# Patient Record
Sex: Male | Born: 1955 | Race: White | Hispanic: No | Smoking: Former smoker
Health system: Southern US, Community
[De-identification: ages and names within clinical notes are randomized; demographics above are authoritative.]

## PROBLEM LIST (undated history)

## (undated) DIAGNOSIS — I739 Peripheral vascular disease, unspecified: Secondary | ICD-10-CM

## (undated) DIAGNOSIS — N529 Male erectile dysfunction, unspecified: Secondary | ICD-10-CM

## (undated) DIAGNOSIS — E785 Hyperlipidemia, unspecified: Secondary | ICD-10-CM

## (undated) DIAGNOSIS — T7840XA Allergy, unspecified, initial encounter: Secondary | ICD-10-CM

## (undated) DIAGNOSIS — J301 Allergic rhinitis due to pollen: Secondary | ICD-10-CM

## (undated) DIAGNOSIS — I6522 Occlusion and stenosis of left carotid artery: Secondary | ICD-10-CM

## (undated) DIAGNOSIS — G459 Transient cerebral ischemic attack, unspecified: Secondary | ICD-10-CM

## (undated) DIAGNOSIS — I10 Essential (primary) hypertension: Secondary | ICD-10-CM

## (undated) HISTORY — PX: BACK SURGERY: SHX140

## (undated) HISTORY — DX: Male erectile dysfunction, unspecified: N52.9

## (undated) HISTORY — DX: Essential (primary) hypertension: I10

## (undated) HISTORY — PX: EYE SURGERY: SHX253

## (undated) HISTORY — DX: Hyperlipidemia, unspecified: E78.5

## (undated) HISTORY — DX: Allergy, unspecified, initial encounter: T78.40XA

## (undated) HISTORY — PX: FRACTURE SURGERY: SHX138

---

## 1976-04-22 HISTORY — PX: COSMETIC SURGERY: SHX468

## 2004-02-08 ENCOUNTER — Ambulatory Visit: Payer: Self-pay | Admitting: Family Medicine

## 2004-03-23 ENCOUNTER — Ambulatory Visit: Payer: Self-pay | Admitting: Family Medicine

## 2004-06-05 ENCOUNTER — Ambulatory Visit: Payer: Self-pay | Admitting: Family Medicine

## 2004-08-14 ENCOUNTER — Ambulatory Visit: Payer: Self-pay | Admitting: Family Medicine

## 2005-10-22 ENCOUNTER — Ambulatory Visit: Payer: Self-pay | Admitting: Internal Medicine

## 2006-01-27 ENCOUNTER — Ambulatory Visit: Payer: Self-pay | Admitting: Family Medicine

## 2006-03-05 ENCOUNTER — Ambulatory Visit: Payer: Self-pay | Admitting: Family Medicine

## 2006-08-05 ENCOUNTER — Encounter: Payer: Self-pay | Admitting: Family Medicine

## 2006-11-18 ENCOUNTER — Ambulatory Visit: Payer: Self-pay | Admitting: Family Medicine

## 2006-11-18 DIAGNOSIS — J019 Acute sinusitis, unspecified: Secondary | ICD-10-CM | POA: Insufficient documentation

## 2006-11-18 DIAGNOSIS — J309 Allergic rhinitis, unspecified: Secondary | ICD-10-CM | POA: Insufficient documentation

## 2007-02-02 ENCOUNTER — Telehealth (INDEPENDENT_AMBULATORY_CARE_PROVIDER_SITE_OTHER): Payer: Self-pay | Admitting: *Deleted

## 2007-02-19 ENCOUNTER — Ambulatory Visit: Payer: Self-pay | Admitting: Family Medicine

## 2007-02-25 ENCOUNTER — Telehealth: Payer: Self-pay | Admitting: Family Medicine

## 2007-03-31 ENCOUNTER — Telehealth: Payer: Self-pay | Admitting: Family Medicine

## 2007-04-07 ENCOUNTER — Ambulatory Visit: Payer: Self-pay | Admitting: Family Medicine

## 2007-06-03 ENCOUNTER — Telehealth: Payer: Self-pay | Admitting: Family Medicine

## 2008-03-16 ENCOUNTER — Ambulatory Visit: Payer: Self-pay | Admitting: Family Medicine

## 2008-03-16 DIAGNOSIS — J029 Acute pharyngitis, unspecified: Secondary | ICD-10-CM | POA: Insufficient documentation

## 2008-03-16 LAB — CONVERTED CEMR LAB: Rapid Strep: NEGATIVE

## 2008-04-06 ENCOUNTER — Ambulatory Visit: Payer: Self-pay | Admitting: Family Medicine

## 2008-04-06 LAB — CONVERTED CEMR LAB
OCCULT 1: NEGATIVE
OCCULT 2: NEGATIVE
OCCULT 3: NEGATIVE

## 2009-02-23 ENCOUNTER — Ambulatory Visit: Payer: Self-pay | Admitting: Family Medicine

## 2009-02-23 LAB — CONVERTED CEMR LAB
Bilirubin Urine: NEGATIVE
Glucose, Urine, Semiquant: NEGATIVE
Ketones, urine, test strip: NEGATIVE
Nitrite: NEGATIVE
Protein, U semiquant: NEGATIVE
Specific Gravity, Urine: 1.015
Urobilinogen, UA: 0.2
WBC Urine, dipstick: NEGATIVE
pH: 6.5

## 2009-02-24 LAB — CONVERTED CEMR LAB
ALT: 44 units/L (ref 0–53)
AST: 34 units/L (ref 0–37)
Albumin: 4.1 g/dL (ref 3.5–5.2)
Alkaline Phosphatase: 75 units/L (ref 39–117)
BUN: 8 mg/dL (ref 6–23)
Basophils Absolute: 0 10*3/uL (ref 0.0–0.1)
Basophils Relative: 0.8 % (ref 0.0–3.0)
Bilirubin, Direct: 0 mg/dL (ref 0.0–0.3)
CO2: 28 meq/L (ref 19–32)
Calcium: 9 mg/dL (ref 8.4–10.5)
Chloride: 108 meq/L (ref 96–112)
Cholesterol: 192 mg/dL (ref 0–200)
Creatinine, Ser: 0.9 mg/dL (ref 0.4–1.5)
Eosinophils Absolute: 0.2 10*3/uL (ref 0.0–0.7)
Eosinophils Relative: 2.9 % (ref 0.0–5.0)
GFR calc non Af Amer: 93.49 mL/min (ref >60)
Glucose, Bld: 93 mg/dL (ref 70–99)
HCT: 44.8 % (ref 39.0–52.0)
HDL: 43.3 mg/dL (ref >39.00)
Hemoglobin: 15.3 g/dL (ref 13.0–17.0)
LDL Cholesterol: 131 mg/dL — ABNORMAL HIGH (ref 0–99)
Lymphocytes Relative: 35.8 % (ref 12.0–46.0)
Lymphs Abs: 1.9 10*3/uL (ref 0.7–4.0)
MCHC: 34.1 g/dL (ref 30.0–36.0)
MCV: 93.9 fL (ref 78.0–100.0)
Monocytes Absolute: 0.6 10*3/uL (ref 0.1–1.0)
Monocytes Relative: 11 % (ref 3.0–12.0)
Neutro Abs: 2.6 10*3/uL (ref 1.4–7.7)
Neutrophils Relative %: 49.5 % (ref 43.0–77.0)
PSA: 1.23 ng/mL (ref 0.10–4.00)
Platelets: 227 10*3/uL (ref 150.0–400.0)
Potassium: 4 meq/L (ref 3.5–5.1)
RBC: 4.77 M/uL (ref 4.22–5.81)
RDW: 12.4 % (ref 11.5–14.6)
Sodium: 142 meq/L (ref 135–145)
TSH: 1.19 microintl units/mL (ref 0.35–5.50)
Total Bilirubin: 0.8 mg/dL (ref 0.3–1.2)
Total CHOL/HDL Ratio: 4
Total Protein: 7.2 g/dL (ref 6.0–8.3)
Triglycerides: 90 mg/dL (ref 0.0–149.0)
VLDL: 18 mg/dL (ref 0.0–40.0)
WBC: 5.3 10*3/uL (ref 4.5–10.5)

## 2009-03-08 ENCOUNTER — Ambulatory Visit: Payer: Self-pay | Admitting: Family Medicine

## 2009-04-11 ENCOUNTER — Ambulatory Visit: Payer: Self-pay | Admitting: Family Medicine

## 2009-04-11 DIAGNOSIS — J069 Acute upper respiratory infection, unspecified: Secondary | ICD-10-CM | POA: Insufficient documentation

## 2009-04-11 LAB — CONVERTED CEMR LAB: Rapid Strep: NEGATIVE

## 2009-07-07 ENCOUNTER — Encounter (INDEPENDENT_AMBULATORY_CARE_PROVIDER_SITE_OTHER): Payer: Self-pay | Admitting: *Deleted

## 2009-08-15 ENCOUNTER — Encounter (INDEPENDENT_AMBULATORY_CARE_PROVIDER_SITE_OTHER): Payer: Self-pay | Admitting: *Deleted

## 2009-08-25 ENCOUNTER — Encounter (INDEPENDENT_AMBULATORY_CARE_PROVIDER_SITE_OTHER): Payer: Self-pay | Admitting: *Deleted

## 2009-08-28 ENCOUNTER — Ambulatory Visit: Payer: Self-pay | Admitting: Gastroenterology

## 2009-09-13 ENCOUNTER — Ambulatory Visit: Payer: Self-pay | Admitting: Gastroenterology

## 2009-09-13 HISTORY — PX: COLONOSCOPY: SHX174

## 2010-05-22 NOTE — Procedures (Signed)
Summary: Colonoscopy  Patient: [name] Note: All result statuses are Final unless otherwise noted.  Tests: (1) Colonoscopy (COL)   COL Colonoscopy           DONE     [facility]     [address]     [place], [place]  [address]           COLONOSCOPY PROCEDURE REPORT           PATIENT:  Dean Alvarado, Dean Alvarado  MR#:  [id number]     BIRTHDATE:  16-Nov-1955, 53 yrs. old  GENDER:  male     ENDOSCOPIST:  Vania Rea. Jarold Motto, MD, [facility]     REF. BY:  Tera Mater. Clent Ridges, M.D.     PROCEDURE DATE:  09/13/2009     PROCEDURE:  Average-risk screening colonoscopy     G0121     ASA CLASS:  Class I     INDICATIONS:  colorectal cancer screening, average risk     MEDICATIONS:   Fentanyl 75 mcg IV, Versed 8 mg IV           DESCRIPTION OF PROCEDURE:   After the risks benefits and     alternatives of the procedure were thoroughly explained, informed     consent was obtained.  Digital rectal exam was performed and     revealed no abnormalities.   The [id number] endoscope was     introduced through the anus and advanced to the cecum, which was     identified by both the appendix and ileocecal valve, without     limitations.  The quality of the prep was excellent, using     MoviPrep.  The instrument was then slowly withdrawn as the colon     was fully examined.     <<PROCEDUREIMAGES>>           FINDINGS:  Moderate diverticulosis was found in the sigmoid to     descending colon segments. thickened,red haustral folds noted.  No     polyps or cancers were seen.  This was otherwise a normal     examination of the colon.   Retroflexed views in the rectum     revealed no abnormalities.    The scope was then withdrawn from     the patient and the procedure completed.           COMPLICATIONS:  None     ENDOSCOPIC IMPRESSION:     1) Moderate diverticulosis in the sigmoid to descending colon     segments     2) No polyps or cancers     3) Otherwise normal examination     RECOMMENDATIONS:     1) high  fiber diet     2) Continue current colorectal screening recommendations for     "routine risk" patients with a repeat colonoscopy in 10 years.     REPEAT EXAM:  No           ______________________________     Vania Rea. Jarold Motto, MD, [facility]           CC:           n.     eSIGNED:   Vania Rea. Maryetta Shafer at 09/13/2009 10:13 AM           Sharene Skeans, [id number]  Note: An exclamation mark (!) indicates a result that was not dispersed into the flowsheet. Document Creation Date: 09/13/2009 10:14 AM _______________________________________________________________________  (1) Order result status: Final Collection  or observation date-time: 09/13/2009 10:08 Requested date-time:  Receipt date-time:  Reported date-time:  Referring Physician:   Ordering Physician: Sheryn Bison [phone]) Specimen Source:  Source: Launa Grill Order Number: [id number] Lab site:   Appended Document: Colonoscopy    Clinical Lists Changes  Observations: Added new observation of COLONNXTDUE: 08/2019 (09/13/2009 14:44)

## 2010-05-22 NOTE — Letter (Signed)
Summary: Previsit letter  [name]  [address] [place], [place] [address]   Phone: [phone]  Fax: [phone]       08/15/2009 MRN: [id number]  Dean Alvarado [address]  [place] [address] [place], [place]  [address]  Dear Mr. [name],  Welcome to the Gastroenterology Division at [facility].    You are scheduled to see a nurse for your pre-procedure visit on 08/28/2009 at 1:00PM on the 3rd floor at [facility], [address]  We ask that you try to arrive at our office 15 minutes prior to your appointment time to allow for check-in.  Your nurse visit will consist of discussing your medical and surgical history, your immediate family medical history, and your medications.    Please bring a complete list of all your medications or, if you prefer, bring the medication bottles and we will list them.  We will need to be aware of both prescribed and over the counter drugs.  We will need to know exact dosage information as well.  If you are on blood thinners (Coumadin, Plavix, Aggrenox, Ticlid, etc.) please call our office today/prior to your appointment, as we need to consult with your physician about holding your medication.   Please be prepared to read and sign documents such as consent forms, a financial agreement, and acknowledgement forms.  If necessary, and with your consent, a friend or relative is welcome to sit-in on the nurse visit with you.  Please bring your insurance card so that we may make a copy of it.  If your insurance requires a referral to see a specialist, please bring your referral form from your primary care physician.  No co-pay is required for this nurse visit.     If you cannot keep your appointment, please call [phone] to cancel or reschedule prior to your appointment date.  This allows [place] the opportunity to schedule an appointment for another patient in need of care.    Thank you for choosing [facility] for your medical needs.   We appreciate the opportunity to care for you.  Please visit [place] at our website  to learn more about our practice.                     Sincerely.                                                                                                                   The Gastroenterology Division

## 2010-05-22 NOTE — Miscellaneous (Signed)
Summary: [facility]  Clinical Lists Changes  Medications: Added new medication of MOVIPREP 100 GM  SOLR (PEG-KCL-NACL-NASULF-NA ASC-C) As per prep instructions. - Signed Rx of MOVIPREP 100 GM  SOLR (PEG-KCL-NACL-NASULF-NA ASC-C) As per prep instructions.;  #1 x 0;  Signed;  Entered by: Ezra Sites RN;  Authorized by: Mardella Layman MD [facility];  Method used: Electronically to [facility] [address] [facility] [address] # [phone]*, [address] [place], [place]  [address], Ph: [phone], Fax: [phone] Observations: Added new observation of NKA: T (08/28/2009 13:00)    Prescriptions: MOVIPREP 100 GM  SOLR (PEG-KCL-NACL-NASULF-NA ASC-C) As per prep instructions.  #1 x 0   Entered by:   Ezra Sites RN   Authorized by:   Mardella Layman MD [facility]   Signed by:   Ezra Sites RN on 08/28/2009   Method used:   Electronically to        [facility] # [phone]* (retail)       [address]       [place], [place]  [address]       Ph: [phone]       Fax: [phone]   RxID:   [id number]

## 2010-05-22 NOTE — Letter (Signed)
Summary: Referral - not able to see patient  [facility]  [address] [place], [place] [address]   Phone: [phone]  Fax: [phone]    July 07, 2009    Tera Mater. Clent Ridges, M.D. [address] [place], [place] [address]   Re:   RUARI MUDGETT DOB:  March 18, 1956 MRN:   [id number]    Dear Dr. Clent Ridges:  Thank you for your kind referral of the above patient.  We have attempted to schedule the recommended procedure Screening Colonoscopy but have not been able to schedule because:  ___ The patient was not available by phone and/or has not returned our calls.   X  The patient declined to schedule the procedure at this time.  We appreciate the referral and hope that we will have the opportunity to treat this patient in the future.    Sincerely,    [facility] [phone]

## 2010-05-22 NOTE — Letter (Signed)
Summary: [facility] Instructions  [facility]  [address] [place], [place] [address]   Phone: [phone]  Fax: [phone]       Dean Alvarado    11/09/1955    MRN: [id number]        Procedure Day Dorna Bloom:  Wednesday 09/13/2009     Arrival Time: 8:30 am      Procedure Time: 9:30 am     Location of Procedure:                    _x _  [facility] (4th Floor)                        PREPARATION FOR COLONOSCOPY WITH MOVIPREP   Starting 5 days prior to your procedure Friday 5/20 do not eat nuts, seeds, popcorn, corn, beans, peas,  salads, or any raw vegetables.  Do not take any fiber supplements (e.g. Metamucil, Citrucel, and Benefiber).  THE DAY BEFORE YOUR PROCEDURE         DATE: Tuesday 5/24  1.  Drink clear liquids the entire day-NO SOLID FOOD  2.  Do not drink anything colored red or purple.  Avoid juices with pulp.  No orange juice.  3.  Drink at least 64 oz. (8 glasses) of fluid/clear liquids during the day to prevent dehydration and help the prep work efficiently.  CLEAR LIQUIDS INCLUDE: Water Jello Ice Popsicles Tea (sugar ok, no milk/cream) Powdered fruit flavored drinks Coffee (sugar ok, no milk/cream) Gatorade Juice: apple, white grape, white cranberry  Lemonade Clear bullion, consomm, broth Carbonated beverages (any kind) Strained chicken noodle soup Hard Candy                             4.  In the morning, mix first dose of MoviPrep solution:    Empty 1 Pouch A and 1 Pouch B into the disposable container    Add lukewarm drinking water to the top line of the container. Mix to dissolve    Refrigerate (mixed solution should be used within 24 hrs)  5.  Begin drinking the prep at 5:00 p.m. The MoviPrep container is divided by 4 marks.   Every 15 minutes drink the solution down to the next mark (approximately 8 oz) until the full liter is complete.   6.  Follow completed prep with 16 oz of clear liquid of your choice (Nothing  red or purple).  Continue to drink clear liquids until bedtime.  7.  Before going to bed, mix second dose of MoviPrep solution:    Empty 1 Pouch A and 1 Pouch B into the disposable container    Add lukewarm drinking water to the top line of the container. Mix to dissolve    Refrigerate  THE DAY OF YOUR PROCEDURE      DATE: Wednesday 5/25  Beginning at 4:30 a.m. (5 hours before procedure):         1. Every 15 minutes, drink the solution down to the next mark (approx 8 oz) until the full liter is complete.  2. Follow completed prep with 16 oz. of clear liquid of your choice.    3. You may drink clear liquids until 7:30 am (2 HOURS BEFORE PROCEDURE).   MEDICATION INSTRUCTIONS  Unless otherwise instructed, you should take regular prescription medications with a small sip of water   as early as possible the morning of  your procedure.           OTHER INSTRUCTIONS  You will need a responsible adult at least 56 years of age to accompany you and drive you home.   This person must remain in the waiting room during your procedure.  Wear loose fitting clothing that is easily removed.  Leave jewelry and other valuables at home.  However, you may wish to bring a book to read or  an iPod/MP3 player to listen to music as you wait for your procedure to start.  Remove all body piercing jewelry and leave at home.  Total time from sign-in until discharge is approximately 2-3 hours.  You should go home directly after your procedure and rest.  You can resume normal activities the  day after your procedure.  The day of your procedure you should not:   Drive   Make legal decisions   Operate machinery   Drink alcohol   Return to work  You will receive specific instructions about eating, activities and medications before you leave.    The above instructions have been reviewed and explained to me by   Ezra Sites RN  Aug 28, 2009 1:15 PM     I fully understand and can  verbalize these instructions _____________________________ Date _________

## 2010-06-12 ENCOUNTER — Other Ambulatory Visit: Payer: Self-pay

## 2010-06-12 NOTE — Telephone Encounter (Signed)
Notified [facility]  --pt had rx there with 5 refills spoke with pharmacist stated he had this rx for [facility]epam [facility] and now [facility] [place] is requesting new rx

## 2010-06-13 MED ORDER — LORAZEPAM 1 MG PO TABS: 1.0000 mg | ORAL_TABLET | Freq: Four times a day (QID) | ORAL | Status: DC | PRN

## 2010-06-13 NOTE — Telephone Encounter (Signed)
Lorazepam called in for 6 months [facility]

## 2010-06-13 NOTE — Telephone Encounter (Signed)
Please call in a new 6 month supply

## 2010-09-07 NOTE — Assessment & Plan Note (Signed)
[facility] OFFICE NOTE   NAME:Dean Alvarado, Dean Alvarado                      MRN:          [id number]  DATE:01/27/2006                            DOB:          [birth year]    This is a 55 year old gentleman here to establish with our practice.  He has  a couple of complaints.  He had been seeing Dr. Lutricia Horsfall at our [facility] office, but is now transferring here.  First off, he is asking for  refills on his allergy medications.  These have been working well over the  past year.  Also, he thinks he has developed a small sty in his right upper  eyelid.  This first appeared 5 days ago, but was never painful.  It now  seems to be getting smaller over the past couple of days.  He does not wear  contact lenses.   PAST MEDICAL HISTORY:  Otherwise fairly unremarkable.  He has never had a  significant illness.  He has never had surgery.   IMMUNIZATIONS:  He had a tetanus booster in 2005.   ALLERGIES:  None.   CURRENT MEDICATIONS:  1. Nasonex sprays daily.  2. Claritin daily.   HABITS:  He drinks some alcohol.  He does not use tobacco.   SOCIAL HISTORY:  Married with children.  He is a Psychologist, occupational.   FAMILY HISTORY:  Remarkable for hypertension and diabetes.   OBJECTIVE:  VITAL SIGNS:  Height 5 feet, 9 inches, weight 183, blood  pressure 118/88, pulse 72 and regular.  GENERAL:  He appears to be healthy.  He wears glasses.  HEENT:  Examination was limited to his right upper eyelid, which, in fact,  does have a tiny sty along the eyelash line.  There is no redness, swelling  or tenderness.   ASSESSMENT AND PLAN:  1. Wellness. The patient is past due for a physical exam.  We will plan to      set this up sometime soon.  Will also get a colonoscopy set up for him      shortly thereafter.  2. Allergies, stable.  I refilled his medications.  3. Sty.  He can use hot compresses.  At this point, no antibiotic therapy     appears to be needed but he will let me know if it gets worse.            ______________________________  Tera Mater. Clent Ridges, MD     SAF/MedQ  DD:  01/27/2006  DT:  01/28/2006  Job #:  [id number]

## 2010-09-07 NOTE — Assessment & Plan Note (Signed)
[facility] OFFICE NOTE   NAME:Slinker, Dean Alvarado                      MRN:          [id number]  DATE:03/05/2006                            DOB:          1955-08-28    This is a 55 year old gentleman here for a complete physical  examination.  He feels fine and has no complaints at all.  His allergies  remain under good control.  I had an introductory visit with him on  October 8.  I refer to this note concerning details of his past medical  history, family history, social history, habits, etc.  The patient had  complete lab work performed in March of this year including a PSA.  All  of this was within normal limits and we reviewed these at our last  visit.   ALLERGIES:  None.   CURRENT MEDICATIONS:  Nasonex spray daily and Claritin daily.   OBJECTIVE:  VITAL SIGNS:  Height 5 feet 9 inches, weight 186, BP 122/90,  pulse 72 and regular.  GENERAL:  He appears to be healthy.  SKIN:  Clear.  HEENT:  Eyes are clear; he wears glasses.  Ears clear, pharynx clear.  NECK:  Supple without lymphadenopathy or masses.  LUNGS:  Clear.  CARDIAC:  Rate and rhythm are regular without gallops, murmurs, rubs.  Distal pulses are full.  EKG is within normal limits.  ABDOMEN:  Soft, normal bowel sounds, nontender, no masses.  GENITALIA:  Normal male.  He is circumcised.  RECTAL:  No mass or tenderness.  Prostate is within normal limits.  Stool hemoccult negative.  EXTREMITIES:  No clubbing, cyanosis, or edema.  NEUROLOGIC:  Grossly intact.   ASSESSMENT AND PLAN:  Complete physical exam.  In general he appears to  be doing quite well.  I encouraged him to get regular exercise.  He was  given a flu shot today and will set him up for his first colonoscopy.     Tera Mater. Clent Ridges, MD  Electronically Signed    SAF/MedQ  DD: 03/05/2006  DT: 03/05/2006  Job #: [id number]

## 2010-09-21 ENCOUNTER — Other Ambulatory Visit: Payer: Self-pay | Admitting: Family Medicine

## 2010-09-23 NOTE — Telephone Encounter (Signed)
Call in #60 with 5 rf 

## 2010-09-25 NOTE — Telephone Encounter (Signed)
Rx called to pharmacy to [facility]

## 2010-09-27 ENCOUNTER — Telehealth: Payer: Self-pay | Admitting: *Deleted

## 2010-09-27 NOTE — Telephone Encounter (Signed)
Refill on lorazepam 1 mg

## 2010-09-28 NOTE — Telephone Encounter (Signed)
Left message on machine for pharmacy

## 2010-09-28 NOTE — Telephone Encounter (Signed)
NO refills until he sees me

## 2011-01-03 ENCOUNTER — Other Ambulatory Visit: Payer: Self-pay | Admitting: Family Medicine

## 2011-01-03 NOTE — Telephone Encounter (Signed)
Refill request for lorazepam 1 mg take 1 po q6hrs prn. Pt has agreed to schedule a office visit for future refills. It looks like he is due for one.

## 2011-01-03 NOTE — Telephone Encounter (Signed)
LOV 2010 NOV none

## 2011-01-08 NOTE — Telephone Encounter (Signed)
Pt called back again in regards to prescription please contact pt at [phone]

## 2011-01-09 MED ORDER — LORAZEPAM 1 MG PO TABS: 1.0000 mg | ORAL_TABLET | Freq: Four times a day (QID) | ORAL | Status: DC | PRN

## 2011-01-09 NOTE — Telephone Encounter (Signed)
Call in #60 with no rf 

## 2011-01-09 NOTE — Telephone Encounter (Signed)
Pt called to check on status of refill of Lorazepam.

## 2011-01-09 NOTE — Telephone Encounter (Signed)
Pts wife called to check on satus of getting refill of Lorazepam 1 mg take 1 po q6hrs prn. Pls call in to [facility]. Original req was on 01/03/11. Pt has sch an ov for 01/25/11.

## 2011-01-09 NOTE — Telephone Encounter (Signed)
Script called in

## 2011-01-25 ENCOUNTER — Ambulatory Visit: Payer: Self-pay | Admitting: Family Medicine

## 2011-11-18 ENCOUNTER — Ambulatory Visit (INDEPENDENT_AMBULATORY_CARE_PROVIDER_SITE_OTHER): Payer: BC Managed Care – PPO | Admitting: Internal Medicine

## 2011-11-18 ENCOUNTER — Encounter: Payer: Self-pay | Admitting: Internal Medicine

## 2011-11-18 VITALS — BP 142/102 | Temp 98.5°F | Ht 68.5 in | Wt 207.0 lb

## 2011-11-18 DIAGNOSIS — H6120 Impacted cerumen, unspecified ear: Secondary | ICD-10-CM

## 2011-11-18 NOTE — Progress Notes (Signed)
  Subjective:    Patient ID: Dean Alvarado, male    DOB: Sep 23, 1955, 56 y.o.   MRN: [id number]  HPI   56 year old white male with history of periodic cerumen impactions complains of fullness of the left ear.  He also complains of slightly sensation.   He denies significant hearing loss.      Review of Systems See HPI  Past Medical History  Diagnosis Date  . Allergy     History   Social History  . Marital Status: Married    Spouse Name: N/A    Number of Children: N/A  . Years of Education: N/A   Occupational History  . Not on file.   Social History Main Topics  . Smoking status: Current Everyday Smoker  . Smokeless tobacco: Not on file  . Alcohol Use: Yes  . Drug Use: No  . Sexually Active: Not on file   Other Topics Concern  . Not on file   Social History Narrative  . No narrative on file    Past Surgical History  Procedure Date  . Plastic surgery left eye     Family History  Problem Relation Age of Onset  . Diabetes    . Hypertension      No Known Allergies  Current Outpatient Prescriptions on File Prior to Visit  Medication Sig Dispense Refill  . LORazepam (ATIVAN) 1 MG tablet Take 1 tablet (1 mg total) by mouth every 6 (six) hours as needed for anxiety.  60 tablet  0    BP 142/102  Temp 98.5 F (36.9 C) (Oral)  Ht 5' 8.5" (1.74 m)  Wt 207 lb (93.895 kg)  BMI 31.02 kg/m2       Objective:   Physical Exam  Constitutional: He appears well-developed and well-nourished.  HENT:  Head: Normocephalic and atraumatic.        Bilateral cerumen impaction  Cardiovascular: Normal rate, regular rhythm and normal heart sounds.   Pulmonary/Chest: Effort normal and breath sounds normal. He has no wheezes.          Assessment & Plan:

## 2011-11-18 NOTE — Assessment & Plan Note (Signed)
Patient has bilateral cerumen impaction. Irrigated both ears to remove cerumen plug. Patient tolerated well. No complications.

## 2011-11-18 NOTE — Patient Instructions (Addendum)
Use over the counter ear cleaning kit as directed.

## 2012-01-21 ENCOUNTER — Other Ambulatory Visit (INDEPENDENT_AMBULATORY_CARE_PROVIDER_SITE_OTHER): Payer: BC Managed Care – PPO

## 2012-01-21 DIAGNOSIS — Z0000 Encounter for general adult medical examination without abnormal findings: Secondary | ICD-10-CM

## 2012-01-21 LAB — CBC WITH DIFFERENTIAL/PLATELET
Basophils Absolute: 0 10*3/uL (ref 0.0–0.1)
Basophils Relative: 0.9 % (ref 0.0–3.0)
Eosinophils Absolute: 0.1 10*3/uL (ref 0.0–0.7)
Eosinophils Relative: 2.6 % (ref 0.0–5.0)
HCT: 44.7 % (ref 39.0–52.0)
Hemoglobin: 14.9 g/dL (ref 13.0–17.0)
Lymphocytes Relative: 28.9 % (ref 12.0–46.0)
Lymphs Abs: 1.6 10*3/uL (ref 0.7–4.0)
MCHC: 33.4 g/dL (ref 30.0–36.0)
MCV: 93.3 fl (ref 78.0–100.0)
Monocytes Absolute: 0.6 10*3/uL (ref 0.1–1.0)
Monocytes Relative: 9.8 % (ref 3.0–12.0)
Neutro Abs: 3.2 10*3/uL (ref 1.4–7.7)
Neutrophils Relative %: 57.8 % (ref 43.0–77.0)
Platelets: 243 10*3/uL (ref 150.0–400.0)
RBC: 4.79 Mil/uL (ref 4.22–5.81)
RDW: 12.8 % (ref 11.5–14.6)
WBC: 5.6 10*3/uL (ref 4.5–10.5)

## 2012-01-21 LAB — POCT URINALYSIS DIPSTICK
Bilirubin, UA: NEGATIVE
Blood, UA: NEGATIVE
Glucose, UA: NEGATIVE
Ketones, UA: NEGATIVE
Leukocytes, UA: NEGATIVE
Nitrite, UA: NEGATIVE
Protein, UA: NEGATIVE
Spec Grav, UA: 1.01
Urobilinogen, UA: 0.2
pH, UA: 7

## 2012-01-21 LAB — BASIC METABOLIC PANEL
BUN: 18 mg/dL (ref 6–23)
CO2: 26 mEq/L (ref 19–32)
Calcium: 9 mg/dL (ref 8.4–10.5)
Chloride: 105 mEq/L (ref 96–112)
Creatinine, Ser: 0.8 mg/dL (ref 0.4–1.5)
GFR: 107.51 mL/min (ref >60.00)
Glucose, Bld: 115 mg/dL — ABNORMAL HIGH (ref 70–99)
Potassium: 4 mEq/L (ref 3.5–5.1)
Sodium: 138 mEq/L (ref 135–145)

## 2012-01-21 LAB — LIPID PANEL
Cholesterol: 198 mg/dL (ref 0–200)
HDL: 46.4 mg/dL (ref >39.00)
LDL Cholesterol: 128 mg/dL — ABNORMAL HIGH (ref 0–99)
Total CHOL/HDL Ratio: 4
Triglycerides: 120 mg/dL (ref 0.0–149.0)
VLDL: 24 mg/dL (ref 0.0–40.0)

## 2012-01-21 LAB — HEPATIC FUNCTION PANEL
ALT: 34 U/L (ref 0–53)
AST: 26 U/L (ref 0–37)
Albumin: 4 g/dL (ref 3.5–5.2)
Alkaline Phosphatase: 79 U/L (ref 39–117)
Bilirubin, Direct: 0.2 mg/dL (ref 0.0–0.3)
Total Bilirubin: 1 mg/dL (ref 0.3–1.2)
Total Protein: 7.1 g/dL (ref 6.0–8.3)

## 2012-01-21 LAB — TSH: TSH: 1.17 u[IU]/mL (ref 0.35–5.50)

## 2012-01-21 LAB — PSA: PSA: 1.06 ng/mL (ref 0.10–4.00)

## 2012-01-22 NOTE — Progress Notes (Signed)
Quick Note:  I left voice message with results. ______ 

## 2012-01-28 ENCOUNTER — Encounter: Payer: Self-pay | Admitting: Family Medicine

## 2012-01-28 ENCOUNTER — Ambulatory Visit (INDEPENDENT_AMBULATORY_CARE_PROVIDER_SITE_OTHER): Payer: BC Managed Care – PPO | Admitting: Family Medicine

## 2012-01-28 VITALS — BP 124/90 | HR 80 | Temp 98.7°F | Ht 68.5 in | Wt 202.0 lb

## 2012-01-28 DIAGNOSIS — Z0000 Encounter for general adult medical examination without abnormal findings: Secondary | ICD-10-CM

## 2012-01-28 DIAGNOSIS — Z23 Encounter for immunization: Secondary | ICD-10-CM

## 2012-01-28 MED ORDER — LORAZEPAM 1 MG PO TABS: 1.0000 mg | ORAL_TABLET | Freq: Four times a day (QID) | ORAL | Status: DC | PRN

## 2012-01-28 NOTE — Progress Notes (Signed)
  Subjective:    Patient ID: Dean Alvarado, male    DOB: 1955-08-14, 56 y.o.   MRN: [id number]  HPI 56 yr old male for a cpx. He feels well and has no concerns.    Review of Systems  Constitutional: Negative.   HENT: Negative.   Eyes: Negative.   Respiratory: Negative.   Cardiovascular: Negative.   Gastrointestinal: Negative.   Genitourinary: Negative.   Musculoskeletal: Negative.   Skin: Negative.   Neurological: Negative.   Hematological: Negative.   Psychiatric/Behavioral: Negative.        Objective:   Physical Exam  Constitutional: He is oriented to person, place, and time. He appears well-developed and well-nourished. No distress.  HENT:  Head: Normocephalic and atraumatic.  Right Ear: External ear normal.  Left Ear: External ear normal.  Nose: Nose normal.  Mouth/Throat: Oropharynx is clear and moist. No oropharyngeal exudate.  Eyes: Conjunctivae normal and EOM are normal. Pupils are equal, round, and reactive to light. Right eye exhibits no discharge. Left eye exhibits no discharge. No scleral icterus.  Neck: Neck supple. No JVD present. No tracheal deviation present. No thyromegaly present.  Cardiovascular: Normal rate, regular rhythm, normal heart sounds and intact distal pulses.  Exam reveals no gallop and no friction rub.   No murmur heard.      EKG normal with a single PVC  Pulmonary/Chest: Effort normal and breath sounds normal. No respiratory distress. He has no wheezes. He has no rales. He exhibits no tenderness.  Abdominal: Soft. Bowel sounds are normal. He exhibits no distension and no mass. There is no tenderness. There is no rebound and no guarding.  Genitourinary: Rectum normal, prostate normal and penis normal. Guaiac negative stool. No penile tenderness.  Musculoskeletal: Normal range of motion. He exhibits no edema and no tenderness.  Lymphadenopathy:    He has no cervical adenopathy.  Neurological: He is alert and oriented to person, place, and  time. He has normal reflexes. No cranial nerve deficit. He exhibits normal muscle tone. Coordination normal.  Skin: Skin is warm and dry. No rash noted. He is not diaphoretic. No erythema. No pallor.  Psychiatric: He has a normal mood and affect. His behavior is normal. Judgment and thought content normal.          Assessment & Plan:  Well exam.

## 2012-08-18 ENCOUNTER — Other Ambulatory Visit: Payer: Self-pay | Admitting: Family Medicine

## 2012-08-18 NOTE — Telephone Encounter (Signed)
Call in #60 with 5 rf 

## 2013-01-12 ENCOUNTER — Encounter: Payer: Self-pay | Admitting: Family Medicine

## 2013-01-12 ENCOUNTER — Ambulatory Visit (INDEPENDENT_AMBULATORY_CARE_PROVIDER_SITE_OTHER): Payer: BC Managed Care – PPO | Admitting: Family Medicine

## 2013-01-12 VITALS — BP 142/100 | HR 76 | Temp 98.4°F | Wt 205.0 lb

## 2013-01-12 DIAGNOSIS — R109 Unspecified abdominal pain: Secondary | ICD-10-CM

## 2013-01-12 LAB — POCT URINALYSIS DIPSTICK
Bilirubin, UA: NEGATIVE
Glucose, UA: NEGATIVE
Ketones, UA: NEGATIVE
Leukocytes, UA: NEGATIVE
Nitrite, UA: NEGATIVE
Spec Grav, UA: 1.02
Urobilinogen, UA: 0.2
pH, UA: 6.5

## 2013-01-12 NOTE — Progress Notes (Signed)
  Subjective:    Patient ID: Dean Alvarado, male    DOB: 1955-04-30, 57 y.o.   MRN: [id number]  HPI Here for a dull pain in the right flank that started 3 weeks ago. It waxes and wanes but is constant. He rates it a 3 out of 10 as far as severity. No other sx at all, no change in BMs or urinations. No nausea or fever. He does work out at [employer] by running on a treadmill but he does not lift weights. Advil helps.    Review of Systems  Constitutional: Negative.   Respiratory: Negative.   Cardiovascular: Negative.   Gastrointestinal: Negative.   Genitourinary: Positive for flank pain. Negative for dysuria, urgency, frequency, hematuria and difficulty urinating.       Objective:   Physical Exam  Constitutional: He appears well-developed and well-nourished. No distress.  Cardiovascular: Normal rate, regular rhythm, normal heart sounds and intact distal pulses.   Pulmonary/Chest: Effort normal and breath sounds normal.  Abdominal: Soft. Bowel sounds are normal. He exhibits no distension and no mass. There is no tenderness. There is no rebound and no guarding.  Musculoskeletal: Normal range of motion. He exhibits no edema and no tenderness.  I cannot elicit any tenderness           Assessment & Plan:  Flank pain of unclear etiology, probably muscular. Try heat and Advil prn. He will return in 3-4 weeks and we will address this again at that time.

## 2013-01-12 NOTE — Addendum Note (Signed)
Addended by: Aniceto Boss A on: 01/12/2013 11:17 AM   Modules accepted: Orders

## 2013-03-23 ENCOUNTER — Encounter: Payer: Self-pay | Admitting: Family Medicine

## 2013-03-23 ENCOUNTER — Ambulatory Visit (INDEPENDENT_AMBULATORY_CARE_PROVIDER_SITE_OTHER): Payer: BC Managed Care – PPO | Admitting: Family Medicine

## 2013-03-23 VITALS — BP 140/96 | HR 88 | Temp 98.3°F | Ht 68.0 in | Wt 199.0 lb

## 2013-03-23 DIAGNOSIS — R7309 Other abnormal glucose: Secondary | ICD-10-CM

## 2013-03-23 DIAGNOSIS — Z23 Encounter for immunization: Secondary | ICD-10-CM

## 2013-03-23 DIAGNOSIS — R739 Hyperglycemia, unspecified: Secondary | ICD-10-CM | POA: Insufficient documentation

## 2013-03-23 DIAGNOSIS — Z0000 Encounter for general adult medical examination without abnormal findings: Secondary | ICD-10-CM

## 2013-03-23 LAB — PSA: PSA: 1.27 ng/mL (ref 0.10–4.00)

## 2013-03-23 LAB — HEMOGLOBIN A1C: Hgb A1c MFr Bld: 5.9 % (ref 4.6–6.5)

## 2013-03-23 MED ORDER — LORAZEPAM 1 MG PO TABS: ORAL_TABLET | ORAL | Status: DC

## 2013-03-23 NOTE — Progress Notes (Signed)
   Subjective:    Patient ID: Dean Alvarado, male    DOB: Feb 22, 1956, 57 y.o.   MRN: [id number]  HPI 57 yr old male for a cpx. He feels well in general but he has been under a lot of stress lately. He and his wife have been separated and the divorce will probably be finalized in the next 6 months. His estranged wife plans to move to [place]. He thinks this is why his BP is up a little. He had recent labs during a health fair at his job, and his fasting glucose has gone up to around 120.    Review of Systems  Constitutional: Negative.   HENT: Negative.   Eyes: Negative.   Respiratory: Negative.   Cardiovascular: Negative.   Gastrointestinal: Negative.   Genitourinary: Negative.   Musculoskeletal: Negative.   Skin: Negative.   Neurological: Negative.   Psychiatric/Behavioral: Negative.        Objective:   Physical Exam  Constitutional: He is oriented to person, place, and time. He appears well-developed and well-nourished. No distress.  HENT:  Head: Normocephalic and atraumatic.  Right Ear: External ear normal.  Left Ear: External ear normal.  Nose: Nose normal.  Mouth/Throat: Oropharynx is clear and moist. No oropharyngeal exudate.  Eyes: Conjunctivae and EOM are normal. Pupils are equal, round, and reactive to light. Right eye exhibits no discharge. Left eye exhibits no discharge. No scleral icterus.  Neck: Neck supple. No JVD present. No tracheal deviation present. No thyromegaly present.  Cardiovascular: Normal rate, regular rhythm, normal heart sounds and intact distal pulses.  Exam reveals no gallop and no friction rub.   No murmur heard. EKG normal   Pulmonary/Chest: Effort normal and breath sounds normal. No respiratory distress. He has no wheezes. He has no rales. He exhibits no tenderness.  Abdominal: Soft. Bowel sounds are normal. He exhibits no distension and no mass. There is no tenderness. There is no rebound and no guarding.  Genitourinary: Rectum normal, prostate  normal and penis normal. Guaiac negative stool. No penile tenderness.  Musculoskeletal: Normal range of motion. He exhibits no edema and no tenderness.  Lymphadenopathy:    He has no cervical adenopathy.  Neurological: He is alert and oriented to person, place, and time. He has normal reflexes. No cranial nerve deficit. He exhibits normal muscle tone. Coordination normal.  Skin: Skin is warm and dry. No rash noted. He is not diaphoretic. No erythema. No pallor.  Psychiatric: He has a normal mood and affect. His behavior is normal. Judgment and thought content normal.          Assessment & Plan:  Well exam. Get a PSA and A1c today. We discussed getting exercise and eating smarter to lose weight.

## 2013-03-23 NOTE — Progress Notes (Signed)
Pre visit review using our clinic review tool, if applicable. No additional management support is needed unless otherwise documented below in the visit note. 

## 2013-09-22 ENCOUNTER — Other Ambulatory Visit: Payer: Self-pay | Admitting: Family Medicine

## 2013-09-23 NOTE — Telephone Encounter (Signed)
Call in #60 with 5 rf 

## 2014-03-28 ENCOUNTER — Other Ambulatory Visit: Payer: Self-pay | Admitting: Family Medicine

## 2014-03-29 ENCOUNTER — Other Ambulatory Visit: Payer: Self-pay | Admitting: Orthopedic Surgery

## 2014-03-29 DIAGNOSIS — M25512 Pain in left shoulder: Secondary | ICD-10-CM

## 2014-03-30 ENCOUNTER — Telehealth: Payer: Self-pay | Admitting: Family Medicine

## 2014-03-30 NOTE — Telephone Encounter (Signed)
vm not set up. Pt needs med follow up

## 2014-03-30 NOTE — Telephone Encounter (Signed)
Called to the pharmacy and left on voicemail.  Tried to reach the pt on cell.  No voicemail box available.  Will send a message to scheduling to help him get an upcoming appt.

## 2014-03-30 NOTE — Telephone Encounter (Signed)
Pt has been sch

## 2014-03-30 NOTE — Telephone Encounter (Signed)
callin #[phone] with no rf. He needs an OV after that

## 2014-03-30 NOTE — Telephone Encounter (Signed)
Per Dr. Sarajane Jews, pt needs to be seen in the next 30 days.  Please help the pt to get on the schedule.  I also tried to reach him but no voicemail box.  Thanks!

## 2014-04-08 ENCOUNTER — Ambulatory Visit
Admission: RE | Admit: 2014-04-08 | Discharge: 2014-04-08 | Disposition: A | Discharge status: Home / Self Care | Payer: BC Managed Care – PPO | Source: Ambulatory Visit | Attending: Orthopedic Surgery | Admitting: Orthopedic Surgery

## 2014-04-08 DIAGNOSIS — M25512 Pain in left shoulder: Secondary | ICD-10-CM

## 2014-04-08 MED ORDER — IOHEXOL 180 MG/ML  SOLN
15.0000 mL | Freq: Once | INTRAMUSCULAR | Status: AC | PRN
  Administered 2014-04-08: 15 mL via INTRA_ARTICULAR

## 2014-04-27 ENCOUNTER — Encounter: Payer: Self-pay | Admitting: Family Medicine

## 2014-04-27 ENCOUNTER — Ambulatory Visit (INDEPENDENT_AMBULATORY_CARE_PROVIDER_SITE_OTHER): Payer: BLUE CROSS/BLUE SHIELD | Admitting: Family Medicine

## 2014-04-27 VITALS — BP 155/97 | HR 87 | Temp 98.6°F | Ht 68.0 in | Wt 200.0 lb

## 2014-04-27 DIAGNOSIS — R739 Hyperglycemia, unspecified: Secondary | ICD-10-CM

## 2014-04-27 DIAGNOSIS — F411 Generalized anxiety disorder: Secondary | ICD-10-CM

## 2014-04-27 DIAGNOSIS — R030 Elevated blood-pressure reading, without diagnosis of hypertension: Secondary | ICD-10-CM

## 2014-04-27 DIAGNOSIS — IMO0001 Reserved for inherently not codable concepts without codable children: Secondary | ICD-10-CM

## 2014-04-27 DIAGNOSIS — I10 Essential (primary) hypertension: Secondary | ICD-10-CM | POA: Insufficient documentation

## 2014-04-27 DIAGNOSIS — E785 Hyperlipidemia, unspecified: Secondary | ICD-10-CM

## 2014-04-27 MED ORDER — LORAZEPAM 1 MG PO TABS: 1.0000 mg | ORAL_TABLET | Freq: Four times a day (QID) | ORAL | Status: DC | PRN

## 2014-04-27 NOTE — Progress Notes (Signed)
   Subjective:    Patient ID: Dean Alvarado, male    DOB: 15-Feb-1956, 59 y.o.   MRN: [id number]  HPI Here to follow up. He is past due for a cpx here although he gets some wellness checks at his job. His BP has remained mildly elevated over the past year, averaging in the 140s over 90s. His recent labs at work are about the same as a year ago, with his glucose at 119 and his LDL at 131. He admits to not exercising and eating a poor diet. He feels well except for some left shoulder pain which resulted from a fall. He is seeing orthopedics for this.    Review of Systems  Constitutional: Negative.   Respiratory: Negative.   Cardiovascular: Negative.        Objective:   Physical Exam  Constitutional: He appears well-developed and well-nourished.  Cardiovascular: Normal rate, regular rhythm, normal heart sounds and intact distal pulses.   Pulmonary/Chest: Effort normal and breath sounds normal.          Assessment & Plan:  We agreed that he needs to start on an aggressive diet and exercise plan so he can lose some weight. He will schedule a cpx with [place] in 3 months, and we will re-evaluate these issues then.

## 2014-04-27 NOTE — Progress Notes (Signed)
Pre visit review using our clinic review tool, if applicable. No additional management support is needed unless otherwise documented below in the visit note. 

## 2014-04-28 ENCOUNTER — Telehealth: Payer: Self-pay | Admitting: Family Medicine

## 2014-04-28 NOTE — Telephone Encounter (Signed)
emmi mailed  °

## 2014-05-05 ENCOUNTER — Encounter: Payer: Self-pay | Admitting: Family Medicine

## 2014-05-09 ENCOUNTER — Telehealth: Payer: Self-pay | Admitting: Family Medicine

## 2014-05-09 NOTE — Telephone Encounter (Signed)
Pt came in to say that his bp was running high and asked if someone would be able to rx him some medicine. I did let him know that Dr Sarajane Jews was full this afternoon but he could see the triage or someone else. Pt asked if they could rx him something at that time. I advise pt that triage would not be able to but could consult with Dr Sarajane Jews. He said if they could not rx him something then he would go to Urgent Care and he left.

## 2014-05-09 NOTE — Telephone Encounter (Signed)
Call in Lisinopril 20 mg daily, #30 with 5 rf. See me for an OV in 3 weeks

## 2014-05-09 NOTE — Telephone Encounter (Signed)
I spoke with pt and he went to urgent care and started on medication, he said that he will follow up with Dr. Sarajane Jews to discuss new medications.

## 2014-05-09 NOTE — Telephone Encounter (Signed)
[facility] - Client [facility] Patient Name: Dean Alvarado DOB: 07/21/1955 Initial Comment Caller states blood pressure is 177/116, no other symptoms stated, patient declined triage, stating he is coming into the office. Nurse Assessment Nurse: Ronnald Ramp, RN, Miranda Date/Time (Eastern Time): 05/09/2014 3:32:59 PM Confirm and document reason for call. If symptomatic, describe symptoms. ---Caller states he went to the office and they would not do anything and he was taking care of this another way. Has the patient traveled out of the country within the last 30 days? ---Not Applicable Does the patient require triage? ---Declined Triage Guidelines Guideline Title Affirmed Question Affirmed Notes Final Disposition User Call Cancelled By Request Nyoka Cowden, Amy

## 2014-05-09 NOTE — Telephone Encounter (Signed)
Pls advise.  

## 2014-10-22 ENCOUNTER — Other Ambulatory Visit: Payer: Self-pay | Admitting: Family Medicine

## 2014-10-25 NOTE — Telephone Encounter (Signed)
Call in #60 with 5 rf 

## 2015-04-04 ENCOUNTER — Encounter: Payer: Self-pay | Admitting: Family Medicine

## 2015-04-04 ENCOUNTER — Ambulatory Visit (INDEPENDENT_AMBULATORY_CARE_PROVIDER_SITE_OTHER): Payer: BLUE CROSS/BLUE SHIELD | Admitting: Family Medicine

## 2015-04-04 VITALS — BP 122/86 | HR 84 | Temp 98.2°F | Ht 68.0 in | Wt 200.0 lb

## 2015-04-04 DIAGNOSIS — Z0000 Encounter for general adult medical examination without abnormal findings: Secondary | ICD-10-CM | POA: Diagnosis not present

## 2015-04-04 DIAGNOSIS — R739 Hyperglycemia, unspecified: Secondary | ICD-10-CM | POA: Diagnosis not present

## 2015-04-04 DIAGNOSIS — Z23 Encounter for immunization: Secondary | ICD-10-CM | POA: Diagnosis not present

## 2015-04-04 MED ORDER — LISINOPRIL-HYDROCHLOROTHIAZIDE 10-12.5 MG PO TABS: 1.0000 | ORAL_TABLET | Freq: Every day | ORAL | Status: DC

## 2015-04-04 MED ORDER — LORAZEPAM 1 MG PO TABS: 1.0000 mg | ORAL_TABLET | Freq: Four times a day (QID) | ORAL | Status: DC | PRN

## 2015-04-04 MED ORDER — ATORVASTATIN CALCIUM 20 MG PO TABS: 20.0000 mg | ORAL_TABLET | Freq: Every day | ORAL | Status: DC

## 2015-04-04 NOTE — Progress Notes (Signed)
   Subjective:    Patient ID: Dean Alvarado, male    DOB: [birth year], 59 y.o.   MRN: [id number]  HPI 59 yr old male for a cpx. He feels well. His BP as been stable. He tries to watch his diet but gets little exercise.    Review of Systems  Constitutional: Negative.   HENT: Negative.   Eyes: Negative.   Respiratory: Negative.   Cardiovascular: Negative.   Gastrointestinal: Negative.   Genitourinary: Negative.   Musculoskeletal: Negative.   Skin: Negative.   Neurological: Negative.   Psychiatric/Behavioral: Negative.        Objective:   Physical Exam  Constitutional: He is oriented to person, place, and time. He appears well-developed and well-nourished. No distress.  HENT:  Head: Normocephalic and atraumatic.  Right Ear: External ear normal.  Left Ear: External ear normal.  Nose: Nose normal.  Mouth/Throat: Oropharynx is clear and moist. No oropharyngeal exudate.  Eyes: Conjunctivae and EOM are normal. Pupils are equal, round, and reactive to light. Right eye exhibits no discharge. Left eye exhibits no discharge. No scleral icterus.  Neck: Neck supple. No JVD present. No tracheal deviation present. No thyromegaly present.  Cardiovascular: Normal rate, regular rhythm, normal heart sounds and intact distal pulses.  Exam reveals no gallop and no friction rub.   No murmur heard. EKG normal   Pulmonary/Chest: Effort normal and breath sounds normal. No respiratory distress. He has no wheezes. He has no rales. He exhibits no tenderness.  Abdominal: Soft. Bowel sounds are normal. He exhibits no distension and no mass. There is no tenderness. There is no rebound and no guarding.  Genitourinary: Rectum normal, prostate normal and penis normal. Guaiac negative stool. No penile tenderness.  Musculoskeletal: Normal range of motion. He exhibits no edema or tenderness.  Lymphadenopathy:    He has no cervical adenopathy.  Neurological: He is alert and oriented to person, place, and time.  He has normal reflexes. No cranial nerve deficit. He exhibits normal muscle tone. Coordination normal.  Skin: Skin is warm and dry. No rash noted. He is not diaphoretic. No erythema. No pallor.  Psychiatric: He has a normal mood and affect. His behavior is normal. Judgment and thought content normal.          Assessment & Plan:  Well exam. We discussed diet and exercise. Start on Lipitor for the high cholesterol. Recheck in 90 days. Draw an A1c and PSA soon.

## 2015-04-04 NOTE — Progress Notes (Signed)
Pre visit review using our clinic review tool, if applicable. No additional management support is needed unless otherwise documented below in the visit note. 

## 2015-04-04 NOTE — Addendum Note (Signed)
Addended by: Aggie Hacker A on: 04/04/2015 12:12 PM   Modules accepted: Orders

## 2015-04-18 ENCOUNTER — Other Ambulatory Visit (INDEPENDENT_AMBULATORY_CARE_PROVIDER_SITE_OTHER): Payer: BLUE CROSS/BLUE SHIELD

## 2015-04-18 DIAGNOSIS — R739 Hyperglycemia, unspecified: Secondary | ICD-10-CM

## 2015-04-18 DIAGNOSIS — Z0000 Encounter for general adult medical examination without abnormal findings: Secondary | ICD-10-CM

## 2015-04-18 LAB — HEMOGLOBIN A1C: Hgb A1c MFr Bld: 5.9 % (ref 4.6–6.5)

## 2015-04-18 LAB — PSA: PSA: 1.05 ng/mL (ref 0.10–4.00)

## 2015-07-31 ENCOUNTER — Telehealth: Payer: Self-pay | Admitting: Family Medicine

## 2015-07-31 NOTE — Telephone Encounter (Signed)
Pt was calling in to make appt. for labs state that Dr. Sarajane Jews wanted him to have labs done after being on Lipitor for 95mos.

## 2015-08-01 NOTE — Telephone Encounter (Signed)
Does pt need any other labs other than the cholesterol level?

## 2015-08-06 NOTE — Telephone Encounter (Signed)
He will need lipids and a hepatic panel

## 2015-08-07 ENCOUNTER — Other Ambulatory Visit: Payer: Self-pay | Admitting: Family Medicine

## 2015-08-07 DIAGNOSIS — E785 Hyperlipidemia, unspecified: Secondary | ICD-10-CM

## 2015-08-07 NOTE — Telephone Encounter (Signed)
I put future lab order in computer, can you call pt to schedule lab appointment?

## 2015-08-08 NOTE — Telephone Encounter (Signed)
Appt scheduled

## 2015-08-11 ENCOUNTER — Other Ambulatory Visit (INDEPENDENT_AMBULATORY_CARE_PROVIDER_SITE_OTHER): Payer: BLUE CROSS/BLUE SHIELD

## 2015-08-11 DIAGNOSIS — R319 Hematuria, unspecified: Secondary | ICD-10-CM

## 2015-08-11 DIAGNOSIS — E785 Hyperlipidemia, unspecified: Secondary | ICD-10-CM | POA: Diagnosis not present

## 2015-08-11 LAB — POC URINALSYSI DIPSTICK (AUTOMATED)
Bilirubin, UA: NEGATIVE
Glucose, UA: NEGATIVE
Ketones, UA: NEGATIVE
Leukocytes, UA: NEGATIVE
Nitrite, UA: NEGATIVE
Protein, UA: NEGATIVE
Spec Grav, UA: 1.02
Urobilinogen, UA: 0.2
pH, UA: 5.5

## 2015-08-11 LAB — HEPATIC FUNCTION PANEL
ALT: 23 U/L (ref 0–53)
AST: 14 U/L (ref 0–37)
Albumin: 4.1 g/dL (ref 3.5–5.2)
Alkaline Phosphatase: 87 U/L (ref 39–117)
Bilirubin, Direct: 0.1 mg/dL (ref 0.0–0.3)
Total Bilirubin: 0.6 mg/dL (ref 0.2–1.2)
Total Protein: 7.1 g/dL (ref 6.0–8.3)

## 2015-08-11 LAB — LIPID PANEL
Cholesterol: 184 mg/dL (ref 0–200)
HDL: 35.1 mg/dL — ABNORMAL LOW (ref >39.00)
LDL Cholesterol: 127 mg/dL — ABNORMAL HIGH (ref 0–99)
NonHDL: 148.76
Total CHOL/HDL Ratio: 5
Triglycerides: 110 mg/dL (ref 0.0–149.0)
VLDL: 22 mg/dL (ref 0.0–40.0)

## 2015-08-13 LAB — URINE CULTURE
Colony Count: NO GROWTH
Organism ID, Bacteria: NO GROWTH

## 2015-09-28 ENCOUNTER — Other Ambulatory Visit: Payer: Self-pay | Admitting: Family Medicine

## 2015-09-29 NOTE — Telephone Encounter (Signed)
Call in #60 with 5 rf 

## 2015-12-13 ENCOUNTER — Other Ambulatory Visit: Payer: Self-pay | Admitting: Family Medicine

## 2015-12-18 NOTE — Telephone Encounter (Signed)
Rx called in 

## 2015-12-18 NOTE — Telephone Encounter (Signed)
Call in #60 with 5 rf 

## 2016-03-21 ENCOUNTER — Other Ambulatory Visit: Payer: Self-pay | Admitting: Family Medicine

## 2016-06-19 ENCOUNTER — Other Ambulatory Visit: Payer: Self-pay | Admitting: Family Medicine

## 2016-06-24 ENCOUNTER — Ambulatory Visit (INDEPENDENT_AMBULATORY_CARE_PROVIDER_SITE_OTHER): Payer: BLUE CROSS/BLUE SHIELD | Admitting: Family Medicine

## 2016-06-24 ENCOUNTER — Encounter: Payer: Self-pay | Admitting: Family Medicine

## 2016-06-24 VITALS — BP 144/99 | HR 79 | Temp 98.6°F | Ht 68.0 in | Wt 205.0 lb

## 2016-06-24 DIAGNOSIS — R739 Hyperglycemia, unspecified: Secondary | ICD-10-CM

## 2016-06-24 DIAGNOSIS — Z0000 Encounter for general adult medical examination without abnormal findings: Secondary | ICD-10-CM | POA: Diagnosis not present

## 2016-06-24 MED ORDER — LORAZEPAM 1 MG PO TABS: 1.0000 mg | ORAL_TABLET | Freq: Four times a day (QID) | ORAL | 5 refills | Status: DC | PRN

## 2016-06-24 NOTE — Progress Notes (Signed)
   Subjective:    Patient ID: Dean Alvarado, male    DOB: 07/09/1955, 61 y.o.   MRN: [id number]  HPI 61 yr old male for a well exam. He feels fine. He brings fasting labs with him from work and these were remarkable for a glucose of 142.    Review of Systems  Constitutional: Negative.   HENT: Negative.   Eyes: Negative.   Respiratory: Negative.   Cardiovascular: Negative.   Gastrointestinal: Negative.   Genitourinary: Negative.   Musculoskeletal: Negative.   Skin: Negative.   Neurological: Negative.   Psychiatric/Behavioral: Negative.        Objective:   Physical Exam  Constitutional: He is oriented to person, place, and time. He appears well-developed and well-nourished. No distress.  HENT:  Head: Normocephalic and atraumatic.  Right Ear: External ear normal.  Left Ear: External ear normal.  Nose: Nose normal.  Mouth/Throat: Oropharynx is clear and moist. No oropharyngeal exudate.  Eyes: Conjunctivae and EOM are normal. Pupils are equal, round, and reactive to light. Right eye exhibits no discharge. Left eye exhibits no discharge. No scleral icterus.  Neck: Neck supple. No JVD present. No tracheal deviation present. No thyromegaly present.  Cardiovascular: Normal rate, regular rhythm, normal heart sounds and intact distal pulses.  Exam reveals no gallop and no friction rub.   No murmur heard. Pulmonary/Chest: Effort normal and breath sounds normal. No respiratory distress. He has no wheezes. He has no rales. He exhibits no tenderness.  Abdominal: Soft. Bowel sounds are normal. He exhibits no distension and no mass. There is no tenderness. There is no rebound and no guarding.  Genitourinary: Rectum normal, prostate normal and penis normal. Rectal exam shows guaiac negative stool. No penile tenderness.  Musculoskeletal: Normal range of motion. He exhibits no edema or tenderness.  Lymphadenopathy:    He has no cervical adenopathy.  Neurological: He is alert and oriented to  person, place, and time. He has normal reflexes. No cranial nerve deficit. He exhibits normal muscle tone. Coordination normal.  Skin: Skin is warm and dry. No rash noted. He is not diaphoretic. No erythema. No pallor.  Psychiatric: He has a normal mood and affect. His behavior is normal. Judgment and thought content normal.          Assessment & Plan:  Well exam . We discussed diet and exercise. Get an A1c and a PSA today.  Alysia Penna, MD

## 2016-06-24 NOTE — Progress Notes (Signed)
Pre visit review using our clinic review tool, if applicable. No additional management support is needed unless otherwise documented below in the visit note. 

## 2016-06-24 NOTE — Patient Instructions (Signed)
WE NOW OFFER   [facility] FAST TRACK!!!  SAME DAY Appointments for ACUTE CARE  Such as: Sprains, Injuries, cuts, abrasions, rashes, muscle pain, joint pain, back pain Colds, flu, sore throats, headache, allergies, cough, fever  Ear pain, sinus and eye infections Abdominal pain, nausea, vomiting, diarrhea, upset stomach Animal/insect bites  3 Easy Ways to Schedule: Walk-In Scheduling Call in scheduling Mychart Sign-up: [email or web address]         

## 2016-06-25 LAB — PSA: PSA: 1.68 ng/mL (ref 0.10–4.00)

## 2016-06-25 LAB — HEMOGLOBIN A1C: Hgb A1c MFr Bld: 6.3 % (ref 4.6–6.5)

## 2016-09-16 ENCOUNTER — Other Ambulatory Visit: Payer: Self-pay | Admitting: Family Medicine

## 2016-12-16 ENCOUNTER — Other Ambulatory Visit: Payer: Self-pay | Admitting: Family Medicine

## 2016-12-16 NOTE — Telephone Encounter (Signed)
Call in #60 with 5 rf 

## 2017-04-07 ENCOUNTER — Ambulatory Visit: Payer: Self-pay

## 2017-04-07 ENCOUNTER — Encounter (HOSPITAL_COMMUNITY): Payer: Self-pay | Admitting: Neurology

## 2017-04-07 ENCOUNTER — Other Ambulatory Visit: Payer: Self-pay

## 2017-04-07 ENCOUNTER — Emergency Department (HOSPITAL_COMMUNITY): Payer: BLUE CROSS/BLUE SHIELD

## 2017-04-07 ENCOUNTER — Inpatient Hospital Stay (HOSPITAL_COMMUNITY)
Admission: EM | Admit: 2017-04-07 | Discharge: 2017-04-08 | DRG: 068 | Disposition: A | Discharge status: Home / Self Care | Payer: BLUE CROSS/BLUE SHIELD | Attending: Internal Medicine | Admitting: Internal Medicine

## 2017-04-07 DIAGNOSIS — F17210 Nicotine dependence, cigarettes, uncomplicated: Secondary | ICD-10-CM | POA: Diagnosis present

## 2017-04-07 DIAGNOSIS — H53122 Transient visual loss, left eye: Secondary | ICD-10-CM | POA: Diagnosis not present

## 2017-04-07 DIAGNOSIS — I10 Essential (primary) hypertension: Secondary | ICD-10-CM | POA: Diagnosis not present

## 2017-04-07 DIAGNOSIS — R7302 Impaired glucose tolerance (oral): Secondary | ICD-10-CM | POA: Diagnosis present

## 2017-04-07 DIAGNOSIS — Z6831 Body mass index (BMI) 31.0-31.9, adult: Secondary | ICD-10-CM

## 2017-04-07 DIAGNOSIS — E785 Hyperlipidemia, unspecified: Secondary | ICD-10-CM | POA: Diagnosis present

## 2017-04-07 DIAGNOSIS — F411 Generalized anxiety disorder: Secondary | ICD-10-CM | POA: Diagnosis not present

## 2017-04-07 DIAGNOSIS — G459 Transient cerebral ischemic attack, unspecified: Secondary | ICD-10-CM | POA: Diagnosis not present

## 2017-04-07 DIAGNOSIS — E669 Obesity, unspecified: Secondary | ICD-10-CM | POA: Diagnosis not present

## 2017-04-07 DIAGNOSIS — H547 Unspecified visual loss: Secondary | ICD-10-CM | POA: Diagnosis not present

## 2017-04-07 DIAGNOSIS — Z791 Long term (current) use of non-steroidal anti-inflammatories (NSAID): Secondary | ICD-10-CM | POA: Diagnosis not present

## 2017-04-07 DIAGNOSIS — Z136 Encounter for screening for cardiovascular disorders: Secondary | ICD-10-CM | POA: Diagnosis not present

## 2017-04-07 DIAGNOSIS — I5030 Unspecified diastolic (congestive) heart failure: Secondary | ICD-10-CM | POA: Diagnosis not present

## 2017-04-07 DIAGNOSIS — G453 Amaurosis fugax: Secondary | ICD-10-CM

## 2017-04-07 DIAGNOSIS — I6522 Occlusion and stenosis of left carotid artery: Secondary | ICD-10-CM | POA: Diagnosis not present

## 2017-04-07 DIAGNOSIS — E7800 Pure hypercholesterolemia, unspecified: Secondary | ICD-10-CM | POA: Diagnosis present

## 2017-04-07 LAB — COMPREHENSIVE METABOLIC PANEL
ALT: 39 U/L (ref 17–63)
AST: 27 U/L (ref 15–41)
Albumin: 4.1 g/dL (ref 3.5–5.0)
Alkaline Phosphatase: 110 U/L (ref 38–126)
Anion gap: 9 (ref 5–15)
BUN: 11 mg/dL (ref 6–20)
CO2: 24 mmol/L (ref 22–32)
Calcium: 9.2 mg/dL (ref 8.9–10.3)
Chloride: 104 mmol/L (ref 101–111)
Creatinine, Ser: 0.78 mg/dL (ref 0.61–1.24)
GFR calc Af Amer: >60 mL/min (ref >60)
GFR calc non Af Amer: >60 mL/min (ref >60)
Glucose, Bld: 116 mg/dL — ABNORMAL HIGH (ref 65–99)
Potassium: 3.7 mmol/L (ref 3.5–5.1)
Sodium: 137 mmol/L (ref 135–145)
Total Bilirubin: 0.7 mg/dL (ref 0.3–1.2)
Total Protein: 7.5 g/dL (ref 6.5–8.1)

## 2017-04-07 LAB — I-STAT TROPONIN, ED: Troponin i, poc: 0 ng/mL (ref 0.00–0.08)

## 2017-04-07 LAB — PROTIME-INR
INR: 0.96
Prothrombin Time: 12.7 seconds (ref 11.4–15.2)

## 2017-04-07 LAB — DIFFERENTIAL
Basophils Absolute: 0 10*3/uL (ref 0.0–0.1)
Basophils Relative: 0 %
Eosinophils Absolute: 0.1 10*3/uL (ref 0.0–0.7)
Eosinophils Relative: 1 %
Lymphocytes Relative: 20 %
Lymphs Abs: 3 10*3/uL (ref 0.7–4.0)
Monocytes Absolute: 1.3 10*3/uL — ABNORMAL HIGH (ref 0.1–1.0)
Monocytes Relative: 9 %
Neutro Abs: 10.4 10*3/uL — ABNORMAL HIGH (ref 1.7–7.7)
Neutrophils Relative %: 70 %

## 2017-04-07 LAB — I-STAT CHEM 8, ED
BUN: 12 mg/dL (ref 6–20)
Calcium, Ion: 1.13 mmol/L — ABNORMAL LOW (ref 1.15–1.40)
Chloride: 102 mmol/L (ref 101–111)
Creatinine, Ser: 0.8 mg/dL (ref 0.61–1.24)
Glucose, Bld: 115 mg/dL — ABNORMAL HIGH (ref 65–99)
HCT: 49 % (ref 39.0–52.0)
Hemoglobin: 16.7 g/dL (ref 13.0–17.0)
Potassium: 3.7 mmol/L (ref 3.5–5.1)
Sodium: 138 mmol/L (ref 135–145)
TCO2: 24 mmol/L (ref 22–32)

## 2017-04-07 LAB — CBC
HCT: 44.5 % (ref 39.0–52.0)
Hemoglobin: 16.2 g/dL (ref 13.0–17.0)
MCH: 32.7 pg (ref 26.0–34.0)
MCHC: 36.4 g/dL — ABNORMAL HIGH (ref 30.0–36.0)
MCV: 89.9 fL (ref 78.0–100.0)
Platelets: 299 10*3/uL (ref 150–400)
RBC: 4.95 MIL/uL (ref 4.22–5.81)
RDW: 12.9 % (ref 11.5–15.5)
WBC: 14.8 10*3/uL — ABNORMAL HIGH (ref 4.0–10.5)

## 2017-04-07 LAB — CBG MONITORING, ED: Glucose-Capillary: 114 mg/dL — ABNORMAL HIGH (ref 65–99)

## 2017-04-07 LAB — APTT: aPTT: 30 seconds (ref 24–36)

## 2017-04-07 MED ORDER — IOPAMIDOL (ISOVUE-370) INJECTION 76%
INTRAVENOUS | Status: AC
  Administered 2017-04-07: 50 mL via INTRAVENOUS
  Filled 2017-04-07: qty 50

## 2017-04-07 MED ORDER — ASPIRIN 81 MG PO CHEW
324.0000 mg | CHEWABLE_TABLET | Freq: Once | ORAL | Status: AC
  Administered 2017-04-07: 324 mg via ORAL
  Filled 2017-04-07: qty 4

## 2017-04-07 NOTE — ED Provider Notes (Signed)
[facility] Provider Note   CSN: [id number] Arrival date & time: 04/07/17  1503     History   Chief Complaint No chief complaint on file.   HPI Dean Al[facility]ado is a 61 y.o. male.  61 yo M with a chief complaint of left visual field deficit.  The patient felt that he suddenly could not see anything out of his left eye.  He saw some light to the top aspect of it.  This lasted for about 4 minutes and then resolved.  He denied any other neurologic deficit.  Denied headache or head injury.  Denied unilateral numbness or weakness.  He denies difficulty with speech or swallowing.  No history of stroke in the past.  History of hypertension and hyperlipidemia.  The patient was unsure if this was monocular or binocular.   The history is provided by the patient.  Neurologic Problem  This is a new problem. The current episode started 1 to 2 hours ago. The problem occurs rarely. The problem has been resolved. Pertinent negatives include no chest pain, no abdominal pain, no headaches and no shortness of breath. Nothing aggravates the symptoms. Nothing relieves the symptoms. He has tried nothing for the symptoms. The treatment provided no relief.    Past Medical History:  Diagnosis Date  . Allergy   . Hyperlipidemia   . Hypertension     Patient Active Problem List   Diagnosis Date Noted  . Hyperlipidemia 04/27/2014  . HTN (hypertension) 04/27/2014  . Anxiety state 04/27/2014  . Hyperglycemia 03/23/2013  . Cerumen impaction 11/18/2011  . ALLERGIC RHINITIS 11/18/2006    Past Surgical History:  Procedure Laterality Date  . COLONOSCOPY  09-13-09   per Dr. Sharlett Iles, diverticulosis only, repeat in 10 yrs   . plastic surgery left eye         Home Medications    Prior to Admission medications   Medication Sig Start Date End Date Taking? Authorizing Provider  atorvastatin (LIPITOR) 20 MG tablet TAKE 1 TABLET BY MOUTH EVERY DAY Patient taking  differently: TAKE 1 TABLET (20mg )BY MOUTH EVERY DAY 09/17/16  Yes Laurey Morale, MD  ibuprofen (ADVIL,MOTRIN) 200 MG tablet Take 200 mg by mouth daily.   Yes [provider]  lisinopril-hydrochlorothiazide (PRINZIDE,ZESTORETIC) 10-12.5 MG tablet TAKE 1 TABLET BY MOUTH EVERY DAY 09/17/16  Yes Laurey Morale, MD  LORazepam (ATIVAN) 1 MG tablet TAKE 1 TABLET EVERY 6 HOURS AS NEEDED Patient taking differently: TAKE 1 TABLET EVERY 6 HOURS AS NEEDED ANXIETY 12/17/16  Yes Laurey Morale, MD    Family History Family History  Problem Relation Age of Onset  . Diabetes Unknown   . Hypertension Unknown     Social History Social History   Tobacco Use  . Smoking status: Current Every Day Smoker  . Smokeless tobacco: Never Used  . Tobacco comment: 1/2 pack per day for the last year  Substance Use Topics  . Alcohol use: Yes    Alcohol/week: 0.0 oz    Comment: occ  . Drug use: No     Allergies   Patient has no known allergies.   Review of Systems Review of Systems  Constitutional: Negative for chills and fever.  HENT: Negative for congestion and facial swelling.   Eyes: Positive for visual disturbance. Negative for discharge.  Respiratory: Negative for shortness of breath.   Cardiovascular: Negative for chest pain and palpitations.  Gastrointestinal: Negative for abdominal pain, diarrhea and vomiting.  Musculoskeletal: Negative  for arthralgias and myalgias.  Skin: Negative for color change and rash.  Neurological: Negative for tremors, syncope and headaches.  Psychiatric/Behavioral: Negative for confusion and dysphoric mood.     Physical Exam Updated Vital Signs BP (!) 119/94   Pulse 81   Temp 99 F (37.2 C) (Oral)   Resp (!) 21   Ht 5\' 8"  (1.727 m)   Wt 93 kg (205 lb)   SpO2 95%   BMI 31.17 kg/m   Physical Exam  Constitutional: He is oriented to person, place, and time. He appears well-developed and well-nourished.  HENT:  Head: Normocephalic and atraumatic.    Eyes: EOM are normal. Pupils are equal, round, and reactive to light.  Neck: Normal range of motion. Neck supple. No JVD present.  Cardiovascular: Normal rate and regular rhythm. Exam reveals no gallop and no friction rub.  No murmur heard. Pulmonary/Chest: No respiratory distress. He has no wheezes.  Abdominal: He exhibits no distension. There is no rebound and no guarding.  Musculoskeletal: Normal range of motion.  Neurological: He is alert and oriented to person, place, and time. He has normal strength. No cranial nerve deficit or sensory deficit. Coordination normal. GCS eye subscore is 4. GCS verbal subscore is 5. GCS motor subscore is 6.  Skin: No rash noted. No pallor.  Psychiatric: He has a normal mood and affect. His behavior is normal.  Nursing note and vitals reviewed.    ED Treatments / Results  Labs (all labs ordered are listed, but only abnormal results are displayed) Labs Reviewed  CBC - Abnormal; Notable for the following components:      Result Value   WBC 14.8 (*)    MCHC 36.4 (*)    All other components within normal limits  DIFFERENTIAL - Abnormal; Notable for the following components:   Neutro Abs 10.4 (*)    Monocytes Absolute 1.3 (*)    All other components within normal limits  COMPREHENSIVE METABOLIC PANEL - Abnormal; Notable for the following components:   Glucose, Bld 116 (*)    All other components within normal limits  CBG MONITORING, ED - Abnormal; Notable for the following components:   Glucose-Capillary 114 (*)    All other components within normal limits  I-STAT CHEM 8, ED - Abnormal; Notable for the following components:   Glucose, Bld 115 (*)    Calcium, Ion 1.13 (*)    All other components within normal limits  PROTIME-INR  APTT  I-STAT TROPONIN, ED    EKG  EKG Interpretation  Date/Time:  Monday April 07 2017 16:10:27 EST Ventricular Rate:  90 PR Interval:  182 QRS Duration: 68 QT Interval:  352 QTC Calculation: 430 R  Axis:   8 Text Interpretation:  Normal sinus rhythm Cannot rule out Anterior infarct , age undetermined Abnormal ECG No old tracing to compare Confirmed by Deno Etienne [phone]) on 04/07/2017 11:02:53 PM       Radiology Ct Angio Head W Or Wo Contrast  Result Date: 04/07/2017 CLINICAL DATA:  Initial evaluation for transient left sided visual loss. Now resolved. EXAM: CT ANGIOGRAPHY HEAD AND NECK TECHNIQUE: Multidetector CT imaging of the head and neck was performed using the standard protocol during bolus administration of intravenous contrast. Multiplanar CT image reconstructions and MIPs were obtained to evaluate the vascular anatomy. Carotid stenosis measurements (when applicable) are obtained utilizing NASCET criteria, using the distal internal carotid diameter as the denominator. CONTRAST:  35mL ISOVUE-370 IOPAMIDOL (ISOVUE-370) INJECTION 76% COMPARISON:  Prior brain MRI from  earlier same day. FINDINGS: CT HEAD FINDINGS Brain: Cerebral volume within normal limits for patient age. No evidence for acute intracranial hemorrhage. No findings to suggest acute large vessel territory infarct. No mass lesion, midline shift, or mass effect. Ventricles are normal in size without evidence for hydrocephalus. No extra-axial fluid collection identified. Vascular: No hyperdense vessel identified. Skull: Scalp soft tissues demonstrate no acute abnormality.Calvarium intact. Subcentimeter calcified nodule at the right frontal scalp noted, of doubtful significance. Sinuses/Orbits: Globes and orbital soft tissues are within normal limits. Visualized paranasal sinuses are clear. No mastoid effusion. CTA NECK FINDINGS Aortic arch: Aortic arch of normal caliber with normal branch pattern. No flow-limiting stenosis about the origin of the great vessels. Minimal plaque at the origin left subclavian artery noted. Visualized subclavian artery is widely patent. Right carotid system: Right common and internal carotid arteries widely  patent without stenosis, dissection, or occlusion. Mild atheromatous plaque about the right bifurcation without stenosis. Left carotid system: Left common carotid artery widely patent from its origin to the bifurcation. There is a short-segment severe near occlusive stenosis involving the proximal left ICA (series 13, image 213). Stenosis measures approximately 4 mm in length, and is positioned approximately 13 mm distal to the bifurcation. Left ICA patent distally to the skullbase without additional stenosis, dissection, or occlusion. Vertebral arteries: Both of the vertebral arteries arise from the subclavian arteries. Vertebral arteries widely patent within the neck without stenosis, dissection, or occlusion. Skeleton: No acute osseus abnormality. No worrisome lytic or blastic osseous lesions. Mild degenerate spondylolysis noted at C3-4 through C5-6. Other neck: No acute soft tissue abnormality within the neck. No adenopathy. Salivary glands normal. Thyroid normal. Upper chest: Visualized upper chest within normal limits. Visualized lungs are grossly clear. Review of the MIP images confirms the above findings CTA HEAD FINDINGS Anterior circulation: Internal carotid artery is widely patent to the termini without flow-limiting stenosis. Minimal focal plaque noted within the cavernous right ICA without stenosis. Origin of the ophthalmic artery is not well seen. A1 segments patent. Left A1 hypoplastic. Normal anterior communicating artery. Anterior cerebral artery is well opacified to their distal aspects without flow-limiting stenosis. Patent M1 segments without stenosis. Normal MCA bifurcations. Distal MCA branches well perfused and symmetric. Posterior circulation: Vertebral arteries widely patent to the vertebrobasilar junction. Patent left PICA. Dominant right AICA. Basilar widely patent to its distal aspect. Superior cerebral arteries patent bilaterally. Both of the posterior cerebral artery supplied via the  basilar and are well perfused to their distal aspects without stenosis. Venous sinuses: Patent. Anatomic variants: None significant. No aneurysm or vascular malformation. Delayed phase: No abnormal enhancement. Review of the MIP images confirms the above findings IMPRESSION: 1. Short-segment severe near occlusive proximal left ICA stenosis as above. 2. Otherwise normal CTA of the head and neck. No large vessel occlusion. No other high-grade or correctable stenosis. Electronically Signed   By: Jeannine Boga M.D.   On: 04/07/2017 23:02   Ct Angio Neck W And/or Wo Contrast  Result Date: 04/07/2017 CLINICAL DATA:  Initial evaluation for transient left sided visual loss. Now resolved. EXAM: CT ANGIOGRAPHY HEAD AND NECK TECHNIQUE: Multidetector CT imaging of the head and neck was performed using the standard protocol during bolus administration of intravenous contrast. Multiplanar CT image reconstructions and MIPs were obtained to evaluate the vascular anatomy. Carotid stenosis measurements (when applicable) are obtained utilizing NASCET criteria, using the distal internal carotid diameter as the denominator. CONTRAST:  54mL ISOVUE-370 IOPAMIDOL (ISOVUE-370) INJECTION 76% COMPARISON:  Prior brain MRI  from earlier same day. FINDINGS: CT HEAD FINDINGS Brain: Cerebral volume within normal limits for patient age. No evidence for acute intracranial hemorrhage. No findings to suggest acute large vessel territory infarct. No mass lesion, midline shift, or mass effect. Ventricles are normal in size without evidence for hydrocephalus. No extra-axial fluid collection identified. Vascular: No hyperdense vessel identified. Skull: Scalp soft tissues demonstrate no acute abnormality.Calvarium intact. Subcentimeter calcified nodule at the right frontal scalp noted, of doubtful significance. Sinuses/Orbits: Globes and orbital soft tissues are within normal limits. Visualized paranasal sinuses are clear. No mastoid effusion.  CTA NECK FINDINGS Aortic arch: Aortic arch of normal caliber with normal branch pattern. No flow-limiting stenosis about the origin of the great vessels. Minimal plaque at the origin left subclavian artery noted. Visualized subclavian artery is widely patent. Right carotid system: Right common and internal carotid arteries widely patent without stenosis, dissection, or occlusion. Mild atheromatous plaque about the right bifurcation without stenosis. Left carotid system: Left common carotid artery widely patent from its origin to the bifurcation. There is a short-segment severe near occlusive stenosis involving the proximal left ICA (series 13, image 213). Stenosis measures approximately 4 mm in length, and is positioned approximately 13 mm distal to the bifurcation. Left ICA patent distally to the skullbase without additional stenosis, dissection, or occlusion. Vertebral arteries: Both of the vertebral arteries arise from the subclavian arteries. Vertebral arteries widely patent within the neck without stenosis, dissection, or occlusion. Skeleton: No acute osseus abnormality. No worrisome lytic or blastic osseous lesions. Mild degenerate spondylolysis noted at C3-4 through C5-6. Other neck: No acute soft tissue abnormality within the neck. No adenopathy. Salivary glands normal. Thyroid normal. Upper chest: Visualized upper chest within normal limits. Visualized lungs are grossly clear. Review of the MIP images confirms the above findings CTA HEAD FINDINGS Anterior circulation: Internal carotid artery is widely patent to the termini without flow-limiting stenosis. Minimal focal plaque noted within the cavernous right ICA without stenosis. Origin of the ophthalmic artery is not well seen. A1 segments patent. Left A1 hypoplastic. Normal anterior communicating artery. Anterior cerebral artery is well opacified to their distal aspects without flow-limiting stenosis. Patent M1 segments without stenosis. Normal MCA  bifurcations. Distal MCA branches well perfused and symmetric. Posterior circulation: Vertebral arteries widely patent to the vertebrobasilar junction. Patent left PICA. Dominant right AICA. Basilar widely patent to its distal aspect. Superior cerebral arteries patent bilaterally. Both of the posterior cerebral artery supplied via the basilar and are well perfused to their distal aspects without stenosis. Venous sinuses: Patent. Anatomic variants: None significant. No aneurysm or vascular malformation. Delayed phase: No abnormal enhancement. Review of the MIP images confirms the above findings IMPRESSION: 1. Short-segment severe near occlusive proximal left ICA stenosis as above. 2. Otherwise normal CTA of the head and neck. No large vessel occlusion. No other high-grade or correctable stenosis. Electronically Signed   By: Jeannine Boga M.D.   On: 04/07/2017 23:02   Mr Brain Wo Contrast  Result Date: 04/07/2017 CLINICAL DATA:  5 minutes episode of LEFT vision loss last night. History of hypertension and hyperlipidemia. EXAM: MRI HEAD WITHOUT CONTRAST TECHNIQUE: Multiplanar, multiecho pulse sequences of the brain and surrounding structures were obtained without intravenous contrast. COMPARISON:  None. FINDINGS: BRAIN: No reduced diffusion to suggest acute ischemia or hyperacute demyelination. No susceptibility artifact to suggest hemorrhage. The ventricles and sulci are normal for patient's age. No suspicious parenchymal signal, mass or mass effect. No abnormal extra-axial fluid collections. VASCULAR: Normal major intracranial vascular flow  voids present at skull base. SKULL AND UPPER CERVICAL SPINE: No abnormal sellar expansion. No suspicious calvarial bone marrow signal. Craniocervical junction maintained. SINUSES/ORBITS: The mastoid air-cells and included paranasal sinuses are well-aerated. The included ocular globes and orbital contents are non-suspicious. OTHER: None. IMPRESSION: Normal noncontrast  MRI head. Electronically Signed   By: Elon Alas M.D.   On: 04/07/2017 19:53    Procedures Procedures (including critical care time)  Medications Ordered in ED Medications  aspirin chewable tablet 324 mg (not administered)  iopamidol (ISOVUE-370) 76 % injection (50 mLs Intravenous Contrast Given 04/07/17 2202)     Initial Impression / Assessment and Plan / ED Course  I have reviewed the triage vital signs and the nursing notes.  Pertinent labs & imaging results that were available during my care of the patient were reviewed by me and considered in my medical decision making (see chart for details).     61 yO M with a chief complaint of left visual field deficit.  This spontaneously resolved.  I discussed case with Dr. Lorraine Lax, neurology he recommended a visualization of the arterial supply to the brain.    CT angiogram is concerning for a stenosis of the left ICA.  Discussed this finding with the neurologist, Dr. Lorraine Lax, recommends hospitalist admission.  Will discuss with IR for likely procedure in the morning.  The patients results and plan were reviewed and discussed.   Any x-rays performed were independently reviewed by myself.   Differential diagnosis were considered with the presenting HPI.  Medications  aspirin chewable tablet 324 mg (not administered)  iopamidol (ISOVUE-370) 76 % injection (50 mLs Intravenous Contrast Given 04/07/17 2202)    Vitals:   04/07/17 2100 04/07/17 2230 04/07/17 2245 04/07/17 2300  BP: (!) 119/94     Pulse: 80 84 78 81  Resp: 17 16 18  (!) 21  Temp:      TempSrc:      SpO2: 96% 97% 96% 95%  Weight:      Height:        Final diagnoses:  Transient visual loss of left eye  Stenosis of left internal carotid artery    Admission/ observation were discussed with the admitting physician, patient and/or family and they are comfortable with the plan.    Final Clinical Impressions(s) / ED Diagnoses   Final diagnoses:  Transient visual  loss of left eye  Stenosis of left internal carotid artery    ED Discharge Orders    None       Deno Etienne, DO 04/07/17 2319

## 2017-04-07 NOTE — ED Triage Notes (Addendum)
Pt reports today at 8 pm last night was sitting watching TV, sudden loss of vision in his left eye. Could see some upper vision, but everything else was black. He rubbed his eye, stood up, went to the bathroom washed his face. Came back on its own, was gone for about 4 minutes. Called his doctor and told to come here. Denies any sx now, vision is baseline now. Is a x 4. Ambulatory.

## 2017-04-07 NOTE — ED Notes (Signed)
Neurology provider at bedside.

## 2017-04-07 NOTE — ED Notes (Signed)
Talked with Dr. Tamera Punt, will order MRI vs CT. Has no sx as of current.

## 2017-04-07 NOTE — ED Notes (Signed)
Patient transported to MRI 

## 2017-04-07 NOTE — Telephone Encounter (Signed)
I confirmed pt is in the ER now.

## 2017-04-07 NOTE — Telephone Encounter (Signed)
Pt called with complaint of near complete vision loss to the left eye that lasted 3-4 minutes. States that it occurred last night.  Stated lost his vision to the left eye,  he could see a "sliver at the top" that he could see. States that is BP has been high and that "maybe the blood is not going to where it needs to go."  Advised pt to have someone drive him to the ER (going to [facility]) asap.  Reason for Disposition . Complete loss of vision in 1 or both eyes  Answer Assessment - Initial Assessment Questions 1. DESCRIPTION: "What is the vision loss like? Describe it for me." (e.g., complete vision loss, blurred vision, double vision, floaters, etc.)     Left eye only for 3-4 minutes complete loss except for a sliver that pt could see through 2. LOCATION: "One or both eyes?" If one, ask: "Which eye?"     Left eye only 3. SEVERITY: "Can you see anything?" If so, ask: "What can you see?" (e.g., fine print)     Can see fine and large print 4. ONSET: "When did this begin?" "Did it start suddenly or has this been gradual?"     Last night around 7 pm  5. PATTERN: "Does this come and go, or has it been constant since it started?"     That was the only time had vision loss Pt does have floaters both eyes 6. PAIN: "Is there any pain in your eye(s)?"  (Scale 1-10; or mild, moderate, severe)     No pain 7. CONTACTS-GLASSES: "Do you wear contacts or glasses?"     glasses 8. CAUSE: "What do you think is causing this visual problem?"     "blood had a hard time getting to where it needed to go" 9. OTHER SYMPTOMS: "Do you have any other symptoms?" (e.g., confusion, headache, arm or leg weakness, speech problems)     No other sx 10. PREGNANCY: "Is there any chance you are pregnant?" "When was your last menstrual period?"       n/a  Protocols used: [place]

## 2017-04-07 NOTE — ED Notes (Signed)
Patient transported to CT 

## 2017-04-08 ENCOUNTER — Inpatient Hospital Stay (HOSPITAL_COMMUNITY): Payer: BLUE CROSS/BLUE SHIELD

## 2017-04-08 ENCOUNTER — Encounter (HOSPITAL_COMMUNITY): Payer: Self-pay

## 2017-04-08 ENCOUNTER — Other Ambulatory Visit: Payer: Self-pay

## 2017-04-08 DIAGNOSIS — I5030 Unspecified diastolic (congestive) heart failure: Secondary | ICD-10-CM

## 2017-04-08 DIAGNOSIS — G459 Transient cerebral ischemic attack, unspecified: Secondary | ICD-10-CM

## 2017-04-08 DIAGNOSIS — I6522 Occlusion and stenosis of left carotid artery: Secondary | ICD-10-CM | POA: Insufficient documentation

## 2017-04-08 DIAGNOSIS — E785 Hyperlipidemia, unspecified: Secondary | ICD-10-CM

## 2017-04-08 DIAGNOSIS — F411 Generalized anxiety disorder: Secondary | ICD-10-CM

## 2017-04-08 DIAGNOSIS — G453 Amaurosis fugax: Secondary | ICD-10-CM

## 2017-04-08 DIAGNOSIS — I10 Essential (primary) hypertension: Secondary | ICD-10-CM

## 2017-04-08 DIAGNOSIS — H53122 Transient visual loss, left eye: Secondary | ICD-10-CM | POA: Insufficient documentation

## 2017-04-08 HISTORY — DX: Transient cerebral ischemic attack, unspecified: G45.9

## 2017-04-08 HISTORY — DX: Occlusion and stenosis of left carotid artery: I65.22

## 2017-04-08 LAB — LIPID PANEL
Cholesterol: 113 mg/dL (ref 0–200)
HDL: 41 mg/dL (ref >40)
LDL Cholesterol: 52 mg/dL (ref 0–99)
Total CHOL/HDL Ratio: 2.8 RATIO
Triglycerides: 101 mg/dL (ref <150)
VLDL: 20 mg/dL (ref 0–40)

## 2017-04-08 LAB — ECHOCARDIOGRAM COMPLETE
Ao-asc: 34 cm
Area-P 1/2: 3.73 cm2
E decel time: 201 msec
E/e' ratio: 11.82
FS: 26 % — AB (ref 28–44)
Height: 68 in
IVS/LV PW RATIO, ED: 0.75
LA ID, A-P, ES: 39 mm
LA diam end sys: 39 mm
LA diam index: 1.82 cm/m2
LA vol A4C: 55.1 ml
LA vol index: 30.1 mL/m2
LA vol: 64.4 mL
LV E/e' medial: 11.82
LV E/e'average: 11.82
LV PW d: 12 mm — AB (ref 0.6–1.1)
LV e' LATERAL: 5.87 cm/s
LVOT area: 3.46 cm2
LVOT diameter: 21 mm
Lateral S' vel: 18.4 cm/s
MV Dec: 201
MV pk A vel: 93.6 m/s
MV pk E vel: 69.4 m/s
P 1/2 time: 59 ms
TAPSE: 24.3 mm
TDI e' lateral: 5.87
TDI e' medial: 8.49
Weight: 3280 oz

## 2017-04-08 LAB — HIV ANTIBODY (ROUTINE TESTING W REFLEX): HIV Screen 4th Generation wRfx: NONREACTIVE

## 2017-04-08 MED ORDER — STROKE: EARLY STAGES OF RECOVERY BOOK
Freq: Once | Status: DC
  Filled 2017-04-08: qty 1

## 2017-04-08 MED ORDER — SODIUM CHLORIDE 0.9 % IV SOLN
INTRAVENOUS | Status: DC
  Administered 2017-04-08: 08:00:00 via INTRAVENOUS

## 2017-04-08 MED ORDER — ACETAMINOPHEN 650 MG RE SUPP: 650.0000 mg | RECTAL | Status: DC | PRN

## 2017-04-08 MED ORDER — ATORVASTATIN CALCIUM 40 MG PO TABS
40.0000 mg | ORAL_TABLET | Freq: Every day | ORAL | Status: DC
  Administered 2017-04-08: 40 mg via ORAL
  Filled 2017-04-08 (×2): qty 1

## 2017-04-08 MED ORDER — LISINOPRIL 10 MG PO TABS: 10.0000 mg | ORAL_TABLET | Freq: Every day | ORAL | Status: DC

## 2017-04-08 MED ORDER — ACETAMINOPHEN 160 MG/5ML PO SOLN: 650.0000 mg | ORAL | Status: DC | PRN

## 2017-04-08 MED ORDER — LORAZEPAM 1 MG PO TABS
1.0000 mg | ORAL_TABLET | Freq: Four times a day (QID) | ORAL | Status: DC | PRN
Start: 2017-04-08 — End: 2017-04-08
  Administered 2017-04-08: 1 mg via ORAL
  Filled 2017-04-08: qty 1

## 2017-04-08 MED ORDER — LISINOPRIL-HYDROCHLOROTHIAZIDE 10-12.5 MG PO TABS: 1.0000 | ORAL_TABLET | Freq: Every day | ORAL | Status: DC

## 2017-04-08 MED ORDER — ASPIRIN 325 MG PO TABS
325.0000 mg | ORAL_TABLET | Freq: Every day | ORAL | Status: DC
  Administered 2017-04-08: 325 mg via ORAL
  Filled 2017-04-08: qty 1

## 2017-04-08 MED ORDER — HYDROCHLOROTHIAZIDE 12.5 MG PO CAPS: 12.5000 mg | ORAL_CAPSULE | Freq: Every day | ORAL | Status: DC

## 2017-04-08 MED ORDER — ATORVASTATIN CALCIUM 10 MG PO TABS: 20.0000 mg | ORAL_TABLET | Freq: Every day | ORAL | Status: DC

## 2017-04-08 MED ORDER — ASPIRIN 325 MG PO TABS: 325.0000 mg | ORAL_TABLET | Freq: Every day | ORAL | 0 refills | Status: DC

## 2017-04-08 MED ORDER — ASPIRIN 300 MG RE SUPP: 300.0000 mg | Freq: Every day | RECTAL | Status: DC

## 2017-04-08 MED ORDER — ACETAMINOPHEN 325 MG PO TABS: 650.0000 mg | ORAL_TABLET | ORAL | Status: DC | PRN

## 2017-04-08 NOTE — ED Notes (Signed)
Pt given sandwich bag and water. OK per neurology.

## 2017-04-08 NOTE — Progress Notes (Signed)
NEUROHOSPITALISTS STROKE TEAM - DAILY PROGRESS NOTE   ADMISSION HISTORY: Dean Alvarado is an 61 y.o. male past medical history for hypertension, hyperlipidemia and current tobacco use presents to the emergency room after having a transient episode of loss of vision in the left eye lasting for about 4 minutes on Sunday around 8 PM. He states he was watching TV when he suddenly noticed his left eye went completely black except for small portion at the top of his visual field. He started rubbing his eyes, washed his face,  closed his left eye to see that his right eye was affected. States that by the time he figured what was going on his vision returned. He decided to come to the emergency room today to have this evaluated. MRI was performed which showed no stroke. Neurology was consulted who recommended getting a CT angiogram which showed critical stenosis of the left proximal ICA  SUBJECTIVE (INTERVAL HISTORY) No family is at the bedside. Patient is found laying in bed in NAD. Overall he feels his condition is completely resolved. Voices no new complaints. No new events reported overnight.  OBJECTIVE Lab Results: CBC:  Recent Labs  Lab 04/07/17 1617 04/07/17 1654  WBC 14.8*  --   HGB 16.2 16.7  HCT 44.5 49.0  MCV 89.9  --   PLT 299  --    BMP: Recent Labs  Lab 04/07/17 1617 04/07/17 1654  NA 137 138  K 3.7 3.7  CL 104 102  CO2 24  --   GLUCOSE 116* 115*  BUN 11 12  CREATININE 0.78 0.80  CALCIUM 9.2  --    Liver Function Tests:  Recent Labs  Lab 04/07/17 1617  AST 27  ALT 39  ALKPHOS 110  BILITOT 0.7  PROT 7.5  ALBUMIN 4.1   Coagulation Studies:  Recent Labs    04/07/17 1617  APTT 30  INR 0.96   PHYSICAL EXAM Temp:  [98.2 F (36.8 C)-99 F (37.2 C)] 98.2 F (36.8 C) (12/18 0310) Pulse Rate:  [69-97] 70 (12/18 1045) Resp:  [14-26] 14 (12/18 1045) BP: (105-158)/(76-102) 139/88 (12/18 0800) SpO2:  [91  %-97 %] 93 % (12/18 1045) FiO2 (%):  [0 %] 0 % (12/18 0150) Weight:  [93 kg (205 lb)] 93 kg (205 lb) (12/17 1601) General - Well nourished, well developed, in no apparent distress Respiratory - Lungs clear bilaterally. No wheezing. Cardiovascular - Regular rate and rhythm  Neurological Examination Mental Status: Alert, oriented, thought content appropriate.  Speech fluent without evidence of aphasia. Able to follow 3 step commands without difficulty. Cranial Nerves: II: Visual fields grossly normal,  III,IV, VI: ptosis not present, extra-ocular motions intact bilaterally, pupils equal, round, reactive to light and accommodation V,VII: smile symmetric, facial light touch sensation normal bilaterally VIII: hearing normal bilaterally IX,X: uvula rises symmetrically XI: bilateral shoulder shrug XII: midline tongue extension Motor: Right :  Upper extremity   5/5                                      Left:     Upper extremity   5/5             Lower extremity   5/5  Lower extremity   5/5 Tone and bulk:normal tone throughout; no atrophy noted Sensory: Pinprick and light touch intact throughout, bilaterally Deep Tendon Reflexes: 2+ and symmetric throughout Plantars: Right: downgoing                                Left: downgoing Cerebellar: normal finger-to-nose, normal rapid alternating movements and normal heel-to-shin test Gait: normal gait and station  IMAGING: I have personally reviewed the radiological images below and agree with the radiology interpretations. Ct Angio Head and neck W Or Wo Contrast Result Date: 04/07/2017 IMPRESSION: 1. Short-segment severe near occlusive proximal left ICA stenosis as above. 2. Otherwise normal CTA of the head and neck. No large vessel occlusion. No other high-grade or correctable stenosis. Electronically Signed   By: Jeannine Boga M.D.   On: 04/07/2017 23:02   Mr Brain Wo Contrast Result Date:  04/07/2017 IMPRESSION: Normal noncontrast MRI head. Electronically Signed   By: Elon Alas M.D.   On: 04/07/2017 19:53   Echocardiogram:                                                  PENDING  B/L Carotid U/S:     1-39% right internal carotid artery stenosis,  80-99% left internal carotid artery stenosis.  The left ICA stenosis is not focal, and measures 1.75cm in length. The beginning of the stenosis is 1.3cm from the bifurcation, the end of the stenosis is 1cm from the mandible. The bifurcation is located at the hyoid. Bilateral vertebral arteries are patent with antegrade flow                                                  ASSESSMENT: [name] is a 61 y.o. male with PMH of hypertension and hyperlipidemia admitted to the hospital for reports of vision loss in his left eye.  MRI reveals:  Severe left carotid stenosis  R/O STROKE:  Suspected Etiology: Severe left carotid stenosis Resultant Symptoms: Visual loss left eye -resolved Stroke Risk Factors: hyperlipidemia, hypertension and smoking Other Stroke Risk Factors: Advanced age, Cigarette smoker, Obesity, Body mass index is 31.17 kg/m.   Outstanding Stroke Work-up Studies:     Echocardiogram: not done               PENDING  04/08/2017: Neuro exam stable.  All symptoms have resolved.  Echocardiogram pending.  Carotid endarterectomy is scheduled for Thursday.  Patient likely to be discharged home and be readmitted Thursday morning.  Patient instructed to take blood pressure at home if greater than 277 systolic he is to hold his blood pressure medications.  Patient has been encouraged to limit his activity until his surgery and to drink plenty of fluids.  PLAN  04/08/2017: Continue Aspirin/ Statin Frequent neuro checks Telemetry monitoring PT/OT/SLP Ongoing aggressive stroke risk factor management Patient counseled to be compliant with her/his antithrombotic medications Patient counseled on Lifestyle  modifications including, Diet, Exercise, and Stress  Symptomatic Left carotid stenosis - greater than 80%  Carotid endarterectomy scheduled for Thursday morning  HYPERTENSION: Stable Permissive hypertension (OK if <220/120) for 24-48 hours post stroke and then gradually  normalized within 5-7 days. Long term BP goal normotensive. May slowly restart home B/P medications after 48 hours Home Meds: Lisinopril HCTZ  HYPERLIPIDEMIA:    Component Value Date/Time   CHOL 113 04/08/2017 0219   TRIG 101 04/08/2017 0219   HDL 41 04/08/2017 0219   CHOLHDL 2.8 04/08/2017 0219   VLDL 20 04/08/2017 0219   LDLCALC 52 04/08/2017 0219  Home Meds: Lipitor 20 LDL  goal < 70 Continued on Lipitor to 20 mg daily Continue statin at discharge  DIABETES: Hemoglobin A1c PENDING Lab Results  Component Value Date   HGBA1C 6.3 06/24/2016  HgbA1c goal < 7.0 Currently on: None Continue CBG monitoring and SSI to maintain glucose 140-180 mg/dl DM education   TOBACCO ABUSE  Current smoker Smoking cessation counseling provided Nicotine patch provided  OBESITY Obesity, Body mass index is 31.17 kg/m. Greater than/equal to 30  Other Active Problems: Principal Problem:   TIA (transient ischemic attack) Active Problems:   Hyperlipidemia   HTN (hypertension)   Anxiety state  Hospital day # 1 VTE prophylaxis: SCD's  Diet : Diet NPO time specified Except for: Sips with Meds   FAMILY UPDATES: No family at bedside  TEAM UPDATES: Modena Jansky, MD     Prior Home Stroke Medications:  No antithrombotic  Discharge Stroke Meds:  Please discharge patient on aspirin 325 mg daily   Disposition: HOME Follow Up:  Laurey Morale, MD -PCP Follow up in 1-2 weeks     Renie Ora Stroke Neurology Team 04/08/2017 12:55 PM  ATTENDING NOTE: I reviewed above note and agree with the assessment and plan. I have made any additions or clarifications directly to the above note. Pt was seen and  examined.   61 year old male with history of hypertension, hyperlipidemia, smoker admitted for transient left eye vision loss, lasted for minutes, now resolved.  MRI negative.  CTA head neck showed left ICA high-grade stenosis.  Carotid Doppler showed left ICA 80-98% stenosis.  LDL 52 and A1c pending.  Patient symptoms typical for amaurosis fugax.  Vascular surgery consulted and plan for left CEA in 2 days.  Patient BP on the low end, put on IV fluid at 100 cc/h.  Put patient on aspirin 325 and continue Lipitor for stroke prevention.  Encourage patient at home adequate hydration, hold all blood pressure medication if SBP less than 160.  Recommend mostly bedrest for the next 2 days before surgery.  After procedure, BP goal normotensive.  Neurology will sign off. Please call with questions. Pt will follow up with Cecille Rubin, NP, at [facility] in about 6 weeks. Thanks for the consult.   Rosalin Hawking, MD PhD Stroke Neurology 04/08/2017 10:48 PM    To contact Stroke Continuity provider, please refer to [email or web address] After hours, contact General Neurology

## 2017-04-08 NOTE — H&P (Signed)
History and Physical    Dean Alvarado:[id number] DOB: 13-Aug-1955 DOA: 04/07/2017  PCP: Laurey Morale, MD  Patient coming from:  home  Chief Complaint:  Vision changes  HPI: Dean Alvarado is a 61 y.o. male with medical history significant of HTN, HLD comes in with an episode of left vision loss last night at 8pm (over 24 hours ago) that lasted about 4 minutes then resolved.  Pt reports there was sudden veil that went down his left eye of darkness.  There was no facial numbness, tingling, problems swallowing or smiling  No weakness, numbness or tingling in any extremities.  No symptoms since this happened last night.  No fevers.  Takes no blood thinners, aspirin products chronically.  No h/o stroke.  Pt on work up in ED today found to have stenosis of left ICA and referred for admission for possible stent in am.  Review of Systems: As per HPI otherwise 10 point review of systems negative.   Past Medical History:  Diagnosis Date  . Allergy   . Hyperlipidemia   . Hypertension     Past Surgical History:  Procedure Laterality Date  . COLONOSCOPY  09-13-09   per Dr. Sharlett Iles, diverticulosis only, repeat in 10 yrs   . plastic surgery left eye       reports that he has been smoking.  he has never used smokeless tobacco. He reports that he drinks alcohol. He reports that he does not use drugs.  No Known Allergies  Family History  Problem Relation Age of Onset  . Diabetes Unknown   . Hypertension Unknown     Prior to Admission medications   Medication Sig Start Date End Date Taking? Authorizing Provider  atorvastatin (LIPITOR) 20 MG tablet TAKE 1 TABLET BY MOUTH EVERY DAY Patient taking differently: TAKE 1 TABLET (20mg )BY MOUTH EVERY DAY 09/17/16  Yes Laurey Morale, MD  ibuprofen (ADVIL,MOTRIN) 200 MG tablet Take 200 mg by mouth daily.   Yes [provider]  lisinopril-hydrochlorothiazide (PRINZIDE,ZESTORETIC) 10-12.5 MG tablet TAKE 1 TABLET BY MOUTH EVERY DAY  09/17/16  Yes Laurey Morale, MD  LORazepam (ATIVAN) 1 MG tablet TAKE 1 TABLET EVERY 6 HOURS AS NEEDED Patient taking differently: TAKE 1 TABLET EVERY 6 HOURS AS NEEDED ANXIETY 12/17/16  Yes Laurey Morale, MD    Physical Exam: Vitals:   04/07/17 2230 04/07/17 2245 04/07/17 2300 04/08/17 0001  BP:    (!) 151/94  Pulse: 84 78 81 76  Resp: 16 18 (!) 21 16  Temp:      TempSrc:      SpO2: 97% 96% 95% 96%  Weight:      Height:          Constitutional: NAD, calm, comfortable Vitals:   04/07/17 2230 04/07/17 2245 04/07/17 2300 04/08/17 0001  BP:    (!) 151/94  Pulse: 84 78 81 76  Resp: 16 18 (!) 21 16  Temp:      TempSrc:      SpO2: 97% 96% 95% 96%  Weight:      Height:       Eyes: PERRL, lids and conjunctivae normal ENMT: Mucous membranes are moist. Posterior pharynx clear of any exudate or lesions.Normal dentition.  Neck: normal, supple, no masses, no thyromegaly Respiratory: clear to auscultation bilaterally, no wheezing, no crackles. Normal respiratory effort. No accessory muscle use.  Cardiovascular: Regular rate and rhythm, no murmurs / rubs / gallops. No extremity edema. 2+ pedal pulses. No carotid  bruits.  Abdomen: no tenderness, no masses palpated. No hepatosplenomegaly. Bowel sounds positive.  Musculoskeletal: no clubbing / cyanosis. No joint deformity upper and lower extremities. Good ROM, no contractures. Normal muscle tone.  Skin: no rashes, lesions, ulcers. No induration Neurologic: CN 2-12 grossly intact. Sensation intact, DTR normal. Strength 5/5 in all 4.  Psychiatric: Normal judgment and insight. Alert and oriented x 3. Normal mood.    Labs on Admission: I have personally reviewed following labs and imaging studies  CBC: Recent Labs  Lab 04/07/17 1617 04/07/17 1654  WBC 14.8*  --   NEUTROABS 10.4*  --   HGB 16.2 16.7  HCT 44.5 49.0  MCV 89.9  --   PLT 299  --    Basic Metabolic Panel: Recent Labs  Lab 04/07/17 1617 04/07/17 1654  NA 137 138  K  3.7 3.7  CL 104 102  CO2 24  --   GLUCOSE 116* 115*  BUN 11 12  CREATININE 0.78 0.80  CALCIUM 9.2  --    GFR: Estimated Creatinine Clearance: 105.9 mL/min (by C-G formula based on SCr of 0.8 mg/dL). Liver Function Tests: Recent Labs  Lab 04/07/17 1617  AST 27  ALT 39  ALKPHOS 110  BILITOT 0.7  PROT 7.5  ALBUMIN 4.1   No results for input(s): LIPASE, AMYLASE in the last 168 hours. No results for input(s): AMMONIA in the last 168 hours. Coagulation Profile: Recent Labs  Lab 04/07/17 1617  INR 0.96   Cardiac Enzymes: No results for input(s): CKTOTAL, CKMB, CKMBINDEX, TROPONINI in the last 168 hours. BNP (last 3 results) No results for input(s): PROBNP in the last 8760 hours. HbA1C: No results for input(s): HGBA1C in the last 72 hours. CBG: Recent Labs  Lab 04/07/17 2051  GLUCAP 114*   Lipid Profile: No results for input(s): CHOL, HDL, LDLCALC, TRIG, CHOLHDL, LDLDIRECT in the last 72 hours. Thyroid Function Tests: No results for input(s): TSH, T4TOTAL, FREET4, T3FREE, THYROIDAB in the last 72 hours. Anemia Panel: No results for input(s): VITAMINB12, FOLATE, FERRITIN, TIBC, IRON, RETICCTPCT in the last 72 hours. Urine analysis:    Component Value Date/Time   COLORURINE yellow 02/23/2009 0845   APPEARANCEUR Clear 02/23/2009 0845   LABSPEC 1.015 02/23/2009 0845   PHURINE 6.5 02/23/2009 0845   HGBUR trace-intact 02/23/2009 0845   BILIRUBINUR n 08/11/2015 0936   PROTEINUR n 08/11/2015 0936   UROBILINOGEN 0.2 08/11/2015 0936   UROBILINOGEN 0.2 02/23/2009 0845   NITRITE n 08/11/2015 0936   NITRITE negative 02/23/2009 0845   LEUKOCYTESUR Negative 08/11/2015 0936   Sepsis Labs: !!!!!!!!!!!!!!!!!!!!!!!!!!!!!!!!!!!!!!!!!!!! @LABRCNTIP (procalcitonin:4,lacticidven:4) )No results found for this or any previous visit (from the past 240 hour(s)).   Radiological Exams on Admission: Ct Angio Head W Or Wo Contrast  Result Date: 04/07/2017 CLINICAL DATA:  Initial  evaluation for transient left sided visual loss. Now resolved. EXAM: CT ANGIOGRAPHY HEAD AND NECK TECHNIQUE: Multidetector CT imaging of the head and neck was performed using the standard protocol during bolus administration of intravenous contrast. Multiplanar CT image reconstructions and MIPs were obtained to evaluate the vascular anatomy. Carotid stenosis measurements (when applicable) are obtained utilizing NASCET criteria, using the distal internal carotid diameter as the denominator. CONTRAST:  22mL ISOVUE-370 IOPAMIDOL (ISOVUE-370) INJECTION 76% COMPARISON:  Prior brain MRI from earlier same day. FINDINGS: CT HEAD FINDINGS Brain: Cerebral volume within normal limits for patient age. No evidence for acute intracranial hemorrhage. No findings to suggest acute large vessel territory infarct. No mass lesion, midline shift, or mass  effect. Ventricles are normal in size without evidence for hydrocephalus. No extra-axial fluid collection identified. Vascular: No hyperdense vessel identified. Skull: Scalp soft tissues demonstrate no acute abnormality.Calvarium intact. Subcentimeter calcified nodule at the right frontal scalp noted, of doubtful significance. Sinuses/Orbits: Globes and orbital soft tissues are within normal limits. Visualized paranasal sinuses are clear. No mastoid effusion. CTA NECK FINDINGS Aortic arch: Aortic arch of normal caliber with normal branch pattern. No flow-limiting stenosis about the origin of the great vessels. Minimal plaque at the origin left subclavian artery noted. Visualized subclavian artery is widely patent. Right carotid system: Right common and internal carotid arteries widely patent without stenosis, dissection, or occlusion. Mild atheromatous plaque about the right bifurcation without stenosis. Left carotid system: Left common carotid artery widely patent from its origin to the bifurcation. There is a short-segment severe near occlusive stenosis involving the proximal left ICA  (series 13, image 213). Stenosis measures approximately 4 mm in length, and is positioned approximately 13 mm distal to the bifurcation. Left ICA patent distally to the skullbase without additional stenosis, dissection, or occlusion. Vertebral arteries: Both of the vertebral arteries arise from the subclavian arteries. Vertebral arteries widely patent within the neck without stenosis, dissection, or occlusion. Skeleton: No acute osseus abnormality. No worrisome lytic or blastic osseous lesions. Mild degenerate spondylolysis noted at C3-4 through C5-6. Other neck: No acute soft tissue abnormality within the neck. No adenopathy. Salivary glands normal. Thyroid normal. Upper chest: Visualized upper chest within normal limits. Visualized lungs are grossly clear. Review of the MIP images confirms the above findings CTA HEAD FINDINGS Anterior circulation: Internal carotid artery is widely patent to the termini without flow-limiting stenosis. Minimal focal plaque noted within the cavernous right ICA without stenosis. Origin of the ophthalmic artery is not well seen. A1 segments patent. Left A1 hypoplastic. Normal anterior communicating artery. Anterior cerebral artery is well opacified to their distal aspects without flow-limiting stenosis. Patent M1 segments without stenosis. Normal MCA bifurcations. Distal MCA branches well perfused and symmetric. Posterior circulation: Vertebral arteries widely patent to the vertebrobasilar junction. Patent left PICA. Dominant right AICA. Basilar widely patent to its distal aspect. Superior cerebral arteries patent bilaterally. Both of the posterior cerebral artery supplied via the basilar and are well perfused to their distal aspects without stenosis. Venous sinuses: Patent. Anatomic variants: None significant. No aneurysm or vascular malformation. Delayed phase: No abnormal enhancement. Review of the MIP images confirms the above findings IMPRESSION: 1. Short-segment severe near  occlusive proximal left ICA stenosis as above. 2. Otherwise normal CTA of the head and neck. No large vessel occlusion. No other high-grade or correctable stenosis. Electronically Signed   By: Jeannine Boga M.D.   On: 04/07/2017 23:02   Ct Angio Neck W And/or Wo Contrast  Result Date: 04/07/2017 CLINICAL DATA:  Initial evaluation for transient left sided visual loss. Now resolved. EXAM: CT ANGIOGRAPHY HEAD AND NECK TECHNIQUE: Multidetector CT imaging of the head and neck was performed using the standard protocol during bolus administration of intravenous contrast. Multiplanar CT image reconstructions and MIPs were obtained to evaluate the vascular anatomy. Carotid stenosis measurements (when applicable) are obtained utilizing NASCET criteria, using the distal internal carotid diameter as the denominator. CONTRAST:  57mL ISOVUE-370 IOPAMIDOL (ISOVUE-370) INJECTION 76% COMPARISON:  Prior brain MRI from earlier same day. FINDINGS: CT HEAD FINDINGS Brain: Cerebral volume within normal limits for patient age. No evidence for acute intracranial hemorrhage. No findings to suggest acute large vessel territory infarct. No mass lesion, midline shift, or  mass effect. Ventricles are normal in size without evidence for hydrocephalus. No extra-axial fluid collection identified. Vascular: No hyperdense vessel identified. Skull: Scalp soft tissues demonstrate no acute abnormality.Calvarium intact. Subcentimeter calcified nodule at the right frontal scalp noted, of doubtful significance. Sinuses/Orbits: Globes and orbital soft tissues are within normal limits. Visualized paranasal sinuses are clear. No mastoid effusion. CTA NECK FINDINGS Aortic arch: Aortic arch of normal caliber with normal branch pattern. No flow-limiting stenosis about the origin of the great vessels. Minimal plaque at the origin left subclavian artery noted. Visualized subclavian artery is widely patent. Right carotid system: Right common and  internal carotid arteries widely patent without stenosis, dissection, or occlusion. Mild atheromatous plaque about the right bifurcation without stenosis. Left carotid system: Left common carotid artery widely patent from its origin to the bifurcation. There is a short-segment severe near occlusive stenosis involving the proximal left ICA (series 13, image 213). Stenosis measures approximately 4 mm in length, and is positioned approximately 13 mm distal to the bifurcation. Left ICA patent distally to the skullbase without additional stenosis, dissection, or occlusion. Vertebral arteries: Both of the vertebral arteries arise from the subclavian arteries. Vertebral arteries widely patent within the neck without stenosis, dissection, or occlusion. Skeleton: No acute osseus abnormality. No worrisome lytic or blastic osseous lesions. Mild degenerate spondylolysis noted at C3-4 through C5-6. Other neck: No acute soft tissue abnormality within the neck. No adenopathy. Salivary glands normal. Thyroid normal. Upper chest: Visualized upper chest within normal limits. Visualized lungs are grossly clear. Review of the MIP images confirms the above findings CTA HEAD FINDINGS Anterior circulation: Internal carotid artery is widely patent to the termini without flow-limiting stenosis. Minimal focal plaque noted within the cavernous right ICA without stenosis. Origin of the ophthalmic artery is not well seen. A1 segments patent. Left A1 hypoplastic. Normal anterior communicating artery. Anterior cerebral artery is well opacified to their distal aspects without flow-limiting stenosis. Patent M1 segments without stenosis. Normal MCA bifurcations. Distal MCA branches well perfused and symmetric. Posterior circulation: Vertebral arteries widely patent to the vertebrobasilar junction. Patent left PICA. Dominant right AICA. Basilar widely patent to its distal aspect. Superior cerebral arteries patent bilaterally. Both of the posterior  cerebral artery supplied via the basilar and are well perfused to their distal aspects without stenosis. Venous sinuses: Patent. Anatomic variants: None significant. No aneurysm or vascular malformation. Delayed phase: No abnormal enhancement. Review of the MIP images confirms the above findings IMPRESSION: 1. Short-segment severe near occlusive proximal left ICA stenosis as above. 2. Otherwise normal CTA of the head and neck. No large vessel occlusion. No other high-grade or correctable stenosis. Electronically Signed   By: Jeannine Boga M.D.   On: 04/07/2017 23:02   Mr Brain Wo Contrast  Result Date: 04/07/2017 CLINICAL DATA:  5 minutes episode of LEFT vision loss last night. History of hypertension and hyperlipidemia. EXAM: MRI HEAD WITHOUT CONTRAST TECHNIQUE: Multiplanar, multiecho pulse sequences of the brain and surrounding structures were obtained without intravenous contrast. COMPARISON:  None. FINDINGS: BRAIN: No reduced diffusion to suggest acute ischemia or hyperacute demyelination. No susceptibility artifact to suggest hemorrhage. The ventricles and sulci are normal for patient's age. No suspicious parenchymal signal, mass or mass effect. No abnormal extra-axial fluid collections. VASCULAR: Normal major intracranial vascular flow voids present at skull base. SKULL AND UPPER CERVICAL SPINE: No abnormal sellar expansion. No suspicious calvarial bone marrow signal. Craniocervical junction maintained. SINUSES/ORBITS: The mastoid air-cells and included paranasal sinuses are well-aerated. The included ocular globes and  orbital contents are non-suspicious. OTHER: None. IMPRESSION: Normal noncontrast MRI head. Electronically Signed   By: Elon Alas M.D.   On: 04/07/2017 19:53    EKG: Independently reviewed. nsr Old chart reviewed Case discussed with edp Case discussed with dr Rory Percy with neurology team  Assessment/Plan 61 yo male symptomatic occlussive/stenotic ICA left Principal  Problem:   TIA (transient ischemic attack)/symptomatic CAD- aspirin.  Increase statin.  Echo in am.  Follow up neuro recommendations.  Pt considering stent vs vascular surgery.  His brother is a Publishing rights manager in another state and wants to get his opinion first thing in the am about what route he should take.  Keep npo until decision is made.  Obtain cardiac echo.  Have asked the pt to notify staff immediately if symptoms recur, he is currently symptom free and normal.  Active Problems:   Hyperlipidemia- flp in am, increase statin   HTN (hypertension)- cont home meds   Anxiety state- cont home meds     DVT prophylaxis:  scds Code Status:  full Family Communication:  none Disposition Plan:  Per day team Consults called:  neurology Admission status:  admit   Alletta Mattos A MD [facility]  If 7PM-7AM, please contact night-coverage [email or web address] Password [email or web address]  04/08/2017, 12:41 AM

## 2017-04-08 NOTE — Consult Note (Addendum)
Patient name: Dean Alvarado MRN: [id number] DOB: 18-Jun-1955 Sex: male   REASON FOR CONSULT:    Symptomatic left carotid stenosis.  The consult is requested by Dr. Algis Liming.  HPI:   [name] is a pleasant 61 y.o. male, who developed the sudden onset of loss of vision in the left eye 2 days ago on Sunday.  This was at 8 PM.  His symptoms lasted about 4 minutes.  He came in Monday to the emergency department and he has been down in the emergency room since that time.  He is waiting on a bed up reportedly.  His visual disturbance completely resolved after about 4 minutes.  He had no associated weakness or numbness.  He denies any history of stroke, TIAs, expressive or receptive aphasia.  He was not on aspirin.  He was on a statin  His risk factors for peripheral vascular disease include hypertension, hypercholesterolemia, and a history of tobacco use.  He denies any history of diabetes, or family history of premature cardiovascular disease.  Past Medical History:  Diagnosis Date  . Allergy   . Hyperlipidemia   . Hypertension     Family History  Problem Relation Age of Onset  . Diabetes Unknown   . Hypertension Unknown    He denies any family history of premature cardiovascular disease.  SOCIAL HISTORY: He smokes 1 pack/day of cigarettes and has been smoking for as long as he can remember. Social History   Socioeconomic History  . Marital status: Married    Spouse name: Not on file  . Number of children: Not on file  . Years of education: Not on file  . Highest education level: Not on file  Social Needs  . Financial resource strain: Not on file  . Food insecurity - worry: Not on file  . Food insecurity - inability: Not on file  . Transportation needs - medical: Not on file  . Transportation needs - non-medical: Not on file  Occupational History  . Not on file  Tobacco Use  . Smoking status: Current Every Day Smoker  . Smokeless tobacco: Never Used  . Tobacco  comment: 1/2 pack per day for the last year  Substance and Sexual Activity  . Alcohol use: Yes    Alcohol/week: 0.0 oz    Comment: occ  . Drug use: No  . Sexual activity: Not on file  Other Topics Concern  . Not on file  Social History Narrative  . Not on file    No Known Allergies  Current Facility-Administered Medications  Medication Dose Route Frequency Provider Last Rate Last Dose  .  stroke: mapping our early stages of recovery book   Does not apply Once Derrill Kay A, MD      . 0.9 %  sodium chloride infusion   Intravenous Continuous Rosalin Hawking, MD 100 mL/hr at 04/08/17 0803    . acetaminophen (TYLENOL) tablet 650 mg  650 mg Oral Q4H PRN Phillips Grout, MD       Or  . acetaminophen (TYLENOL) solution 650 mg  650 mg Per Tube Q4H PRN Phillips Grout, MD       Or  . acetaminophen (TYLENOL) suppository 650 mg  650 mg Rectal Q4H PRN Phillips Grout, MD      . aspirin suppository 300 mg  300 mg Rectal Daily Derrill Kay A, MD       Or  . aspirin tablet 325 mg  325 mg Oral Daily  Phillips Grout, MD   325 mg at 04/08/17 0815  . atorvastatin (LIPITOR) tablet 40 mg  40 mg Oral Daily Derrill Kay A, MD   40 mg at 04/08/17 0815  . LORazepam (ATIVAN) tablet 1 mg  1 mg Oral Q6H PRN Phillips Grout, MD       Current Outpatient Medications  Medication Sig Dispense Refill  . atorvastatin (LIPITOR) 20 MG tablet TAKE 1 TABLET BY MOUTH EVERY DAY (Patient taking differently: TAKE 1 TABLET (20mg )BY MOUTH EVERY DAY) 90 tablet 3  . ibuprofen (ADVIL,MOTRIN) 200 MG tablet Take 200 mg by mouth daily.    Marland Kitchen lisinopril-hydrochlorothiazide (PRINZIDE,ZESTORETIC) 10-12.5 MG tablet TAKE 1 TABLET BY MOUTH EVERY DAY 90 tablet 3  . LORazepam (ATIVAN) 1 MG tablet TAKE 1 TABLET EVERY 6 HOURS AS NEEDED (Patient taking differently: TAKE 1 TABLET EVERY 6 HOURS AS NEEDED ANXIETY) 60 tablet 5    REVIEW OF SYSTEMS:  [X]  denotes positive finding, [ ]  denotes negative finding Cardiac  Comments:  Chest pain or  chest pressure:    Shortness of breath upon exertion:    Short of breath when lying flat:    Irregular heart rhythm:        Vascular    Pain in calf, thigh, or hip brought on by ambulation:    Pain in feet at night that wakes you up from your sleep:     Blood clot in your veins:    Leg swelling:         Pulmonary    Oxygen at home:    Productive cough:     Wheezing:         Neurologic    Sudden weakness in arms or legs:     Sudden numbness in arms or legs:     Sudden onset of difficulty speaking or slurred speech:    Temporary loss of vision in one eye:  X   Problems with dizziness:         Gastrointestinal    Blood in stool:     Vomited blood:         Genitourinary    Burning when urinating:     Blood in urine:        Psychiatric    Major depression:         Hematologic    Bleeding problems:    Problems with blood clotting too easily:        Skin    Rashes or ulcers:        Constitutional    Fever or chills:     PHYSICAL EXAM:   Vitals:   04/08/17 0500 04/08/17 0600 04/08/17 0700 04/08/17 0800  BP: 116/77 114/86 115/76 139/88  Pulse: 69 71 70 82  Resp: 18 16 17  (!) 23  Temp:      TempSrc:      SpO2: 95% 93% 94% 95%  Weight:      Height:        GENERAL: The patient is a well-nourished male, in no acute distress. The vital signs are documented above. CARDIAC: There is a regular rate and rhythm.  VASCULAR: He has a left carotid bruit. He has palpable femoral, popliteal, dorsalis pedis, and posterior tibial pulses bilaterally. He has no significant lower extremity swelling. PULMONARY: There is good air exchange bilaterally without wheezing or rales. ABDOMEN: Soft and non-tender with normal pitched bowel sounds.  I do not palpate an abdominal aortic aneurysm. MUSCULOSKELETAL: There are no major deformities  or cyanosis. NEUROLOGIC: No focal weakness or paresthesias are detected. SKIN: There are no ulcers or rashes noted. PSYCHIATRIC: The patient has a  normal affect.  DATA:    CAROTID DUPLEX: I have independently interpreted his carotid duplex scan which shows a greater than 80% left carotid stenosis.  Peak systolic velocity on the left is 507 cm/s.  End-diastolic velocity is 007 cm/s.  The stenosis is in the mid internal carotid artery.  He has no right carotid stenosis.  Both vertebral arteries are patent with antegrade flow.  CT ANGIOGRAM NECK:   This shows a severe short segment stenosis of the proximal left internal carotid artery.  Based on his CT angiogram, the stenosis does appear to be surgically accessible.   ECHO: This has been ordered.   MEDICAL ISSUES:   SYMPTOMATIC LEFT CAROTID STENOSIS: This patient has an asymptomatic greater than 80% left carotid stenosis.  I have recommended left carotid endarterectomy.  I have reviewed the indications for carotid endarterectomy, that is to lower the risk of future stroke. I have also reviewed the potential complications of surgery, including but not limited to: bleeding, stroke (perioperative risk 1-2%), MI, nerve injury of other unpredictable medical problems. All of the patients questions were answered and they are agreeable to proceed with surgery.  I have tentatively scheduled him for surgery on Thursday morning.  I believe that neurology would like to have him admitted and I have paged them to discuss this.  The patient would prefer to go home.  As he was not on aspirin prior to this admission, I do not think this is unreasonable.  We have also discussed the importance of tobacco cessation.     Deitra Mayo [facility] [phone]

## 2017-04-08 NOTE — Evaluation (Signed)
Physical Therapy Evaluation Patient Details Name: Dean Alvarado MRN: [id number] DOB: December 04, 1955 Today's Date: 04/08/2017   History of Present Illness   61 y.o. male past medical history for hypertension, hyperlipidemia and current tobacco use presents to the emergency room after having a transient episode of loss of vision in the left eye lasting for about 4 minutes on Sunday around 8 PM. MRI revealed no acute infarcts. [place] of bilateral carotid revealed 80-99% left internal carotid artery stenosis.  Clinical Impression  Patient evaluated by Physical Therapy with no further acute PT needs identified. All education has been completed and the patient has no further questions. Pt mod I for bed mobility, and independent with transfers and ambulation without assistive device. Pt educated on [facility] stroke symptomology.  PT is signing off. Thank you for this referral.     Follow Up Recommendations No PT follow up    Equipment Recommendations  None recommended by PT    Recommendations for Other Services       Precautions / Restrictions Precautions Precautions: None Restrictions Weight Bearing Restrictions: No      Mobility  Bed Mobility Overal bed mobility: Modified Independent             General bed mobility comments: HoB elevated, use of bedrails  Transfers Overall transfer level: Modified independent Equipment used: None             General transfer comment: strong power up and steadying in standing    Ambulation/Gait Ambulation/Gait assistance: Independent Ambulation Distance (Feet): 30 Feet Assistive device: None Gait Pattern/deviations: WFL(Within Functional Limits) Gait velocity: WFL Gait velocity interpretation: at or above normal speed for age/gender General Gait Details: strong, steady gait, able to navigate around obstacles in L and R periphery   Modified Rankin (Stroke Patients Only) Modified Rankin (Stroke Patients Only) Pre-Morbid Rankin Score: No  symptoms Modified Rankin: No symptoms     Balance Overall balance assessment: Independent                                           Pertinent Vitals/Pain Pain Assessment: No/denies pain    Home Living Family/patient expects to be discharged to:: Private residence Living Arrangements: Spouse/significant other Available Help at Discharge: Family;Available PRN/intermittently Type of Home: House Home Access: Stairs to enter   [employer] of Steps: 4 Home Layout: Two level Home Equipment: None      Prior Function Level of Independence: Independent                  Extremity/Trunk Assessment   Upper Extremity Assessment Upper Extremity Assessment: Overall WFL for tasks assessed    Lower Extremity Assessment Lower Extremity Assessment: Overall WFL for tasks assessed       Communication   Communication: No difficulties  Cognition Arousal/Alertness: Awake/alert Behavior During Therapy: WFL for tasks assessed/performed Overall Cognitive Status: Within Functional Limits for tasks assessed                                        General Comments General comments (skin integrity, edema, etc.): Pt reports that all symptoms have resolved        Assessment/Plan    PT Assessment Patent does not need any further PT services         PT  Goals (Current goals can be found in the Care Plan section)  Acute Rehab PT Goals Patient Stated Goal: go home     AM-PAC PT "6 Clicks" Daily Activity  Outcome Measure Difficulty turning over in bed (including adjusting bedclothes, sheets and blankets)?: None Difficulty moving from lying on back to sitting on the side of the bed? : None Difficulty sitting down on and standing up from a chair with arms (e.g., wheelchair, bedside commode, etc,.)?: None Help needed moving to and from a bed to chair (including a wheelchair)?: None Help needed walking in hospital room?: None Help needed  climbing 3-5 steps with a railing? : None 6 Click Score: 24    End of Session Equipment Utilized During Treatment: Gait belt Activity Tolerance: Patient tolerated treatment well Patient left: in chair;with call bell/phone within reach Nurse Communication: Mobility status PT Visit Diagnosis: Other symptoms and signs involving the nervous system (R29.898)    Time: 1350-1406 PT Time Calculation (min) (ACUTE ONLY): 16 min   Charges:   PT Evaluation $PT Eval Low Complexity: 1 Low     PT G Codes:   PT G-Codes **NOT FOR INPATIENT CLASS** Functional Assessment Tool Used: AM-PAC 6 Clicks Basic Mobility Functional Limitation: Mobility: Walking and moving around Mobility: Walking and Moving Around Current Status (B3419): 0 percent impaired, limited or restricted Mobility: Walking and Moving Around Goal Status (F7902): 0 percent impaired, limited or restricted Mobility: Walking and Moving Around Discharge Status (I0973): 0 percent impaired, limited or restricted    Benjamine Mola B. Migdalia Dk PT, DPT Acute Rehabilitation  [phone] Pager [phone]    Steen 04/08/2017, 2:24 PM

## 2017-04-08 NOTE — Progress Notes (Signed)
Nurse went over discharge with patient.  Pt. Verbalized understanding of discharge. All belongings sent with patient. . Pt. Left ambulatory.

## 2017-04-08 NOTE — Progress Notes (Signed)
  Echocardiogram 2D Echocardiogram has been performed.  Kamry Faraci G TRUE Garciamartinez 04/08/2017, 5:35 PM

## 2017-04-08 NOTE — Progress Notes (Signed)
Pt. Arrived to the unit Alert and Oriented.  Denies pain.  Oriented to equipment in the room.  All questions and concerns addressed. Bed in the lowest positions.

## 2017-04-08 NOTE — Discharge Instructions (Signed)

## 2017-04-08 NOTE — Discharge Summary (Addendum)
Physician Discharge Summary  BENJIMIN HADDEN ZHY:[id number] DOB: 29-Jan-1956  PCP: Laurey Morale, MD  Admit date: 04/07/2017 Discharge date: 04/08/2017  Patient left the hospital [place].  Recommendations for Outpatient Follow-up:  1. Dr. Alysia Penna, PCP in 1 week. 2. Dr. Deitra Mayo, Vascular Surgery on 04/10/17 for left carotid endarterectomy. 3. Dr.Jindong Erlinda Hong, Neurology in 6 weeks. Ambulatory referral sent.   Home Health: None Equipment/Devices: None    Discharge Condition: Improved and stable  CODE STATUS: Full  Diet recommendation: Heart healthy diet.  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 He was watching TV when he suddenly noticed his left eye went completely black except for small portion at the top of his visual field. He rubbed his eyes, washed his face, closed his left eye to see that his right eye was not affected. Vision returned to normal. He presented to the ED for evaluation. Neurology consulted. MRI brain showed no stroke. CTA head and neck showed critical stenosis of left proximal ICA. Stroke team and vascular surgery consulted. Patient is scheduled for left carotid endarterectomy on 04/10/17. Stroke M.D. and vascular surgery cleared patient for discharge home and return as outpatient for surgery.  Assessment and plan:  1. Severe left carotid stenosis: Patient presented with transient blindness of left eye. MRI brain negative for stroke. CTA head and neck showed short segment severe near occlusive proximal left ICA stenosis. Neurology was consulted and discussed options of surgery versus stenting  with patient. Vascular surgery was consulted and have arranged for surgery on 12/20. As d/w Dr. Erlinda Hong, Stroke MD, aspirin 325 MG daily, encouraged to limit his activity until his surgery and to drink plenty of liquids, check blood pressure at home and hold antihypertensives until surgery if SBP less than 160. Continue statins. Smoking cessation counseled. 2. Essential hypertension: As stated above, patient was advised to check his blood pressure at home daily and hold his antihypertensives if SBP less than 160 until his surgery. 3. Hyperlipidemia: LDL 52. Continue atorvastatin. 4. Tobacco abuse: Cessation counseled. Offered nicotine patch which patient declined repeatedly. 5. Glucose intolerance: A1c pending and can be followed as outpatient. A1c was 6.3 in March. 6. Obesity/Body mass index is 31.17 kg/m.  7. Leukocytosis:? Stress response. No clinical suspicion for infection. May follow CBC as outpatient.   Patient was upset earlier this afternoon because the echo was not being done early which was leading to delay in his discharge and he was threatening to leave AMA. RN attempted to expediate the process and was informed by the Echo tech's that patient was on the list but they were busy with multiple patients. Echo was finally done. I was coordinating with Cardiology to have the result read ASAP when patient decided to leave AMA despite his RN explaining to him the risks of leaving AMA without formal discharge paperwork and instructions which could lead to decline in his health. After obtaining echo report, discharge paperwork was completed and RN was advised to call patient to pick up his discharge paperwork and prescriptions.   Consultations:  Neuro hospitalist/Stroke MD  Vascular surgery  Procedures:  2-D echo: Study Conclusions  -  Left ventricle: The cavity size was normal. Wall thickness was   normal. Systolic function was normal. The estimated ejection   fraction was in the range of  60% to 65%. Wall motion was normal;   there were no regional wall motion abnormalities. Doppler   parameters are consistent with abnormal left ventricular   relaxation (grade 1 diastolic dysfunction). The E/e&' ratio is   between 8-15, suggesting indeterminate LV filling pressure. - Mitral valve: Mildly thickened leaflets . There was trivial   regurgitation. - Left atrium: The atrium was normal in size. - Inferior vena cava: The vessel was normal in size. The   respirophasic diameter changes were in the normal range (>= 50%),   consistent with normal central venous pressure.  Impressions:  - LVEF 60-65%, normal wall thickness, normal wall motion, grade 1   DD, indeterminate LV filling pressure, trivial MR, normal LA   size, normal IVC.  Carotid Doppler Vascular Ultrasound Carotid Duplex (Doppler) has been completed.  Findings suggest 1-39% right internal carotid artery stenosis, and 80-99% left internal carotid artery stenosis.  The left ICA stenosis is not focal, and measures 1.75cm in length. The beginning of the stenosis is 1.3cm from the bifurcation, the end of the stenosis is 1cm from the mandible. The bifurcation is located at the hyoid. Bilateral vertebral arteries are patent with antegrade flow.     Discharge Instructions  Discharge Instructions    Ambulatory referral to Neurology   Complete by:  As directed    An appointment is requested in approximately: 6 weeks.   Call MD for:   Complete by:  As directed    Recurrent blindness or strokelike symptoms.   Diet - low sodium heart healthy   Complete by:  As directed    Increase activity slowly   Complete by:  As directed        Medication List    TAKE these medications   aspirin 325 MG tablet Take 1 tablet (325 mg total) by mouth daily. Start taking on:  04/09/2017   atorvastatin 20 MG tablet Commonly known as:  LIPITOR TAKE 1 TABLET BY MOUTH EVERY DAY What changed:    how much to take  how to take  this  when to take this   ibuprofen 200 MG tablet Commonly known as:  ADVIL,MOTRIN Take 200 mg by mouth daily.   lisinopril-hydrochlorothiazide 10-12.5 MG tablet Commonly known as:  PRINZIDE,ZESTORETIC Take 1 tablet by mouth daily. Check blood pressure at home and if systolic (top number less than 160) do not take until after surgery on 04/10/17. What changed:  additional instructions   LORazepam 1 MG tablet Commonly known as:  ATIVAN TAKE 1 TABLET EVERY 6 HOURS AS NEEDED What changed:    how much to take  how to take this  when to take this      Follow-up Information    Angelia Mould, MD Follow up on 04/10/2017.   Specialties:  Vascular Surgery, Cardiology Why:  Follow-up regarding your surgery. Contact information: [place] [address] [phone]        Laurey Morale, MD. Schedule an appointment as soon as possible for a visit in 1 week(s).   Specialty:  Family Medicine Contact information: [place] [address] [phone]          No Known Allergies    Procedures/Studies: Ct Angio Head W Or Wo Contrast  Result Date: 04/07/2017 CLINICAL DATA:  Initial evaluation for transient  left sided visual loss. Now resolved. EXAM: CT ANGIOGRAPHY HEAD AND NECK TECHNIQUE: Multidetector CT imaging of the head and neck was performed using the standard protocol during bolus administration of intravenous contrast. Multiplanar CT image reconstructions and MIPs were obtained to evaluate the vascular anatomy. Carotid stenosis measurements (when applicable) are obtained utilizing NASCET criteria, using the distal internal carotid diameter as the denominator. CONTRAST:  13mL ISOVUE-370 IOPAMIDOL (ISOVUE-370) INJECTION 76% COMPARISON:  Prior brain MRI from earlier same day. FINDINGS: CT HEAD FINDINGS Brain: Cerebral volume within normal limits for patient age. No evidence for acute intracranial hemorrhage. No findings to suggest  acute large vessel territory infarct. No mass lesion, midline shift, or mass effect. Ventricles are normal in size without evidence for hydrocephalus. No extra-axial fluid collection identified. Vascular: No hyperdense vessel identified. Skull: Scalp soft tissues demonstrate no acute abnormality.Calvarium intact. Subcentimeter calcified nodule at the right frontal scalp noted, of doubtful significance. Sinuses/Orbits: Globes and orbital soft tissues are within normal limits. Visualized paranasal sinuses are clear. No mastoid effusion. CTA NECK FINDINGS Aortic arch: Aortic arch of normal caliber with normal branch pattern. No flow-limiting stenosis about the origin of the great vessels. Minimal plaque at the origin left subclavian artery noted. Visualized subclavian artery is widely patent. Right carotid system: Right common and internal carotid arteries widely patent without stenosis, dissection, or occlusion. Mild atheromatous plaque about the right bifurcation without stenosis. Left carotid system: Left common carotid artery widely patent from its origin to the bifurcation. There is a short-segment severe near occlusive stenosis involving the proximal left ICA (series 13, image 213). Stenosis measures approximately 4 mm in length, and is positioned approximately 13 mm distal to the bifurcation. Left ICA patent distally to the skullbase without additional stenosis, dissection, or occlusion. Vertebral arteries: Both of the vertebral arteries arise from the subclavian arteries. Vertebral arteries widely patent within the neck without stenosis, dissection, or occlusion. Skeleton: No acute osseus abnormality. No worrisome lytic or blastic osseous lesions. Mild degenerate spondylolysis noted at C3-4 through C5-6. Other neck: No acute soft tissue abnormality within the neck. No adenopathy. Salivary glands normal. Thyroid normal. Upper chest: Visualized upper chest within normal limits. Visualized lungs are grossly clear.  Review of the MIP images confirms the above findings CTA HEAD FINDINGS Anterior circulation: Internal carotid artery is widely patent to the termini without flow-limiting stenosis. Minimal focal plaque noted within the cavernous right ICA without stenosis. Origin of the ophthalmic artery is not well seen. A1 segments patent. Left A1 hypoplastic. Normal anterior communicating artery. Anterior cerebral artery is well opacified to their distal aspects without flow-limiting stenosis. Patent M1 segments without stenosis. Normal MCA bifurcations. Distal MCA branches well perfused and symmetric. Posterior circulation: Vertebral arteries widely patent to the vertebrobasilar junction. Patent left PICA. Dominant right AICA. Basilar widely patent to its distal aspect. Superior cerebral arteries patent bilaterally. Both of the posterior cerebral artery supplied via the basilar and are well perfused to their distal aspects without stenosis. Venous sinuses: Patent. Anatomic variants: None significant. No aneurysm or vascular malformation. Delayed phase: No abnormal enhancement. Review of the MIP images confirms the above findings IMPRESSION: 1. Short-segment severe near occlusive proximal left ICA stenosis as above. 2. Otherwise normal CTA of the head and neck. No large vessel occlusion. No other high-grade or correctable stenosis. Electronically Signed   By: Jeannine Boga M.D.   On: 04/07/2017 23:02   Ct Angio Neck W And/or Wo Contrast  Result Date: 04/07/2017 CLINICAL DATA:  Initial evaluation for  transient left sided visual loss. Now resolved. EXAM: CT ANGIOGRAPHY HEAD AND NECK TECHNIQUE: Multidetector CT imaging of the head and neck was performed using the standard protocol during bolus administration of intravenous contrast. Multiplanar CT image reconstructions and MIPs were obtained to evaluate the vascular anatomy. Carotid stenosis measurements (when applicable) are obtained utilizing NASCET criteria, using the  distal internal carotid diameter as the denominator. CONTRAST:  17mL ISOVUE-370 IOPAMIDOL (ISOVUE-370) INJECTION 76% COMPARISON:  Prior brain MRI from earlier same day. FINDINGS: CT HEAD FINDINGS Brain: Cerebral volume within normal limits for patient age. No evidence for acute intracranial hemorrhage. No findings to suggest acute large vessel territory infarct. No mass lesion, midline shift, or mass effect. Ventricles are normal in size without evidence for hydrocephalus. No extra-axial fluid collection identified. Vascular: No hyperdense vessel identified. Skull: Scalp soft tissues demonstrate no acute abnormality.Calvarium intact. Subcentimeter calcified nodule at the right frontal scalp noted, of doubtful significance. Sinuses/Orbits: Globes and orbital soft tissues are within normal limits. Visualized paranasal sinuses are clear. No mastoid effusion. CTA NECK FINDINGS Aortic arch: Aortic arch of normal caliber with normal branch pattern. No flow-limiting stenosis about the origin of the great vessels. Minimal plaque at the origin left subclavian artery noted. Visualized subclavian artery is widely patent. Right carotid system: Right common and internal carotid arteries widely patent without stenosis, dissection, or occlusion. Mild atheromatous plaque about the right bifurcation without stenosis. Left carotid system: Left common carotid artery widely patent from its origin to the bifurcation. There is a short-segment severe near occlusive stenosis involving the proximal left ICA (series 13, image 213). Stenosis measures approximately 4 mm in length, and is positioned approximately 13 mm distal to the bifurcation. Left ICA patent distally to the skullbase without additional stenosis, dissection, or occlusion. Vertebral arteries: Both of the vertebral arteries arise from the subclavian arteries. Vertebral arteries widely patent within the neck without stenosis, dissection, or occlusion. Skeleton: No acute osseus  abnormality. No worrisome lytic or blastic osseous lesions. Mild degenerate spondylolysis noted at C3-4 through C5-6. Other neck: No acute soft tissue abnormality within the neck. No adenopathy. Salivary glands normal. Thyroid normal. Upper chest: Visualized upper chest within normal limits. Visualized lungs are grossly clear. Review of the MIP images confirms the above findings CTA HEAD FINDINGS Anterior circulation: Internal carotid artery is widely patent to the termini without flow-limiting stenosis. Minimal focal plaque noted within the cavernous right ICA without stenosis. Origin of the ophthalmic artery is not well seen. A1 segments patent. Left A1 hypoplastic. Normal anterior communicating artery. Anterior cerebral artery is well opacified to their distal aspects without flow-limiting stenosis. Patent M1 segments without stenosis. Normal MCA bifurcations. Distal MCA branches well perfused and symmetric. Posterior circulation: Vertebral arteries widely patent to the vertebrobasilar junction. Patent left PICA. Dominant right AICA. Basilar widely patent to its distal aspect. Superior cerebral arteries patent bilaterally. Both of the posterior cerebral artery supplied via the basilar and are well perfused to their distal aspects without stenosis. Venous sinuses: Patent. Anatomic variants: None significant. No aneurysm or vascular malformation. Delayed phase: No abnormal enhancement. Review of the MIP images confirms the above findings IMPRESSION: 1. Short-segment severe near occlusive proximal left ICA stenosis as above. 2. Otherwise normal CTA of the head and neck. No large vessel occlusion. No other high-grade or correctable stenosis. Electronically Signed   By: Jeannine Boga M.D.   On: 04/07/2017 23:02   Mr Brain Wo Contrast  Result Date: 04/07/2017 CLINICAL DATA:  5 minutes episode of LEFT  vision loss last night. History of hypertension and hyperlipidemia. EXAM: MRI HEAD WITHOUT CONTRAST  TECHNIQUE: Multiplanar, multiecho pulse sequences of the brain and surrounding structures were obtained without intravenous contrast. COMPARISON:  None. FINDINGS: BRAIN: No reduced diffusion to suggest acute ischemia or hyperacute demyelination. No susceptibility artifact to suggest hemorrhage. The ventricles and sulci are normal for patient's age. No suspicious parenchymal signal, mass or mass effect. No abnormal extra-axial fluid collections. VASCULAR: Normal major intracranial vascular flow voids present at skull base. SKULL AND UPPER CERVICAL SPINE: No abnormal sellar expansion. No suspicious calvarial bone marrow signal. Craniocervical junction maintained. SINUSES/ORBITS: The mastoid air-cells and included paranasal sinuses are well-aerated. The included ocular globes and orbital contents are non-suspicious. OTHER: None. IMPRESSION: Normal noncontrast MRI head. Electronically Signed   By: Elon Alas M.D.   On: 04/07/2017 19:53      Subjective: No recurrence of left eye blindness. Denied any other complaints. No strokelike symptoms.  Discharge Exam:  Vitals:   04/08/17 0700 04/08/17 0800 04/08/17 1015 04/08/17 1045  BP: 115/76 139/88    Pulse: 70 82 80 70  Resp: 17 (!) 23 (!) 26 14  Temp:      TempSrc:      SpO2: 94% 95% 96% 93%  Weight:      Height:        General: Pt lying comfortably in bed & appears in no obvious distress. Cardiovascular: S1 & S2 heard, RRR, S1/S2 +. No murmurs, rubs, gallops or clicks. No JVD or pedal edema. Telemetry: Sinus rhythm. Respiratory: Clear to auscultation without wheezing, rhonchi or crackles. No increased work of breathing. Abdominal:  Non distended, non tender & soft. No organomegaly or masses appreciated. Normal bowel sounds heard. CNS: Alert and oriented. No focal deficits. Extremities: no edema, no cyanosis    The results of significant diagnostics from this hospitalization (including imaging, microbiology, ancillary and laboratory) are  listed below for reference.      Labs: CBC: Recent Labs  Lab 04/07/17 1617 04/07/17 1654  WBC 14.8*  --   NEUTROABS 10.4*  --   HGB 16.2 16.7  HCT 44.5 49.0  MCV 89.9  --   PLT 299  --    Basic Metabolic Panel: Recent Labs  Lab 04/07/17 1617 04/07/17 1654  NA 137 138  K 3.7 3.7  CL 104 102  CO2 24  --   GLUCOSE 116* 115*  BUN 11 12  CREATININE 0.78 0.80  CALCIUM 9.2  --    Liver Function Tests: Recent Labs  Lab 04/07/17 1617  AST 27  ALT 39  ALKPHOS 110  BILITOT 0.7  PROT 7.5  ALBUMIN 4.1   CBG: Recent Labs  Lab 04/07/17 2051  GLUCAP 114*   Lipid Profile Recent Labs    04/08/17 0219  CHOL 113  HDL 41  LDLCALC 52  TRIG 101  CHOLHDL 2.8       Time coordinating discharge: Over 30 minutes  SIGNED:  Vernell Leep, MD, FACP, [facility]. [facility] Pager [phone]  If 7PM-7AM, please contact night-coverage [email or web address] Password [facility] 04/08/2017, 6:15 PM

## 2017-04-08 NOTE — ED Notes (Addendum)
Dr. Hongalgi at bedside  

## 2017-04-08 NOTE — Consult Note (Signed)
Requesting Physician: Dr. Tyrone Nine    Chief Complaint: Sudden onset transient loss of vision in left eye  History obtained from: Patient and Chart    HPI:                                                                                                                                       Dean Alvarado is an 61 y.o. male past medical history for hypertension, hyperlipidemia and current tobacco use presents to the emergency room after having a transient episode of loss of vision in the left eye lasting for about 4 minutes on Sunday around 8 PM. He states he was watching TV when he suddenly noticed his left eye went completely black except for small portion at the top of his visual field. He started rubbing his eyes, washed his face,  closed his left eye to see that his right eye was affected. States that by the time he figured what was going on his vision returned. He decided to come to the emergency room today to have this evaluated. MRI was performed which showed no stroke. Neurology was consulted who recommended getting a CT angiogram which showed critical stenosis of the left proximal ICA.    Past Medical History:  Diagnosis Date  . Allergy   . Hyperlipidemia   . Hypertension     Past Surgical History:  Procedure Laterality Date  . COLONOSCOPY  09-13-09   per Dr. Sharlett Iles, diverticulosis only, repeat in 10 yrs   . plastic surgery left eye      Family History  Problem Relation Age of Onset  . Diabetes Unknown   . Hypertension Unknown    Social History:  reports that he has been smoking.  he has never used smokeless tobacco. He reports that he drinks alcohol. He reports that he does not use drugs.  Allergies: No Known Allergies  Medications:                                                                                                                        I reviewed home medications.   ROS:  14 systems reviewed and negative except above    Examination:                                                                                                      General: Appears well-developed and well-nourished.  Psych: Affect appropriate to situation Eyes: No scleral injection HENT: No OP obstrucion Head: Normocephalic.  Cardiovascular: Normal rate and regular rhythm.  Respiratory: Effort normal and breath sounds normal to anterior ascultation GI: Soft.  No distension. There is no tenderness.  Skin: WDI   Neurological Examination Mental Status: Alert, oriented, thought content appropriate.  Speech fluent without evidence of aphasia. Able to follow 3 step commands without difficulty. Cranial Nerves: II: Visual fields grossly normal,  III,IV, VI: ptosis not present, extra-ocular motions intact bilaterally, pupils equal, round, reactive to light and accommodation V,VII: smile symmetric, facial light touch sensation normal bilaterally VIII: hearing normal bilaterally IX,X: uvula rises symmetrically XI: bilateral shoulder shrug XII: midline tongue extension Motor: Right : Upper extremity   5/5    Left:     Upper extremity   5/5  Lower extremity   5/5     Lower extremity   5/5 Tone and bulk:normal tone throughout; no atrophy noted Sensory: Pinprick and light touch intact throughout, bilaterally Deep Tendon Reflexes: 2+ and symmetric throughout Plantars: Right: downgoing   Left: downgoing Cerebellar: normal finger-to-nose, normal rapid alternating movements and normal heel-to-shin test Gait: normal gait and station     Lab Results: Basic Metabolic Panel: Recent Labs  Lab 04/07/17 1617 04/07/17 1654  NA 137 138  K 3.7 3.7  CL 104 102  CO2 24  --   GLUCOSE 116* 115*  BUN 11 12  CREATININE 0.78 0.80  CALCIUM 9.2  --     CBC: Recent Labs  Lab 04/07/17 1617 04/07/17 1654  WBC 14.8*  --   NEUTROABS 10.4*  --   HGB  16.2 16.7  HCT 44.5 49.0  MCV 89.9  --   PLT 299  --     Coagulation Studies: Recent Labs    04/07/17 1617  LABPROT 12.7  INR 0.96    Imaging: Ct Angio Head W Or Wo Contrast  Result Date: 04/07/2017 CLINICAL DATA:  Initial evaluation for transient left sided visual loss. Now resolved. EXAM: CT ANGIOGRAPHY HEAD AND NECK TECHNIQUE: Multidetector CT imaging of the head and neck was performed using the standard protocol during bolus administration of intravenous contrast. Multiplanar CT image reconstructions and MIPs were obtained to evaluate the vascular anatomy. Carotid stenosis measurements (when applicable) are obtained utilizing NASCET criteria, using the distal internal carotid diameter as the denominator. CONTRAST:  18mL ISOVUE-370 IOPAMIDOL (ISOVUE-370) INJECTION 76% COMPARISON:  Prior brain MRI from earlier same day. FINDINGS: CT HEAD FINDINGS Brain: Cerebral volume within normal limits for patient age. No evidence for acute intracranial hemorrhage. No findings to suggest acute large vessel territory infarct. No mass lesion, midline shift, or mass effect. Ventricles are normal in size without evidence for hydrocephalus. No extra-axial fluid collection identified. Vascular: No hyperdense vessel identified. Skull: Scalp soft tissues  demonstrate no acute abnormality.Calvarium intact. Subcentimeter calcified nodule at the right frontal scalp noted, of doubtful significance. Sinuses/Orbits: Globes and orbital soft tissues are within normal limits. Visualized paranasal sinuses are clear. No mastoid effusion. CTA NECK FINDINGS Aortic arch: Aortic arch of normal caliber with normal branch pattern. No flow-limiting stenosis about the origin of the great vessels. Minimal plaque at the origin left subclavian artery noted. Visualized subclavian artery is widely patent. Right carotid system: Right common and internal carotid arteries widely patent without stenosis, dissection, or occlusion. Mild  atheromatous plaque about the right bifurcation without stenosis. Left carotid system: Left common carotid artery widely patent from its origin to the bifurcation. There is a short-segment severe near occlusive stenosis involving the proximal left ICA (series 13, image 213). Stenosis measures approximately 4 mm in length, and is positioned approximately 13 mm distal to the bifurcation. Left ICA patent distally to the skullbase without additional stenosis, dissection, or occlusion. Vertebral arteries: Both of the vertebral arteries arise from the subclavian arteries. Vertebral arteries widely patent within the neck without stenosis, dissection, or occlusion. Skeleton: No acute osseus abnormality. No worrisome lytic or blastic osseous lesions. Mild degenerate spondylolysis noted at C3-4 through C5-6. Other neck: No acute soft tissue abnormality within the neck. No adenopathy. Salivary glands normal. Thyroid normal. Upper chest: Visualized upper chest within normal limits. Visualized lungs are grossly clear. Review of the MIP images confirms the above findings CTA HEAD FINDINGS Anterior circulation: Internal carotid artery is widely patent to the termini without flow-limiting stenosis. Minimal focal plaque noted within the cavernous right ICA without stenosis. Origin of the ophthalmic artery is not well seen. A1 segments patent. Left A1 hypoplastic. Normal anterior communicating artery. Anterior cerebral artery is well opacified to their distal aspects without flow-limiting stenosis. Patent M1 segments without stenosis. Normal MCA bifurcations. Distal MCA branches well perfused and symmetric. Posterior circulation: Vertebral arteries widely patent to the vertebrobasilar junction. Patent left PICA. Dominant right AICA. Basilar widely patent to its distal aspect. Superior cerebral arteries patent bilaterally. Both of the posterior cerebral artery supplied via the basilar and are well perfused to their distal aspects  without stenosis. Venous sinuses: Patent. Anatomic variants: None significant. No aneurysm or vascular malformation. Delayed phase: No abnormal enhancement. Review of the MIP images confirms the above findings IMPRESSION: 1. Short-segment severe near occlusive proximal left ICA stenosis as above. 2. Otherwise normal CTA of the head and neck. No large vessel occlusion. No other high-grade or correctable stenosis. Electronically Signed   By: Jeannine Boga M.D.   On: 04/07/2017 23:02   Ct Angio Neck W And/or Wo Contrast  Result Date: 04/07/2017 CLINICAL DATA:  Initial evaluation for transient left sided visual loss. Now resolved. EXAM: CT ANGIOGRAPHY HEAD AND NECK TECHNIQUE: Multidetector CT imaging of the head and neck was performed using the standard protocol during bolus administration of intravenous contrast. Multiplanar CT image reconstructions and MIPs were obtained to evaluate the vascular anatomy. Carotid stenosis measurements (when applicable) are obtained utilizing NASCET criteria, using the distal internal carotid diameter as the denominator. CONTRAST:  53mL ISOVUE-370 IOPAMIDOL (ISOVUE-370) INJECTION 76% COMPARISON:  Prior brain MRI from earlier same day. FINDINGS: CT HEAD FINDINGS Brain: Cerebral volume within normal limits for patient age. No evidence for acute intracranial hemorrhage. No findings to suggest acute large vessel territory infarct. No mass lesion, midline shift, or mass effect. Ventricles are normal in size without evidence for hydrocephalus. No extra-axial fluid collection identified. Vascular: No hyperdense vessel identified. Skull: Scalp soft  tissues demonstrate no acute abnormality.Calvarium intact. Subcentimeter calcified nodule at the right frontal scalp noted, of doubtful significance. Sinuses/Orbits: Globes and orbital soft tissues are within normal limits. Visualized paranasal sinuses are clear. No mastoid effusion. CTA NECK FINDINGS Aortic arch: Aortic arch of normal  caliber with normal branch pattern. No flow-limiting stenosis about the origin of the great vessels. Minimal plaque at the origin left subclavian artery noted. Visualized subclavian artery is widely patent. Right carotid system: Right common and internal carotid arteries widely patent without stenosis, dissection, or occlusion. Mild atheromatous plaque about the right bifurcation without stenosis. Left carotid system: Left common carotid artery widely patent from its origin to the bifurcation. There is a short-segment severe near occlusive stenosis involving the proximal left ICA (series 13, image 213). Stenosis measures approximately 4 mm in length, and is positioned approximately 13 mm distal to the bifurcation. Left ICA patent distally to the skullbase without additional stenosis, dissection, or occlusion. Vertebral arteries: Both of the vertebral arteries arise from the subclavian arteries. Vertebral arteries widely patent within the neck without stenosis, dissection, or occlusion. Skeleton: No acute osseus abnormality. No worrisome lytic or blastic osseous lesions. Mild degenerate spondylolysis noted at C3-4 through C5-6. Other neck: No acute soft tissue abnormality within the neck. No adenopathy. Salivary glands normal. Thyroid normal. Upper chest: Visualized upper chest within normal limits. Visualized lungs are grossly clear. Review of the MIP images confirms the above findings CTA HEAD FINDINGS Anterior circulation: Internal carotid artery is widely patent to the termini without flow-limiting stenosis. Minimal focal plaque noted within the cavernous right ICA without stenosis. Origin of the ophthalmic artery is not well seen. A1 segments patent. Left A1 hypoplastic. Normal anterior communicating artery. Anterior cerebral artery is well opacified to their distal aspects without flow-limiting stenosis. Patent M1 segments without stenosis. Normal MCA bifurcations. Distal MCA branches well perfused and  symmetric. Posterior circulation: Vertebral arteries widely patent to the vertebrobasilar junction. Patent left PICA. Dominant right AICA. Basilar widely patent to its distal aspect. Superior cerebral arteries patent bilaterally. Both of the posterior cerebral artery supplied via the basilar and are well perfused to their distal aspects without stenosis. Venous sinuses: Patent. Anatomic variants: None significant. No aneurysm or vascular malformation. Delayed phase: No abnormal enhancement. Review of the MIP images confirms the above findings IMPRESSION: 1. Short-segment severe near occlusive proximal left ICA stenosis as above. 2. Otherwise normal CTA of the head and neck. No large vessel occlusion. No other high-grade or correctable stenosis. Electronically Signed   By: Jeannine Boga M.D.   On: 04/07/2017 23:02   Mr Brain Wo Contrast  Result Date: 04/07/2017 CLINICAL DATA:  5 minutes episode of LEFT vision loss last night. History of hypertension and hyperlipidemia. EXAM: MRI HEAD WITHOUT CONTRAST TECHNIQUE: Multiplanar, multiecho pulse sequences of the brain and surrounding structures were obtained without intravenous contrast. COMPARISON:  None. FINDINGS: BRAIN: No reduced diffusion to suggest acute ischemia or hyperacute demyelination. No susceptibility artifact to suggest hemorrhage. The ventricles and sulci are normal for patient's age. No suspicious parenchymal signal, mass or mass effect. No abnormal extra-axial fluid collections. VASCULAR: Normal major intracranial vascular flow voids present at skull base. SKULL AND UPPER CERVICAL SPINE: No abnormal sellar expansion. No suspicious calvarial bone marrow signal. Craniocervical junction maintained. SINUSES/ORBITS: The mastoid air-cells and included paranasal sinuses are well-aerated. The included ocular globes and orbital contents are non-suspicious. OTHER: None. IMPRESSION: Normal noncontrast MRI head. Electronically Signed   By: Elon Alas M.D.   On:  04/07/2017 19:53     ASSESSMENT AND PLAN  History Helaman is a 61 year old male past medical history of hypertension, hyperlipidemia, tobacco abuse who presents with sudden onset loss of vision in left eye ostium for 4 minutes. Vascular imaging with CT angiogram reveals a focal area of high-grade stenosis in the proximal left ICA about 1 cm distal to bifurcation. Discussed at length risks/benefits of carotid stenting versus CEA as he was good candidate for both procedures. Patient preferred to have CEA done, our stated he will discuss it with his brother-in-law who is a Publishing rights manager. Plan to have Vascular surgery consulted first thing tomorrow for early revascularization, preferably as an inpatient. MRI negative for acute stroke.   Amaurosis fugax Left symptomatic high-grade carotid stenosis  Risk factors: HTN, HLD, smoking   Recommend  #Carotid ultrasound  #Transthoracic Echo  # Start patient on ASA 325mg  daily #Start or continue Atorvastatin 80 mg/other high intensity statin # BP goal: permissive HTN upto 962 systolic, PRNs above 229 # HBAIC and Lipid profile # Telemetry monitoring # Frequent neuro checks # NPO until passes stroke swallow screen #Vascular surgery consult  #smoking cessation   Please page stroke NP  Or  PA  Or MD from 8am -4 pm  as this patient from this time will be  followed by the stroke.   You can look them up on [email or web address]  Password [facility]    Sushanth Aroor [facility] Pager Number [phone]

## 2017-04-08 NOTE — H&P (View-Only) (Signed)
Patient name: Dean Alvarado MRN: [id number] DOB: Jul 14, 1955 Sex: male   REASON FOR CONSULT:    Symptomatic left carotid stenosis.  The consult is requested by Dr. Algis Liming.  HPI:   Dean Alvarado is a pleasant 61 y.o. male, who developed the sudden onset of loss of vision in the left eye 2 days ago on Sunday.  This was at 8 PM.  His symptoms lasted about 4 minutes.  He came in Monday to the emergency department and he has been down in the emergency room since that time.  He is waiting on a bed up reportedly.  His visual disturbance completely resolved after about 4 minutes.  He had no associated weakness or numbness.  He denies any history of stroke, TIAs, expressive or receptive aphasia.  He was not on aspirin.  He was on a statin  His risk factors for peripheral vascular disease include hypertension, hypercholesterolemia, and a history of tobacco use.  He denies any history of diabetes, or family history of premature cardiovascular disease.  Past Medical History:  Diagnosis Date  . Allergy   . Hyperlipidemia   . Hypertension     Family History  Problem Relation Age of Onset  . Diabetes Unknown   . Hypertension Unknown    He denies any family history of premature cardiovascular disease.  SOCIAL HISTORY: He smokes 1 pack/day of cigarettes and has been smoking for as long as he can remember. Social History   Socioeconomic History  . Marital status: Married    Spouse name: Not on file  . Number of children: Not on file  . Years of education: Not on file  . Highest education level: Not on file  Social Needs  . Financial resource strain: Not on file  . Food insecurity - worry: Not on file  . Food insecurity - inability: Not on file  . Transportation needs - medical: Not on file  . Transportation needs - non-medical: Not on file  Occupational History  . Not on file  Tobacco Use  . Smoking status: Current Every Day Smoker  . Smokeless tobacco: Never Used  . Tobacco  comment: 1/2 pack per day for the last year  Substance and Sexual Activity  . Alcohol use: Yes    Alcohol/week: 0.0 oz    Comment: occ  . Drug use: No  . Sexual activity: Not on file  Other Topics Concern  . Not on file  Social History Narrative  . Not on file    No Known Allergies  Current Facility-Administered Medications  Medication Dose Route Frequency Provider Last Rate Last Dose  .  stroke: mapping our early stages of recovery book   Does not apply Once Derrill Kay A, MD      . 0.9 %  sodium chloride infusion   Intravenous Continuous Rosalin Hawking, MD 100 mL/hr at 04/08/17 0803    . acetaminophen (TYLENOL) tablet 650 mg  650 mg Oral Q4H PRN Phillips Grout, MD       Or  . acetaminophen (TYLENOL) solution 650 mg  650 mg Per Tube Q4H PRN Phillips Grout, MD       Or  . acetaminophen (TYLENOL) suppository 650 mg  650 mg Rectal Q4H PRN Phillips Grout, MD      . aspirin suppository 300 mg  300 mg Rectal Daily Derrill Kay A, MD       Or  . aspirin tablet 325 mg  325 mg Oral Daily  Phillips Grout, MD   325 mg at 04/08/17 0815  . atorvastatin (LIPITOR) tablet 40 mg  40 mg Oral Daily Derrill Kay A, MD   40 mg at 04/08/17 0815  . LORazepam (ATIVAN) tablet 1 mg  1 mg Oral Q6H PRN Phillips Grout, MD       Current Outpatient Medications  Medication Sig Dispense Refill  . atorvastatin (LIPITOR) 20 MG tablet TAKE 1 TABLET BY MOUTH EVERY DAY (Patient taking differently: TAKE 1 TABLET (20mg )BY MOUTH EVERY DAY) 90 tablet 3  . ibuprofen (ADVIL,MOTRIN) 200 MG tablet Take 200 mg by mouth daily.    Marland Kitchen lisinopril-hydrochlorothiazide (PRINZIDE,ZESTORETIC) 10-12.5 MG tablet TAKE 1 TABLET BY MOUTH EVERY DAY 90 tablet 3  . LORazepam (ATIVAN) 1 MG tablet TAKE 1 TABLET EVERY 6 HOURS AS NEEDED (Patient taking differently: TAKE 1 TABLET EVERY 6 HOURS AS NEEDED ANXIETY) 60 tablet 5    REVIEW OF SYSTEMS:  [X]  denotes positive finding, [ ]  denotes negative finding Cardiac  Comments:  Chest pain or  chest pressure:    Shortness of breath upon exertion:    Short of breath when lying flat:    Irregular heart rhythm:        Vascular    Pain in calf, thigh, or hip brought on by ambulation:    Pain in feet at night that wakes you up from your sleep:     Blood clot in your veins:    Leg swelling:         Pulmonary    Oxygen at home:    Productive cough:     Wheezing:         Neurologic    Sudden weakness in arms or legs:     Sudden numbness in arms or legs:     Sudden onset of difficulty speaking or slurred speech:    Temporary loss of vision in one eye:  X   Problems with dizziness:         Gastrointestinal    Blood in stool:     Vomited blood:         Genitourinary    Burning when urinating:     Blood in urine:        Psychiatric    Major depression:         Hematologic    Bleeding problems:    Problems with blood clotting too easily:        Skin    Rashes or ulcers:        Constitutional    Fever or chills:     PHYSICAL EXAM:   Vitals:   04/08/17 0500 04/08/17 0600 04/08/17 0700 04/08/17 0800  BP: 116/77 114/86 115/76 139/88  Pulse: 69 71 70 82  Resp: 18 16 17  (!) 23  Temp:      TempSrc:      SpO2: 95% 93% 94% 95%  Weight:      Height:        GENERAL: The patient is a well-nourished male, in no acute distress. The vital signs are documented above. CARDIAC: There is a regular rate and rhythm.  VASCULAR: He has a left carotid bruit. He has palpable femoral, popliteal, dorsalis pedis, and posterior tibial pulses bilaterally. He has no significant lower extremity swelling. PULMONARY: There is good air exchange bilaterally without wheezing or rales. ABDOMEN: Soft and non-tender with normal pitched bowel sounds.  I do not palpate an abdominal aortic aneurysm. MUSCULOSKELETAL: There are no major deformities  or cyanosis. NEUROLOGIC: No focal weakness or paresthesias are detected. SKIN: There are no ulcers or rashes noted. PSYCHIATRIC: The patient has a  normal affect.  DATA:    CAROTID DUPLEX: I have independently interpreted his carotid duplex scan which shows a greater than 80% left carotid stenosis.  Peak systolic velocity on the left is 507 cm/s.  End-diastolic velocity is 102 cm/s.  The stenosis is in the mid internal carotid artery.  He has no right carotid stenosis.  Both vertebral arteries are patent with antegrade flow.  CT ANGIOGRAM NECK:   This shows a severe short segment stenosis of the proximal left internal carotid artery.  Based on his CT angiogram, the stenosis does appear to be surgically accessible.   ECHO: This has been ordered.   MEDICAL ISSUES:   SYMPTOMATIC LEFT CAROTID STENOSIS: This patient has an asymptomatic greater than 80% left carotid stenosis.  I have recommended left carotid endarterectomy.  I have reviewed the indications for carotid endarterectomy, that is to lower the risk of future stroke. I have also reviewed the potential complications of surgery, including but not limited to: bleeding, stroke (perioperative risk 1-2%), MI, nerve injury of other unpredictable medical problems. All of the patients questions were answered and they are agreeable to proceed with surgery.  I have tentatively scheduled him for surgery on Thursday morning.  I believe that neurology would like to have him admitted and I have paged them to discuss this.  The patient would prefer to go home.  As he was not on aspirin prior to this admission, I do not think this is unreasonable.  We have also discussed the importance of tobacco cessation.     Deitra Mayo [facility] [phone]

## 2017-04-08 NOTE — Progress Notes (Signed)
*  PRELIMINARY RESULTS* Vascular Ultrasound Carotid Duplex (Doppler) has been completed.  Findings suggest 1-39% right internal carotid artery stenosis, and 80-99% left internal carotid artery stenosis.  The left ICA stenosis is not focal, and measures 1.75cm in length. The beginning of the stenosis is 1.3cm from the bifurcation, the end of the stenosis is 1cm from the mandible. The bifurcation is located at the hyoid. Bilateral vertebral arteries are patent with antegrade flow.  04/08/2017 9:38 AM Maudry Mayhew, BS, RVT, RDCS, RDMS

## 2017-04-09 ENCOUNTER — Other Ambulatory Visit: Payer: Self-pay | Admitting: *Deleted

## 2017-04-09 ENCOUNTER — Encounter (HOSPITAL_COMMUNITY): Payer: Self-pay | Admitting: *Deleted

## 2017-04-09 ENCOUNTER — Other Ambulatory Visit: Payer: Self-pay

## 2017-04-09 ENCOUNTER — Ambulatory Visit: Payer: BLUE CROSS/BLUE SHIELD | Admitting: Family Medicine

## 2017-04-09 LAB — HEMOGLOBIN A1C
Hgb A1c MFr Bld: 6.5 % — ABNORMAL HIGH (ref 4.8–5.6)
Mean Plasma Glucose: 140 mg/dL

## 2017-04-09 NOTE — Progress Notes (Signed)
Pt denies SOB, chest pain, and being under the care of a cardiologist. Pt denies having a stress test and cardiac cath. Pt denies having a chest x ray within the last year. Pt stated that MD advised that he not take Aspirin on DOS. Pt made aware to stop taking vitamins, fish oil and herbal medications. Do not take any NSAIDs ie: Ibuprofen, Advil, Naproxen (Aleve), Motrin, BC and Goody Powder. Pt verbalized understanding of all pre-op instructions.

## 2017-04-10 ENCOUNTER — Inpatient Hospital Stay (HOSPITAL_COMMUNITY): Payer: BLUE CROSS/BLUE SHIELD | Admitting: Anesthesiology

## 2017-04-10 ENCOUNTER — Encounter (HOSPITAL_COMMUNITY): Payer: Self-pay | Admitting: *Deleted

## 2017-04-10 ENCOUNTER — Encounter (HOSPITAL_COMMUNITY)
Admission: RE | Disposition: A | Discharge status: Home / Self Care | Payer: Self-pay | Source: Ambulatory Visit | Attending: Vascular Surgery

## 2017-04-10 ENCOUNTER — Other Ambulatory Visit: Payer: Self-pay

## 2017-04-10 ENCOUNTER — Ambulatory Visit (HOSPITAL_COMMUNITY)
Admission: RE | Admit: 2017-04-10 | Discharge: 2017-04-11 | Disposition: A | Discharge status: Home / Self Care | Payer: BLUE CROSS/BLUE SHIELD | Source: Ambulatory Visit | Attending: Vascular Surgery | Admitting: Vascular Surgery

## 2017-04-10 DIAGNOSIS — E785 Hyperlipidemia, unspecified: Secondary | ICD-10-CM | POA: Diagnosis not present

## 2017-04-10 DIAGNOSIS — I10 Essential (primary) hypertension: Secondary | ICD-10-CM | POA: Insufficient documentation

## 2017-04-10 DIAGNOSIS — F419 Anxiety disorder, unspecified: Secondary | ICD-10-CM | POA: Diagnosis not present

## 2017-04-10 DIAGNOSIS — I6522 Occlusion and stenosis of left carotid artery: Principal | ICD-10-CM | POA: Insufficient documentation

## 2017-04-10 DIAGNOSIS — G453 Amaurosis fugax: Secondary | ICD-10-CM | POA: Diagnosis not present

## 2017-04-10 DIAGNOSIS — Z79899 Other long term (current) drug therapy: Secondary | ICD-10-CM | POA: Diagnosis not present

## 2017-04-10 DIAGNOSIS — Z8673 Personal history of transient ischemic attack (TIA), and cerebral infarction without residual deficits: Secondary | ICD-10-CM | POA: Insufficient documentation

## 2017-04-10 DIAGNOSIS — F17200 Nicotine dependence, unspecified, uncomplicated: Secondary | ICD-10-CM | POA: Insufficient documentation

## 2017-04-10 DIAGNOSIS — Z7982 Long term (current) use of aspirin: Secondary | ICD-10-CM | POA: Diagnosis not present

## 2017-04-10 DIAGNOSIS — I739 Peripheral vascular disease, unspecified: Secondary | ICD-10-CM | POA: Diagnosis not present

## 2017-04-10 DIAGNOSIS — I6529 Occlusion and stenosis of unspecified carotid artery: Secondary | ICD-10-CM | POA: Diagnosis present

## 2017-04-10 HISTORY — DX: Peripheral vascular disease, unspecified: I73.9

## 2017-04-10 HISTORY — DX: Transient cerebral ischemic attack, unspecified: G45.9

## 2017-04-10 HISTORY — DX: Allergic rhinitis due to pollen: J30.1

## 2017-04-10 HISTORY — PX: CAROTID ENDARTERECTOMY: SUR193

## 2017-04-10 HISTORY — DX: Occlusion and stenosis of left carotid artery: I65.22

## 2017-04-10 HISTORY — PX: PATCH ANGIOPLASTY: SHX6230

## 2017-04-10 HISTORY — PX: ENDARTERECTOMY: SHX5162

## 2017-04-10 LAB — URINALYSIS, ROUTINE W REFLEX MICROSCOPIC
Bacteria, UA: NONE SEEN
Bilirubin Urine: NEGATIVE
Glucose, UA: NEGATIVE mg/dL
Ketones, ur: NEGATIVE mg/dL
Leukocytes, UA: NEGATIVE
Nitrite: NEGATIVE
Protein, ur: NEGATIVE mg/dL
Specific Gravity, Urine: 1.021 (ref 1.005–1.030)
Squamous Epithelial / LPF: NONE SEEN
pH: 5 (ref 5.0–8.0)

## 2017-04-10 LAB — CBC
HCT: 42.5 % (ref 39.0–52.0)
Hemoglobin: 14.8 g/dL (ref 13.0–17.0)
MCH: 32.3 pg (ref 26.0–34.0)
MCHC: 34.8 g/dL (ref 30.0–36.0)
MCV: 92.8 fL (ref 78.0–100.0)
Platelets: 265 10*3/uL (ref 150–400)
RBC: 4.58 MIL/uL (ref 4.22–5.81)
RDW: 13.2 % (ref 11.5–15.5)
WBC: 7.3 10*3/uL (ref 4.0–10.5)

## 2017-04-10 LAB — COMPREHENSIVE METABOLIC PANEL
ALT: 32 U/L (ref 17–63)
AST: 26 U/L (ref 15–41)
Albumin: 3.7 g/dL (ref 3.5–5.0)
Alkaline Phosphatase: 87 U/L (ref 38–126)
Anion gap: 7 (ref 5–15)
BUN: 16 mg/dL (ref 6–20)
CO2: 22 mmol/L (ref 22–32)
Calcium: 8.6 mg/dL — ABNORMAL LOW (ref 8.9–10.3)
Chloride: 110 mmol/L (ref 101–111)
Creatinine, Ser: 0.75 mg/dL (ref 0.61–1.24)
GFR calc Af Amer: >60 mL/min (ref >60)
GFR calc non Af Amer: >60 mL/min (ref >60)
Glucose, Bld: 129 mg/dL — ABNORMAL HIGH (ref 65–99)
Potassium: 3.9 mmol/L (ref 3.5–5.1)
Sodium: 139 mmol/L (ref 135–145)
Total Bilirubin: 1.1 mg/dL (ref 0.3–1.2)
Total Protein: 6.6 g/dL (ref 6.5–8.1)

## 2017-04-10 LAB — PROTIME-INR
INR: 1.08
Prothrombin Time: 13.9 seconds (ref 11.4–15.2)

## 2017-04-10 LAB — ABO/RH: ABO/RH(D): A POS

## 2017-04-10 LAB — TYPE AND SCREEN
ABO/RH(D): A POS
Antibody Screen: NEGATIVE

## 2017-04-10 LAB — APTT: aPTT: 30 seconds (ref 24–36)

## 2017-04-10 SURGERY — ENDARTERECTOMY, CAROTID
Anesthesia: General | Site: Neck | Laterality: Left

## 2017-04-10 MED ORDER — ROCURONIUM BROMIDE 10 MG/ML (PF) SYRINGE
PREFILLED_SYRINGE | INTRAVENOUS | Status: DC | PRN
  Administered 2017-04-10: 50 mg via INTRAVENOUS
  Administered 2017-04-10: 20 mg via INTRAVENOUS

## 2017-04-10 MED ORDER — EPHEDRINE SULFATE-NACL 50-0.9 MG/10ML-% IV SOSY
PREFILLED_SYRINGE | INTRAVENOUS | Status: DC | PRN
  Administered 2017-04-10: 5 mg via INTRAVENOUS

## 2017-04-10 MED ORDER — METOPROLOL TARTRATE 5 MG/5ML IV SOLN: 2.0000 mg | INTRAVENOUS | Status: DC | PRN

## 2017-04-10 MED ORDER — ACETAMINOPHEN 650 MG RE SUPP: 325.0000 mg | RECTAL | Status: DC | PRN

## 2017-04-10 MED ORDER — ONDANSETRON HCL 4 MG/2ML IJ SOLN: 4.0000 mg | Freq: Four times a day (QID) | INTRAMUSCULAR | Status: DC | PRN

## 2017-04-10 MED ORDER — LACTATED RINGERS IV SOLN
INTRAVENOUS | Status: DC | PRN
  Administered 2017-04-10 (×2): via INTRAVENOUS

## 2017-04-10 MED ORDER — PROPOFOL 10 MG/ML IV BOLUS
INTRAVENOUS | Status: AC
  Filled 2017-04-10: qty 20

## 2017-04-10 MED ORDER — CEFUROXIME SODIUM 1.5 G IV SOLR
1.5000 g | INTRAVENOUS | Status: AC
  Administered 2017-04-10: 1.5 g via INTRAVENOUS

## 2017-04-10 MED ORDER — PROPOFOL 10 MG/ML IV BOLUS
INTRAVENOUS | Status: DC | PRN
  Administered 2017-04-10: 200 mg via INTRAVENOUS

## 2017-04-10 MED ORDER — ROCURONIUM BROMIDE 10 MG/ML (PF) SYRINGE
PREFILLED_SYRINGE | INTRAVENOUS | Status: AC
  Filled 2017-04-10: qty 5

## 2017-04-10 MED ORDER — DOPAMINE-DEXTROSE 3.2-5 MG/ML-% IV SOLN
INTRAVENOUS | Status: AC
  Filled 2017-04-10: qty 250

## 2017-04-10 MED ORDER — DEXTRAN 40 IN D5W 10 % IV SOLN
INTRAVENOUS | Status: AC
  Filled 2017-04-10: qty 500

## 2017-04-10 MED ORDER — MIDAZOLAM HCL 2 MG/2ML IJ SOLN
INTRAMUSCULAR | Status: DC | PRN
  Administered 2017-04-10: 2 mg via INTRAVENOUS

## 2017-04-10 MED ORDER — DEXTRAN 40 IN SALINE 10-0.9 % IV SOLN
INTRAVENOUS | Status: AC | PRN
  Administered 2017-04-10: 25 mL/h

## 2017-04-10 MED ORDER — PROTAMINE SULFATE 10 MG/ML IV SOLN
INTRAVENOUS | Status: DC | PRN
  Administered 2017-04-10: 40 mg via INTRAVENOUS

## 2017-04-10 MED ORDER — SODIUM CHLORIDE 0.9 % IV BOLUS (SEPSIS)
500.0000 mL | Freq: Once | INTRAVENOUS | Status: AC
  Administered 2017-04-10: 500 mL via INTRAVENOUS

## 2017-04-10 MED ORDER — DOCUSATE SODIUM 100 MG PO CAPS
100.0000 mg | ORAL_CAPSULE | Freq: Every day | ORAL | Status: DC
  Filled 2017-04-10: qty 1

## 2017-04-10 MED ORDER — PHENYLEPHRINE 40 MCG/ML (10ML) SYRINGE FOR IV PUSH (FOR BLOOD PRESSURE SUPPORT)
PREFILLED_SYRINGE | INTRAVENOUS | Status: AC
  Filled 2017-04-10: qty 10

## 2017-04-10 MED ORDER — MEPERIDINE HCL 25 MG/ML IJ SOLN: 6.2500 mg | INTRAMUSCULAR | Status: DC | PRN

## 2017-04-10 MED ORDER — DEXTROSE 5 % IV SOLN
INTRAVENOUS | Status: AC
  Filled 2017-04-10: qty 1.5

## 2017-04-10 MED ORDER — LIDOCAINE-EPINEPHRINE (PF) 1 %-1:200000 IJ SOLN
INTRAMUSCULAR | Status: DC | PRN
Start: 2017-04-10 — End: 2017-04-10
  Administered 2017-04-10: 10 mL via INTRADERMAL

## 2017-04-10 MED ORDER — LABETALOL HCL 5 MG/ML IV SOLN: 10.0000 mg | INTRAVENOUS | Status: DC | PRN

## 2017-04-10 MED ORDER — POLYETHYLENE GLYCOL 3350 17 G PO PACK: 17.0000 g | PACK | Freq: Every day | ORAL | Status: DC | PRN

## 2017-04-10 MED ORDER — THROMBIN (RECOMBINANT) 5000 UNITS EX SOLR
CUTANEOUS | Status: AC
  Filled 2017-04-10: qty 5000

## 2017-04-10 MED ORDER — LISINOPRIL-HYDROCHLOROTHIAZIDE 10-12.5 MG PO TABS: 1.0000 | ORAL_TABLET | Freq: Every day | ORAL | Status: DC

## 2017-04-10 MED ORDER — ALUM & MAG HYDROXIDE-SIMETH 200-200-20 MG/5ML PO SUSP: 15.0000 mL | ORAL | Status: DC | PRN

## 2017-04-10 MED ORDER — MORPHINE SULFATE (PF) 4 MG/ML IV SOLN: 4.0000 mg | INTRAVENOUS | Status: DC | PRN

## 2017-04-10 MED ORDER — PANTOPRAZOLE SODIUM 40 MG PO TBEC
40.0000 mg | DELAYED_RELEASE_TABLET | Freq: Every day | ORAL | Status: DC
  Administered 2017-04-10 – 2017-04-11 (×2): 40 mg via ORAL
  Filled 2017-04-10 (×2): qty 1

## 2017-04-10 MED ORDER — GUAIFENESIN-DM 100-10 MG/5ML PO SYRP: 15.0000 mL | ORAL_SOLUTION | ORAL | Status: DC | PRN

## 2017-04-10 MED ORDER — MAGNESIUM SULFATE 2 GM/50ML IV SOLN
2.0000 g | Freq: Every day | INTRAVENOUS | Status: DC | PRN
  Filled 2017-04-10: qty 50

## 2017-04-10 MED ORDER — ONDANSETRON HCL 4 MG/2ML IJ SOLN: 4.0000 mg | Freq: Once | INTRAMUSCULAR | Status: DC | PRN

## 2017-04-10 MED ORDER — DOPAMINE-DEXTROSE 3.2-5 MG/ML-% IV SOLN
2.0000 ug/kg/min | INTRAVENOUS | Status: DC
  Administered 2017-04-10: 5 ug/kg/min via INTRAVENOUS

## 2017-04-10 MED ORDER — LIDOCAINE 2% (20 MG/ML) 5 ML SYRINGE
INTRAMUSCULAR | Status: DC | PRN
  Administered 2017-04-10: 100 mg via INTRAVENOUS

## 2017-04-10 MED ORDER — ONDANSETRON HCL 4 MG/2ML IJ SOLN
INTRAMUSCULAR | Status: DC | PRN
  Administered 2017-04-10: 4 mg via INTRAVENOUS

## 2017-04-10 MED ORDER — ONDANSETRON HCL 4 MG/2ML IJ SOLN
INTRAMUSCULAR | Status: AC
  Filled 2017-04-10: qty 2

## 2017-04-10 MED ORDER — CHLORHEXIDINE GLUCONATE CLOTH 2 % EX PADS: 6.0000 | MEDICATED_PAD | Freq: Once | CUTANEOUS | Status: DC

## 2017-04-10 MED ORDER — DEXAMETHASONE SODIUM PHOSPHATE 10 MG/ML IJ SOLN
INTRAMUSCULAR | Status: DC | PRN
  Administered 2017-04-10: 10 mg via INTRAVENOUS

## 2017-04-10 MED ORDER — LIDOCAINE HCL (PF) 1 % IJ SOLN
INTRAMUSCULAR | Status: AC
  Filled 2017-04-10: qty 30

## 2017-04-10 MED ORDER — SODIUM CHLORIDE 0.9 % IV SOLN
INTRAVENOUS | Status: DC
  Administered 2017-04-10: 17:00:00 via INTRAVENOUS

## 2017-04-10 MED ORDER — MIDAZOLAM HCL 2 MG/2ML IJ SOLN
INTRAMUSCULAR | Status: AC
  Filled 2017-04-10: qty 2

## 2017-04-10 MED ORDER — PHENOL 1.4 % MT LIQD: 1.0000 | OROMUCOSAL | Status: DC | PRN

## 2017-04-10 MED ORDER — SUGAMMADEX SODIUM 200 MG/2ML IV SOLN
INTRAVENOUS | Status: AC
  Filled 2017-04-10: qty 2

## 2017-04-10 MED ORDER — OXYCODONE-ACETAMINOPHEN 5-325 MG PO TABS: 1.0000 | ORAL_TABLET | ORAL | Status: DC | PRN

## 2017-04-10 MED ORDER — LIDOCAINE-EPINEPHRINE (PF) 1 %-1:200000 IJ SOLN
INTRAMUSCULAR | Status: AC
  Filled 2017-04-10: qty 30

## 2017-04-10 MED ORDER — DEXTROSE 5 % IV SOLN
1.5000 g | Freq: Two times a day (BID) | INTRAVENOUS | Status: DC
  Administered 2017-04-10: 1.5 g via INTRAVENOUS
  Filled 2017-04-10 (×3): qty 1.5

## 2017-04-10 MED ORDER — PHENYLEPHRINE 40 MCG/ML (10ML) SYRINGE FOR IV PUSH (FOR BLOOD PRESSURE SUPPORT)
PREFILLED_SYRINGE | INTRAVENOUS | Status: DC | PRN
  Administered 2017-04-10 (×2): 80 ug via INTRAVENOUS

## 2017-04-10 MED ORDER — SODIUM CHLORIDE 0.9 % IV SOLN
500.0000 mL | Freq: Once | INTRAVENOUS | Status: AC | PRN
  Administered 2017-04-10 (×2): 500 mL via INTRAVENOUS

## 2017-04-10 MED ORDER — PHENYLEPHRINE HCL 10 MG/ML IJ SOLN
INTRAVENOUS | Status: DC | PRN
  Administered 2017-04-10: 30 ug/min via INTRAVENOUS

## 2017-04-10 MED ORDER — ATORVASTATIN CALCIUM 20 MG PO TABS
20.0000 mg | ORAL_TABLET | Freq: Every day | ORAL | Status: DC
  Administered 2017-04-10: 20 mg via ORAL
  Filled 2017-04-10: qty 1

## 2017-04-10 MED ORDER — LIDOCAINE 2% (20 MG/ML) 5 ML SYRINGE
INTRAMUSCULAR | Status: AC
  Filled 2017-04-10: qty 5

## 2017-04-10 MED ORDER — CHLORHEXIDINE GLUCONATE CLOTH 2 % EX PADS
6.0000 | MEDICATED_PAD | Freq: Once | CUTANEOUS | Status: DC
Start: 2017-04-10 — End: 2017-04-10

## 2017-04-10 MED ORDER — SODIUM CHLORIDE 0.9 % IV SOLN: INTRAVENOUS | Status: DC

## 2017-04-10 MED ORDER — ASPIRIN 325 MG PO TABS
325.0000 mg | ORAL_TABLET | Freq: Every day | ORAL | Status: DC
  Administered 2017-04-10 – 2017-04-11 (×2): 325 mg via ORAL
  Filled 2017-04-10 (×2): qty 1

## 2017-04-10 MED ORDER — HYDROMORPHONE HCL 1 MG/ML IJ SOLN: 0.2500 mg | INTRAMUSCULAR | Status: DC | PRN

## 2017-04-10 MED ORDER — ACETAMINOPHEN 325 MG PO TABS
325.0000 mg | ORAL_TABLET | ORAL | Status: DC | PRN
  Administered 2017-04-10: 650 mg via ORAL
  Filled 2017-04-10: qty 2

## 2017-04-10 MED ORDER — HYDROCHLOROTHIAZIDE 12.5 MG PO CAPS
12.5000 mg | ORAL_CAPSULE | Freq: Every day | ORAL | Status: DC
  Filled 2017-04-10: qty 1

## 2017-04-10 MED ORDER — HEPARIN SODIUM (PORCINE) 1000 UNIT/ML IJ SOLN
INTRAMUSCULAR | Status: DC | PRN
  Administered 2017-04-10: 9000 [IU] via INTRAVENOUS

## 2017-04-10 MED ORDER — LACTATED RINGERS IV SOLN
INTRAVENOUS | Status: DC
  Administered 2017-04-10: 50 mL/h via INTRAVENOUS

## 2017-04-10 MED ORDER — DEXAMETHASONE SODIUM PHOSPHATE 10 MG/ML IJ SOLN
INTRAMUSCULAR | Status: AC
Start: 2017-04-10 — End: ?
  Filled 2017-04-10: qty 1

## 2017-04-10 MED ORDER — FENTANYL CITRATE (PF) 250 MCG/5ML IJ SOLN
INTRAMUSCULAR | Status: AC
  Filled 2017-04-10: qty 5

## 2017-04-10 MED ORDER — SUGAMMADEX SODIUM 200 MG/2ML IV SOLN
INTRAVENOUS | Status: DC | PRN
  Administered 2017-04-10: 200 mg via INTRAVENOUS

## 2017-04-10 MED ORDER — POTASSIUM CHLORIDE CRYS ER 20 MEQ PO TBCR: 20.0000 meq | EXTENDED_RELEASE_TABLET | Freq: Every day | ORAL | Status: DC | PRN

## 2017-04-10 MED ORDER — HYDRALAZINE HCL 20 MG/ML IJ SOLN: 5.0000 mg | INTRAMUSCULAR | Status: DC | PRN

## 2017-04-10 MED ORDER — SODIUM CHLORIDE 0.9 % IV SOLN
INTRAVENOUS | Status: DC | PRN
  Administered 2017-04-10: 500 mL

## 2017-04-10 MED ORDER — LORAZEPAM 1 MG PO TABS
1.0000 mg | ORAL_TABLET | Freq: Four times a day (QID) | ORAL | Status: DC | PRN
  Administered 2017-04-10: 1 mg via ORAL
  Filled 2017-04-10: qty 1

## 2017-04-10 MED ORDER — 0.9 % SODIUM CHLORIDE (POUR BTL) OPTIME
TOPICAL | Status: DC | PRN
  Administered 2017-04-10: 1000 mL

## 2017-04-10 MED ORDER — FENTANYL CITRATE (PF) 250 MCG/5ML IJ SOLN
INTRAMUSCULAR | Status: DC | PRN
  Administered 2017-04-10: 250 ug via INTRAVENOUS

## 2017-04-10 MED ORDER — LISINOPRIL 10 MG PO TABS
10.0000 mg | ORAL_TABLET | Freq: Every day | ORAL | Status: DC
  Filled 2017-04-10: qty 1

## 2017-04-10 SURGICAL SUPPLY — 45 items
ADH SKN CLS APL DERMABOND .7 (GAUZE/BANDAGES/DRESSINGS) ×1
ADH SKN CLS LQ APL DERMABOND (GAUZE/BANDAGES/DRESSINGS) ×1
BAG DECANTER FOR FLEXI CONT (MISCELLANEOUS) ×2 IMPLANT
CANISTER SUCT 3000ML PPV (MISCELLANEOUS) ×2 IMPLANT
CANNULA VESSEL 3MM 2 BLNT TIP (CANNULA) ×6 IMPLANT
CATH ROBINSON RED A/P 18FR (CATHETERS) ×2 IMPLANT
CLIP VESOCCLUDE MED 24/CT (CLIP) ×2 IMPLANT
CLIP VESOCCLUDE SM WIDE 24/CT (CLIP) ×2 IMPLANT
CRADLE DONUT ADULT HEAD (MISCELLANEOUS) ×2 IMPLANT
DERMABOND ADHESIVE PROPEN (GAUZE/BANDAGES/DRESSINGS) ×1
DERMABOND ADVANCED (GAUZE/BANDAGES/DRESSINGS) ×1
DERMABOND ADVANCED .7 DNX12 (GAUZE/BANDAGES/DRESSINGS) ×1 IMPLANT
DERMABOND ADVANCED .7 DNX6 (GAUZE/BANDAGES/DRESSINGS) IMPLANT
DRAIN CHANNEL 15F RND FF W/TCR (WOUND CARE) IMPLANT
ELECT REM PT RETURN 9FT ADLT (ELECTROSURGICAL) ×2
ELECTRODE REM PT RTRN 9FT ADLT (ELECTROSURGICAL) ×1 IMPLANT
EVACUATOR SILICONE 100CC (DRAIN) IMPLANT
GEL ULTRASOUND 20GR AQUASONIC (MISCELLANEOUS) ×2 IMPLANT
GLOVE BIO SURGEON STRL SZ7.5 (GLOVE) ×2 IMPLANT
GLOVE BIOGEL PI IND STRL 8 (GLOVE) ×1 IMPLANT
GLOVE BIOGEL PI INDICATOR 8 (GLOVE) ×1
GOWN STRL REUS W/ TWL LRG LVL3 (GOWN DISPOSABLE) ×3 IMPLANT
GOWN STRL REUS W/TWL LRG LVL3 (GOWN DISPOSABLE) ×6
KIT BASIN OR (CUSTOM PROCEDURE TRAY) ×2 IMPLANT
KIT ROOM TURNOVER OR (KITS) ×2 IMPLANT
KIT SHUNT ARGYLE CAROTID ART 6 (VASCULAR PRODUCTS) IMPLANT
NDL HYPO 25GX1X1/2 BEV (NEEDLE) ×1 IMPLANT
NEEDLE HYPO 25GX1X1/2 BEV (NEEDLE) ×2 IMPLANT
NS IRRIG 1000ML POUR BTL (IV SOLUTION) ×4 IMPLANT
PACK CAROTID (CUSTOM PROCEDURE TRAY) ×2 IMPLANT
PAD ARMBOARD 7.5X6 YLW CONV (MISCELLANEOUS) ×4 IMPLANT
PATCH VASC XENOSURE 1CMX6CM (Vascular Products) ×2 IMPLANT
PATCH VASC XENOSURE 1X6 (Vascular Products) IMPLANT
SHUNT CAROTID BYPASS 10 (VASCULAR PRODUCTS) ×1 IMPLANT
SHUNT CAROTID BYPASS 12 (VASCULAR PRODUCTS) IMPLANT
SPONGE SURGIFOAM ABS GEL 100 (HEMOSTASIS) IMPLANT
SUT PROLENE 6 0 BV (SUTURE) ×3 IMPLANT
SUT SILK 2 0 PERMA HAND 18 BK (SUTURE) IMPLANT
SUT VIC AB 3-0 SH 27 (SUTURE) ×2
SUT VIC AB 3-0 SH 27X BRD (SUTURE) ×1 IMPLANT
SUT VICRYL 4-0 PS2 18IN ABS (SUTURE) ×2 IMPLANT
SYR 20CC LL (SYRINGE) ×2 IMPLANT
SYR CONTROL 10ML LL (SYRINGE) ×2 IMPLANT
TOWEL GREEN STERILE (TOWEL DISPOSABLE) ×2 IMPLANT
WATER STERILE IRR 1000ML POUR (IV SOLUTION) ×2 IMPLANT

## 2017-04-10 NOTE — Anesthesia Procedure Notes (Signed)
Procedure Name: Intubation Date/Time: 04/10/2017 10:08 AM Performed by: Teressa Lower., CRNA Pre-anesthesia Checklist: Patient identified, Emergency Drugs available, Suction available and Patient being monitored Patient Re-evaluated:Patient Re-evaluated prior to induction Oxygen Delivery Method: Circle system utilized Preoxygenation: Pre-oxygenation with 100% oxygen Induction Type: IV induction Ventilation: Mask ventilation without difficulty Laryngoscope Size: Mac and 4 Grade View: Grade II Tube type: Oral Tube size: 7.5 mm Number of attempts: 1 Airway Equipment and Method: Stylet,  Oral airway and LTA kit utilized Placement Confirmation: ETT inserted through vocal cords under direct vision,  positive ETCO2 and breath sounds checked- equal and bilateral Secured at: 23 cm Tube secured with: Tape Dental Injury: Teeth and Oropharynx as per pre-operative assessment

## 2017-04-10 NOTE — Interval H&P Note (Signed)
History and Physical Interval Note:  04/10/2017 9:19 AM  Dean Alvarado  has presented today for surgery, with the diagnosis of left carotid stenosis  The various methods of treatment have been discussed with the patient and family. After consideration of risks, benefits and other options for treatment, the patient has consented to  Procedure(s): ENDARTERECTOMY CAROTID (Left) as a surgical intervention .  The patient's history has been reviewed, patient examined, no change in status, stable for surgery.  I have reviewed the patient's chart and labs.  Questions were answered to the patient's satisfaction.     Deitra Mayo

## 2017-04-10 NOTE — Transfer of Care (Signed)
Immediate Anesthesia Transfer of Care Note  Patient: Dean Alvarado  Procedure(s) Performed: LEFT CAROTID ENDARTERECTOMY (Left Neck) PATCH ANGIOPLASTY of left carotid artery using xenosure biologic patch (Left Neck)  Patient Location: PACU  Anesthesia Type:General  Level of Consciousness: awake, alert  and oriented  Airway & Oxygen Therapy: Patient Spontanous Breathing and Patient connected to nasal cannula oxygen  Post-op Assessment: Report given to RN and Post -op Vital signs reviewed and stable  Post vital signs: Reviewed and stable  Last Vitals:  Vitals:   04/10/17 0716  BP: (!) 140/97  Pulse: 72  Resp: 18  Temp: 36.6 C  SpO2: 98%    Last Pain:  Vitals:   04/10/17 0731  TempSrc:   PainSc: 0-No pain      Patients Stated Pain Goal: 3 (61/22/44 9753)  Complications: No apparent anesthesia complications

## 2017-04-10 NOTE — Anesthesia Postprocedure Evaluation (Signed)
Anesthesia Post Note  Patient: Dean Alvarado  Procedure(s) Performed: LEFT CAROTID ENDARTERECTOMY (Left Neck) PATCH ANGIOPLASTY of left carotid artery using xenosure biologic patch (Left Neck)     Patient location during evaluation: PACU Anesthesia Type: General Level of consciousness: awake and alert Pain management: pain level controlled Vital Signs Assessment: post-procedure vital signs reviewed and stable Respiratory status: spontaneous breathing, nonlabored ventilation, respiratory function stable and patient connected to nasal cannula oxygen Cardiovascular status: blood pressure returned to baseline and stable Postop Assessment: no apparent nausea or vomiting Anesthetic complications: no    Last Vitals:  Vitals:   04/10/17 1630 04/10/17 1656  BP: 91/63 95/60  Pulse: 70 96  Resp: 17   Temp:  37.1 C  SpO2: 94% 94%    Last Pain:  Vitals:   04/10/17 1656  TempSrc: Oral  PainSc:                  Shanasia Ibrahim DAVID

## 2017-04-10 NOTE — Progress Notes (Signed)
   VASCULAR SURGERY POSTOP NOTE:   Stable postop.  Blood pressure is a little bit low and so we will start dopamine.  He has received 2 boluses of fluid.  SUBJECTIVE:   No complaints except the a line bothers him some.  PHYSICAL EXAM:   Vitals:   04/10/17 1225 04/10/17 1240 04/10/17 1255 04/10/17 1310  BP: (!) 100/59 94/66 91/63  (!) 87/60  Pulse: 80 64 63 65  Resp: 13 13 14 15   Temp:      TempSrc:      SpO2: 96% 96% 94% 96%  Weight:      Height:       Neuro intact. Left neck incision looks fine.  LABS:   Lab Results  Component Value Date   INR 1.08 04/10/2017   CBG (last 3)  Recent Labs    04/07/17 2051  GLUCAP 114*    PROBLEM LIST:    Active Problems:   Symptomatic carotid artery stenosis   CURRENT MEDS:   . Chlorhexidine Gluconate Cloth  6 each Topical Once   And  . Chlorhexidine Gluconate Cloth  6 each Topical Once    Deitra Mayo Beeper: [phone] Office: [phone] 04/10/2017

## 2017-04-10 NOTE — Anesthesia Preprocedure Evaluation (Signed)
Anesthesia Evaluation  Patient identified by MRN, date of birth, ID band Patient awake    Reviewed: Allergy & Precautions, NPO status , Patient's Chart, lab work & pertinent test results  Airway Mallampati: I  TM Distance: >3 FB Neck ROM: Full    Dental   Pulmonary Current Smoker,    Pulmonary exam normal        Cardiovascular hypertension, Pt. on medications + Peripheral Vascular Disease  Normal cardiovascular exam     Neuro/Psych TIA   GI/Hepatic   Endo/Other    Renal/GU      Musculoskeletal   Abdominal   Peds  Hematology   Anesthesia Other Findings   Reproductive/Obstetrics                             Anesthesia Physical Anesthesia Plan  ASA: III  Anesthesia Plan: General   Post-op Pain Management:    Induction: Intravenous  PONV Risk Score and Plan: 1 and Dexamethasone, Ondansetron and Midazolam  Airway Management Planned: Oral ETT  Additional Equipment: Arterial line  Intra-op Plan:   Post-operative Plan: Extubation in OR  Informed Consent: I have reviewed the patients History and Physical, chart, labs and discussed the procedure including the risks, benefits and alternatives for the proposed anesthesia with the patient or authorized representative who has indicated his/her understanding and acceptance.     Plan Discussed with: CRNA and Surgeon  Anesthesia Plan Comments:         Anesthesia Quick Evaluation

## 2017-04-10 NOTE — Anesthesia Procedure Notes (Signed)
Arterial Line Insertion Start/End12/20/2018 9:00 AM, 04/10/2017 9:15 AM Performed by: Teressa Lower., CRNA, CRNA  Patient location: Pre-op. Preanesthetic checklist: patient identified, IV checked, site marked, risks and benefits discussed, surgical consent, monitors and equipment checked, pre-op evaluation, timeout performed and anesthesia consent Lidocaine 1% used for infiltration Right, radial was placed Catheter size: 20 G Hand hygiene performed , maximum sterile barriers used  and Seldinger technique used Allen's test indicative of satisfactory collateral circulation Attempts: 1 Procedure performed without using ultrasound guided technique. Following insertion, dressing applied and Biopatch. Post procedure assessment: normal and unchanged  Patient tolerated the procedure well with no immediate complications.

## 2017-04-10 NOTE — Op Note (Signed)
    NAME: Dean Alvarado    MRN: [id number] DOB: [birth year]    DATE OF OPERATION: 04/10/2017  PREOP DIAGNOSIS:    Symptomatic left carotid stenosis  POSTOP DIAGNOSIS:    Same  PROCEDURE:    Left carotid endarterectomy with bovine pericardial patch angioplasty  SURGEON: Judeth Cornfield. Scot Dock, MD, FACS  ASSIST: Arlee Muslim PA  ANESTHESIA: General  EBL: Minimal  INDICATIONS:    Dean Alvarado is a 61 y.o. male who had an episode of amaurosis fugax in the left eye.  He was found to have a tight left carotid stenosis and left carotid endarterectomy was recommended in order to lower his risk of future stroke.  Of note, he had no significant carotid stenosis on the right.  FINDINGS:   90% left carotid stenosis which extended fairly high up the internal carotid artery.  TECHNIQUE:   The patient was taken to the operating room and received a general anesthetic.  An arterial line had been placed by anesthesia.  The left neck was prepped and draped in the usual sterile fashion.  An incision was made along the anterior border of the sternocleidomastoid and the dissection carried down to the common carotid artery which was dissected free and controlled with Rummel tourniquet.  The facial vein was divided between 2-0 silk ties.  The common carotid artery was dissected free and controlled with a Rummel tourniquet.  Patient had been heparinized.  The superior thyroid artery and external carotid arteries were controlled.  The internal carotid artery was controlled above the plaque.  Clamps were then placed on the internal and the common and the external carotid artery.  A longitudinal arteriotomy was made in the common carotid artery.  This was extended through the plaque into the normal internal carotid artery.  Distally there is a nice tapering the plaque.  I did place 2 tacking sutures in the posterior wall.  The artery was irrigated with copious amounts of heparin and dextran and all loose  debris removed.  The bovine pericardial patch was then sewn using continuous 6-0 Prolene suture.  Prior to completing the patch closure, the shunt was removed.  The arteries were backbled and flushed appropriately and the anastomosis completed.  Flow was reestablished first to the external carotid artery and into the internal carotid artery.  At the completion was an excellent pulse distal to the patch and a good Doppler signal with diastolic flow.  The heparin was partially reversed with protamine.  Hemostasis was obtained in the wound.  The wound was closed with a deep layer of 3-0 Vicryl.  The platysma was closed with running 3-0 Vicryl.  The skin was closed with 4-0 Vicryl.  Sterile dressing was applied.  The patient tolerated the procedure well and was transferred to the recovery room in stable condition.  All needle and sponge counts were correct.  Deitra Mayo, MD, FACS [facility]  DATE OF DICTATION:   04/10/2017

## 2017-04-11 ENCOUNTER — Encounter (HOSPITAL_COMMUNITY): Payer: Self-pay | Admitting: Vascular Surgery

## 2017-04-11 ENCOUNTER — Telehealth: Payer: Self-pay | Admitting: Vascular Surgery

## 2017-04-11 DIAGNOSIS — I10 Essential (primary) hypertension: Secondary | ICD-10-CM | POA: Diagnosis not present

## 2017-04-11 DIAGNOSIS — F17200 Nicotine dependence, unspecified, uncomplicated: Secondary | ICD-10-CM | POA: Diagnosis not present

## 2017-04-11 DIAGNOSIS — E785 Hyperlipidemia, unspecified: Secondary | ICD-10-CM | POA: Diagnosis not present

## 2017-04-11 DIAGNOSIS — F419 Anxiety disorder, unspecified: Secondary | ICD-10-CM | POA: Diagnosis not present

## 2017-04-11 DIAGNOSIS — Z8673 Personal history of transient ischemic attack (TIA), and cerebral infarction without residual deficits: Secondary | ICD-10-CM | POA: Diagnosis not present

## 2017-04-11 DIAGNOSIS — Z79899 Other long term (current) drug therapy: Secondary | ICD-10-CM | POA: Diagnosis not present

## 2017-04-11 DIAGNOSIS — I739 Peripheral vascular disease, unspecified: Secondary | ICD-10-CM | POA: Diagnosis not present

## 2017-04-11 DIAGNOSIS — G453 Amaurosis fugax: Secondary | ICD-10-CM | POA: Diagnosis not present

## 2017-04-11 DIAGNOSIS — I6522 Occlusion and stenosis of left carotid artery: Secondary | ICD-10-CM | POA: Diagnosis not present

## 2017-04-11 DIAGNOSIS — Z7982 Long term (current) use of aspirin: Secondary | ICD-10-CM | POA: Diagnosis not present

## 2017-04-11 LAB — BASIC METABOLIC PANEL
Anion gap: 7 (ref 5–15)
BUN: 16 mg/dL (ref 6–20)
CO2: 19 mmol/L — ABNORMAL LOW (ref 22–32)
Calcium: 8.1 mg/dL — ABNORMAL LOW (ref 8.9–10.3)
Chloride: 111 mmol/L (ref 101–111)
Creatinine, Ser: 0.75 mg/dL (ref 0.61–1.24)
GFR calc Af Amer: >60 mL/min (ref >60)
GFR calc non Af Amer: >60 mL/min (ref >60)
Glucose, Bld: 124 mg/dL — ABNORMAL HIGH (ref 65–99)
Potassium: 3.8 mmol/L (ref 3.5–5.1)
Sodium: 137 mmol/L (ref 135–145)

## 2017-04-11 LAB — CBC
HCT: 34.6 % — ABNORMAL LOW (ref 39.0–52.0)
Hemoglobin: 12.1 g/dL — ABNORMAL LOW (ref 13.0–17.0)
MCH: 32.4 pg (ref 26.0–34.0)
MCHC: 35 g/dL (ref 30.0–36.0)
MCV: 92.8 fL (ref 78.0–100.0)
Platelets: 228 10*3/uL (ref 150–400)
RBC: 3.73 MIL/uL — ABNORMAL LOW (ref 4.22–5.81)
RDW: 13.2 % (ref 11.5–15.5)
WBC: 13.8 10*3/uL — ABNORMAL HIGH (ref 4.0–10.5)

## 2017-04-11 MED ORDER — OXYCODONE-ACETAMINOPHEN 5-325 MG PO TABS: 1.0000 | ORAL_TABLET | Freq: Four times a day (QID) | ORAL | 0 refills | Status: DC | PRN

## 2017-04-11 NOTE — Discharge Instructions (Signed)
   [facility] [place]  Discharge Instructions   Carotid Endarterectomy (CEA)  Please refer to the following instructions for your post-procedure care. Your surgeon or physician assistant will discuss any changes with you.  Activity  You are encouraged to walk as much as you can. You can slowly return to normal activities but must avoid strenuous activity and heavy lifting until your doctor tell you it's OK. Avoid activities such as vacuuming or swinging a golf club. You can drive after one week if you are comfortable and you are no longer taking prescription pain medications. It is normal to feel tired for serval weeks after your surgery. It is also normal to have difficulty with sleep habits, eating, and bowel movements after surgery. These will go away with time.  Bathing/Showering  You may shower after you come home. Do not soak in a bathtub, hot tub, or swim until the incision heals completely.  Incision Care  Shower every day. Clean your incision with mild soap and water. Pat the area dry with a clean towel. You do not need a bandage unless otherwise instructed. Do not apply any ointments or creams to your incision. You may have skin glue on your incision. Do not peel it off. It will come off on its own in about one week. Your incision may feel thickened and raised for several weeks after your surgery. This is normal and the skin will soften over time. For Men Only: It's OK to shave around the incision but do not shave the incision itself for 2 weeks. It is common to have numbness under your chin that could last for several months.  Diet  Resume your normal diet. There are no special food restrictions following this procedure. A low fat/low cholesterol diet is recommended for all patients with vascular disease. In order to heal from your surgery, it is CRITICAL to get adequate nutrition. Your body requires vitamins, minerals, and protein. Vegetables are the best  source of vitamins and minerals. Vegetables also provide the perfect balance of protein. Processed food has little nutritional value, so try to avoid this.        Medications  Resume taking all of your medications unless your doctor or physician assistant tells you not to. If your incision is causing pain, you may take over-the- counter pain relievers such as acetaminophen (Tylenol). If you were prescribed a stronger pain medication, please be aware these medications can cause nausea and constipation. Prevent nausea by taking the medication with a snack or meal. Avoid constipation by drinking plenty of fluids and eating foods with a high amount of fiber, such as fruits, vegetables, and grains. Do not take Tylenol if you are taking prescription pain medications.  Follow Up  Our office will schedule a follow up appointment 2-3 weeks following discharge.  Please call us immediately for any of the following conditions  Increased pain, redness, drainage (pus) from your incision site. Fever of 101 degrees or higher. If you should develop stroke (slurred speech, difficulty swallowing, weakness on one side of your body, loss of vision) you should call 911 and go to the nearest emergency room.  Reduce your risk of vascular disease:  Stop smoking. If you would like help call [facility] at 1-800-QUIT-NOW (1-800-784-8669) or [place] at 336-586-4000. Manage your cholesterol Maintain a desired weight Control your diabetes Keep your blood pressure down  If you have any questions, please call the office at [phone].   

## 2017-04-11 NOTE — Discharge Summary (Signed)
Discharge Summary     Dean Alvarado 1955/07/17 61 y.o. male  [id number]  Admission Date: 04/10/2017  Discharge Date: 04/11/17  Physician: Angelia Mould, *  Admission Diagnosis: left carotid stenosis  Discharge Day services:    See progress note 12/21 Physical Exam: Vitals:   04/11/17 0535 04/11/17 0605  BP: 116/75 109/76  Pulse: 84 85  Resp: (!) 22 16  Temp:  98.7 F (37.1 C)  SpO2: 94% 96%    Hospital Course:  The patient was admitted to the hospital and taken to the operating room on 04/10/2017 and underwent left carotid endarterectomy.  The pt tolerated the procedure well and was transported to the PACU in fair condition.   By POD 1, the pt neuro status is intact with no apparent deficit  The remainder of the hospital course consisted of increasing mobilization and increasing intake of solids without difficulty.  He will be prescribed #8 Percocet 5/325mg  for continued post operative pain control.  He will follow up with Dr. Scot Dock in about 2 weeks.  Discharge instructions were reviewed with the patient and he voices his understanding.  He will be discharged home this morning in stable condition.   Recent Labs    04/10/17 0722 04/11/17 0326  NA 139 137  K 3.9 3.8  CL 110 111  CO2 22 19*  GLUCOSE 129* 124*  BUN 16 16  CALCIUM 8.6* 8.1*   Recent Labs    04/10/17 0722 04/11/17 0326  WBC 7.3 13.8*  HGB 14.8 12.1*  HCT 42.5 34.6*  PLT 265 228   Recent Labs    04/10/17 0722  INR 1.08       Discharge Diagnosis:  left carotid stenosis  Secondary Diagnosis: Patient Active Problem List   Diagnosis Date Noted  . Symptomatic carotid artery stenosis 04/10/2017  . Stenosis of left internal carotid artery   . Transient visual loss of left eye   . Amaurosis fugax of left eye   . TIA (transient ischemic attack) 04/07/2017  . Hyperlipidemia 04/27/2014  . HTN (hypertension) 04/27/2014  . Anxiety state 04/27/2014  . Hyperglycemia  03/23/2013  . Cerumen impaction 11/18/2011  . ALLERGIC RHINITIS 11/18/2006   Past Medical History:  Diagnosis Date  . Carotid stenosis, left 04/08/2017  . Hay fever    "1-2 months/year" (04/10/2017)  . Hyperlipidemia   . Hypertension   . Peripheral vascular disease ([place])   . TIA (transient ischemic attack) 04/08/2017   Archie Endo 04/08/2017    Allergies as of 04/11/2017   No Known Allergies     Medication List    TAKE these medications   aspirin 325 MG tablet Take 1 tablet (325 mg total) by mouth daily.   atorvastatin 20 MG tablet Commonly known as:  LIPITOR TAKE 1 TABLET BY MOUTH EVERY DAY What changed:    how much to take  how to take this  when to take this   ibuprofen 200 MG tablet Commonly known as:  ADVIL,MOTRIN Take 200 mg by mouth daily.   lisinopril-hydrochlorothiazide 10-12.5 MG tablet Commonly known as:  PRINZIDE,ZESTORETIC Take 1 tablet by mouth daily. Check blood pressure at home and if systolic (top number less than 160) do not take until after surgery on 04/10/17.   loratadine 10 MG tablet Commonly known as:  CLARITIN Take 10 mg by mouth daily as needed for allergies.   LORazepam 1 MG tablet Commonly known as:  ATIVAN TAKE 1 TABLET EVERY 6 HOURS AS NEEDED What changed:  how much to take  how to take this  when to take this   oxyCODONE-acetaminophen 5-325 MG tablet Commonly known as:  PERCOCET/ROXICET Take 1 tablet by mouth every 6 (six) hours as needed for moderate pain.        Discharge Instructions:   [facility] Discharge Instructions Carotid Endarterectomy (CEA)  Please refer to the following instructions for your post-procedure care. Your surgeon or physician assistant will discuss any changes with you.  Activity  You are encouraged to walk as much as you can. You can slowly return to normal activities but must avoid strenuous activity and heavy lifting until your doctor tell you it's OK.  Avoid activities such as vacuuming or swinging a golf club. You can drive after one week if you are comfortable and you are no longer taking prescription pain medications. It is normal to feel tired for serval weeks after your surgery. It is also normal to have difficulty with sleep habits, eating, and bowel movements after surgery. These will go away with time.  Bathing/Showering  You may shower after you come home. Do not soak in a bathtub, hot tub, or swim until the incision heals completely.  Incision Care  Shower every day. Clean your incision with mild soap and water. Pat the area dry with a clean towel. You do not need a bandage unless otherwise instructed. Do not apply any ointments or creams to your incision. You may have skin glue on your incision. Do not peel it off. It will come off on its own in about one week. Your incision may feel thickened and raised for several weeks after your surgery. This is normal and the skin will soften over time. For Men Only: It's OK to shave around the incision but do not shave the incision itself for 2 weeks. It is common to have numbness under your chin that could last for several months.  Diet  Resume your normal diet. There are no special food restrictions following this procedure. A low fat/low cholesterol diet is recommended for all patients with vascular disease. In order to heal from your surgery, it is CRITICAL to get adequate nutrition. Your body requires vitamins, minerals, and protein. Vegetables are the best source of vitamins and minerals. Vegetables also provide the perfect balance of protein. Processed food has little nutritional value, so try to avoid this.  Medications  Resume taking all of your medications unless your doctor or physician assistant tells you not to.  If your incision is causing pain, you may take over-the- counter pain relievers such as acetaminophen (Tylenol). If you were prescribed a stronger pain medication, please be  aware these medications can cause nausea and constipation.  Prevent nausea by taking the medication with a snack or meal. Avoid constipation by drinking plenty of fluids and eating foods with a high amount of fiber, such as fruits, vegetables, and grains. Do not take Tylenol if you are taking prescription pain medications.  Follow Up  Our office will schedule a follow up appointment 2-3 weeks following discharge.  Please call us immediately for any of the following conditions  Increased pain, redness, drainage (pus) from your incision site. Fever of 101 degrees or higher. If you should develop stroke (slurred speech, difficulty swallowing, weakness on one side of your body, loss of vision) you should call 911 and go to the nearest emergency room.  Reduce your risk of vascular disease:  Stop smoking. If you would like help call [facility]  at 1-800-QUIT-NOW 352-346-4652) or [place] at [phone]. Manage your cholesterol Maintain a desired weight Control your diabetes Keep your blood pressure down  If you have any questions, please call the office at [phone].  Prescriptions given: Percocet 5/325mg  #8 No Refill  Disposition: home  Patient's condition: is Good  Follow up: 1. Dr. Scot Dock in 2 weeks.   Dagoberto Ligas, PA-C [facility] [phone]   --- For [facility] use ---  IV medication needed for:  1. Hypertension: No 2. Hypotension: Yes  Post-op Complications: No  1. Post-op CVA or TIA: No   2. CN injury: No    3. Myocardial infarction: No   4.  CHF: No  5.  Dysrhythmia (new): No  6. Wound infection: No  7. Reperfusion symptoms: No  8. Return to OR: No   Discharge medications: Statin use:  Yes ASA use:  Yes   Beta blocker use:  Yes ACE-Inhibitor use:  Yes  ARB use:  No CCB use: No P2Y12 Antagonist use: No, [ ]  Plavix, [ ]  Plasugrel, [ ]  Ticlopinine, [ ]  Ticagrelor, [ ]  Other, [ ]  No for medical reason, [ ]   Non-compliant, [ ]  Not-indicated Anti-coagulant use:  No, [ ]  Warfarin, [ ]  Rivaroxaban, [ ]  Dabigatran,

## 2017-04-11 NOTE — Telephone Encounter (Signed)
-----   Message from Mena Goes, RN sent at 04/11/2017  8:26 AM EST ----- Regarding: 2 weeks post op CEA   ----- Message ----- From: Iline Oven Sent: 04/11/2017   7:22 AM To: [facility] Charge Pool  Can you schedule an appt for this pt in about 2 weeks.  PO L CEA by Dr. Scot Dock. Thanks, [facility]

## 2017-04-11 NOTE — Progress Notes (Signed)
   VASCULAR SURGERY ASSESSMENT & PLAN:   1 Day Post-Op s/p: Left carotid endarterectomy  Doing well.  Blood pressure under good control.  Home today.  Continue aspirin and statin.  We have also discussed the importance of exercise and nutrition.   SUBJECTIVE:   No specific complaints.  PHYSICAL EXAM:   Vitals:   04/11/17 0342 04/11/17 0414 04/11/17 0535 04/11/17 0605  BP: 92/73 109/71 116/75 109/76  Pulse: 79 81 84 85  Resp: 15 (!) 24 (!) 22 16  Temp: 98.2 F (36.8 C)   98.7 F (37.1 C)  TempSrc: Oral   Oral  SpO2: 94% 95% 94% 96%  Weight:      Height:       Left neck incision looks fine. NEURO: No focal weakness or paresthesias.  LABS:   Lab Results  Component Value Date   WBC 13.8 (H) 04/11/2017   HGB 12.1 (L) 04/11/2017   HCT 34.6 (L) 04/11/2017   MCV 92.8 04/11/2017   PLT 228 04/11/2017   Lab Results  Component Value Date   CREATININE 0.75 04/11/2017   Lab Results  Component Value Date   INR 1.08 04/10/2017    PROBLEM LIST:    Active Problems:   Symptomatic carotid artery stenosis   CURRENT MEDS:   . aspirin  325 mg Oral Daily  . atorvastatin  20 mg Oral q1800  . docusate sodium  100 mg Oral Daily  . lisinopril  10 mg Oral Daily   And  . hydrochlorothiazide  12.5 mg Oral Daily  . pantoprazole  40 mg Oral Daily    Deitra Mayo Beeper: [phone] Office: [phone] 04/11/2017

## 2017-04-11 NOTE — Progress Notes (Signed)
A-line D/C. BP and HR stable. Tolerated well. No S/S of Afib. Currently in NSR. Incision site clean, no redness or swelling noted. Patient very happy with his progress.

## 2017-04-11 NOTE — Telephone Encounter (Signed)
Sched appt 05/07/17 at 3:30. Spoke to pt.

## 2017-04-11 NOTE — Progress Notes (Signed)
04/11/2017 1030 Discharge AVS meds taken today and those due this evening reviewed.  Follow-up appointments and when to call md reviewed.  D/C IV and TELE.  Questions and concerns addressed.   D/C home per orders. [place]

## 2017-04-17 ENCOUNTER — Telehealth: Payer: Self-pay | Admitting: Family Medicine

## 2017-04-17 NOTE — Telephone Encounter (Signed)
Copied from [place] [phone]. Topic: General - Other >> Apr 17, 2017  2:29 PM Scherrie Gerlach wrote: Reason for CRM: To Dr Sarajane Jews:: Judeen Hammans, case manager with [employer] states pt has agreed to work with her. Goals to quit smoking, loose weight, PPVD. (pt just dc'd from the hospital for this) Will work with pt about 3 months. This is just a courtesy call.  If you have any questions, request, you can call her back on the number listed which is her direct line.  Nothing further needed.

## 2017-04-18 NOTE — Telephone Encounter (Signed)
Sent to PCP as an FYI  

## 2017-05-07 ENCOUNTER — Encounter: Payer: Self-pay | Admitting: Vascular Surgery

## 2017-05-07 ENCOUNTER — Ambulatory Visit (INDEPENDENT_AMBULATORY_CARE_PROVIDER_SITE_OTHER): Payer: BLUE CROSS/BLUE SHIELD | Admitting: Vascular Surgery

## 2017-05-07 VITALS — BP 120/84 | HR 101 | Temp 98.4°F | Resp 20 | Ht 68.0 in | Wt 205.3 lb

## 2017-05-07 DIAGNOSIS — Z48812 Encounter for surgical aftercare following surgery on the circulatory system: Secondary | ICD-10-CM

## 2017-05-07 DIAGNOSIS — G459 Transient cerebral ischemic attack, unspecified: Secondary | ICD-10-CM

## 2017-05-07 DIAGNOSIS — I6522 Occlusion and stenosis of left carotid artery: Secondary | ICD-10-CM

## 2017-05-07 NOTE — Progress Notes (Signed)
Patient name: Dean Alvarado MRN: [id number] DOB: 1955-08-03 Sex: male  REASON FOR VISIT:   Follow-up after left carotid endarterectomy.  HPI:   Dean Alvarado is a pleasant 62 y.o. male who had an episode of amaurosis fugax in the left eye.  He was found to have a tight left carotid stenosis.  Of note he had no significant carotid stenosis on the right.  He underwent left carotid endarterectomy with bovine pericardial patch angioplasty on 04/10/2017.  He comes for his first postoperative visit.    He has no specific complaints.  He denies weakness paresthesias in his extremities.  He had quit smoking the day before surgery but unfortunately has been under a lot of stress at work and just yesterday started smoking again.  He is on aspirin and is on a statin.  Current Outpatient Medications  Medication Sig Dispense Refill  . aspirin 325 MG tablet Take 1 tablet (325 mg total) by mouth daily. 30 tablet 0  . atorvastatin (LIPITOR) 20 MG tablet TAKE 1 TABLET BY MOUTH EVERY DAY (Patient taking differently: TAKE 1 TABLET (20mg )BY MOUTH EVERY DAY) 90 tablet 3  . ibuprofen (ADVIL,MOTRIN) 200 MG tablet Take 200 mg by mouth daily.    Marland Kitchen lisinopril-hydrochlorothiazide (PRINZIDE,ZESTORETIC) 10-12.5 MG tablet Take 1 tablet by mouth daily. Check blood pressure at home and if systolic (top number less than 160) do not take until after surgery on 04/10/17.    Marland Kitchen loratadine (CLARITIN) 10 MG tablet Take 10 mg by mouth daily as needed for allergies.    Marland Kitchen LORazepam (ATIVAN) 1 MG tablet TAKE 1 TABLET EVERY 6 HOURS AS NEEDED (Patient taking differently: Take 0.5 - 1 mg by mouth twice daily as needed) 60 tablet 5  . oxyCODONE-acetaminophen (PERCOCET/ROXICET) 5-325 MG tablet Take 1 tablet by mouth every 6 (six) hours as needed for moderate pain. (Patient not taking: Reported on 05/07/2017) 8 tablet 0   No current facility-administered medications for this visit.     REVIEW OF SYSTEMS:  [X]  denotes positive  finding, [ ]  denotes negative finding Cardiac  Comments:  Chest pain or chest pressure:    Shortness of breath upon exertion:    Short of breath when lying flat:    Irregular heart rhythm:    Constitutional    Fever or chills:     PHYSICAL EXAM:   Vitals:   05/07/17 1534 05/07/17 1535  BP: 131/89 120/84  Pulse: (!) 101   Resp: 20   Temp: 98.4 F (36.9 C)   TempSrc: Oral   Weight: 205 lb 4.8 oz (93.1 kg)   Height: 5\' 8"  (1.727 m)     GENERAL: The patient is a well-nourished male, in no acute distress. The vital signs are documented above. CARDIOVASCULAR: There is a regular rate and rhythm. PULMONARY: There is good air exchange bilaterally without wheezing or rales. His left neck incision is healing nicely. He has no focal weakness or paresthesias.  DATA:   No new data.  MEDICAL ISSUES:   STATUS POST LEFT CAROTID ENDARTERECTOMY: The patient is doing well status post left carotid endarterectomy for asymptomatic left carotid stenosis.  He has no significant stenosis on the right.  We have again discussed the importance of tobacco cessation.  He is on aspirin and is on a statin.  I have ordered a follow-up carotid duplex scan in 6 months and I will see him back at that time.  He is to call sooner if he has problems.  Harrell Gave  [facility] [employer] [phone]

## 2017-05-13 NOTE — Addendum Note (Signed)
Addended by: Lianne Cure A on: 05/13/2017 11:13 AM   Modules accepted: Orders

## 2017-05-19 ENCOUNTER — Ambulatory Visit: Payer: BLUE CROSS/BLUE SHIELD | Admitting: Family Medicine

## 2017-05-19 ENCOUNTER — Encounter: Payer: Self-pay | Admitting: Family Medicine

## 2017-05-19 VITALS — BP 140/90 | HR 97 | Temp 98.3°F | Wt 209.0 lb

## 2017-05-19 DIAGNOSIS — Z23 Encounter for immunization: Secondary | ICD-10-CM

## 2017-05-19 DIAGNOSIS — F411 Generalized anxiety disorder: Secondary | ICD-10-CM | POA: Diagnosis not present

## 2017-05-19 DIAGNOSIS — I6522 Occlusion and stenosis of left carotid artery: Secondary | ICD-10-CM | POA: Diagnosis not present

## 2017-05-19 MED ORDER — LORAZEPAM 1 MG PO TABS: 1.0000 mg | ORAL_TABLET | Freq: Four times a day (QID) | ORAL | 5 refills | Status: DC | PRN

## 2017-05-19 NOTE — Progress Notes (Signed)
   Subjective:    Patient ID: Dean Alvarado, male    DOB: 1956/04/20, 62 y.o.   MRN: [id number]  HPI Here to discuss several issues. He presented last month to the ER with amaurosis fugax and ended up getting a left carotid endarterectomy per Dr. Scot Dock. The preop doppplers revealed a left ICA stenosis of 80-99% and and a right ICA of 1-39%. This went well and he has had no further neurologic symptoms. He has been dealing with a lot of anxiety lately, including at his job. There has been a lot of turmoil in his office and he plans to resign his position in the near future. He wants to increase the frequency of Ativan dosing. Also he has restarted smoking and he plans to quit again using nicotine gum like before.    Review of Systems  Constitutional: Negative.   Respiratory: Negative.   Cardiovascular: Negative.   Neurological: Negative.   Psychiatric/Behavioral: Positive for sleep disturbance. Negative for confusion, decreased concentration, dysphoric mood and hallucinations. The patient is nervous/anxious.        Objective:   Physical Exam  Constitutional: He is oriented to person, place, and time. He appears well-developed and well-nourished.  Cardiovascular: Normal rate, regular rhythm, normal heart sounds and intact distal pulses.  Pulmonary/Chest: Effort normal and breath sounds normal. No respiratory distress. He has no wheezes. He has no rales.  Neurological: He is alert and oriented to person, place, and time.  Psychiatric: He has a normal mood and affect. His behavior is normal. Judgment and thought content normal.          Assessment & Plan:  He is recovering from recent carotid surgery. He is dealing with a lot of anxiety so we will increase the Ativan 1 mg to TID dosing.  Alysia Penna, MD

## 2017-05-26 DIAGNOSIS — M549 Dorsalgia, unspecified: Secondary | ICD-10-CM | POA: Diagnosis not present

## 2017-05-26 DIAGNOSIS — W19XXXA Unspecified fall, initial encounter: Secondary | ICD-10-CM | POA: Diagnosis not present

## 2017-05-26 DIAGNOSIS — Y92009 Unspecified place in unspecified non-institutional (private) residence as the place of occurrence of the external cause: Secondary | ICD-10-CM | POA: Diagnosis not present

## 2017-05-29 ENCOUNTER — Encounter (INDEPENDENT_AMBULATORY_CARE_PROVIDER_SITE_OTHER): Payer: Self-pay | Admitting: Surgery

## 2017-05-29 ENCOUNTER — Ambulatory Visit (INDEPENDENT_AMBULATORY_CARE_PROVIDER_SITE_OTHER): Payer: BLUE CROSS/BLUE SHIELD | Admitting: Surgery

## 2017-05-29 ENCOUNTER — Ambulatory Visit (INDEPENDENT_AMBULATORY_CARE_PROVIDER_SITE_OTHER): Payer: Self-pay

## 2017-05-29 DIAGNOSIS — G8929 Other chronic pain: Secondary | ICD-10-CM

## 2017-05-29 DIAGNOSIS — M545 Low back pain: Secondary | ICD-10-CM

## 2017-05-29 DIAGNOSIS — S300XXA Contusion of lower back and pelvis, initial encounter: Secondary | ICD-10-CM

## 2017-05-29 MED ORDER — HYDROCODONE-ACETAMINOPHEN 5-325 MG PO TABS: 1.0000 | ORAL_TABLET | Freq: Three times a day (TID) | ORAL | 0 refills | Status: DC | PRN

## 2017-05-29 MED ORDER — METHOCARBAMOL 500 MG PO TABS: 500.0000 mg | ORAL_TABLET | Freq: Three times a day (TID) | ORAL | 0 refills | Status: DC | PRN
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
## 2017-06-05 ENCOUNTER — Ambulatory Visit (INDEPENDENT_AMBULATORY_CARE_PROVIDER_SITE_OTHER): Payer: BLUE CROSS/BLUE SHIELD | Admitting: Surgery

## 2017-06-19 ENCOUNTER — Encounter (INDEPENDENT_AMBULATORY_CARE_PROVIDER_SITE_OTHER): Payer: Self-pay | Admitting: Surgery

## 2017-06-19 ENCOUNTER — Ambulatory Visit (INDEPENDENT_AMBULATORY_CARE_PROVIDER_SITE_OTHER): Payer: BLUE CROSS/BLUE SHIELD | Admitting: Surgery

## 2017-06-19 VITALS — BP 145/83 | HR 93 | Ht 68.0 in | Wt 206.0 lb

## 2017-06-19 DIAGNOSIS — S300XXA Contusion of lower back and pelvis, initial encounter: Secondary | ICD-10-CM

## 2017-06-19 MED ORDER — METHOCARBAMOL 500 MG PO TABS: 500.0000 mg | ORAL_TABLET | Freq: Three times a day (TID) | ORAL | 0 refills | Status: DC | PRN

## 2017-06-19 MED ORDER — HYDROCODONE-ACETAMINOPHEN 5-325 MG PO TABS: 1.0000 | ORAL_TABLET | Freq: Two times a day (BID) | ORAL | 0 refills | Status: DC | PRN
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
## 2017-07-17 ENCOUNTER — Encounter (INDEPENDENT_AMBULATORY_CARE_PROVIDER_SITE_OTHER): Payer: Self-pay | Admitting: Surgery

## 2017-07-17 ENCOUNTER — Ambulatory Visit (INDEPENDENT_AMBULATORY_CARE_PROVIDER_SITE_OTHER): Payer: BLUE CROSS/BLUE SHIELD | Admitting: Surgery

## 2017-07-17 VITALS — BP 133/94 | HR 93 | Ht 68.0 in | Wt 208.0 lb

## 2017-07-17 DIAGNOSIS — S300XXA Contusion of lower back and pelvis, initial encounter: Secondary | ICD-10-CM | POA: Diagnosis not present

## 2017-07-17 NOTE — Progress Notes (Signed)
Patient returns for recheck after having back contusion.  He states that he is 80% better.  Occasional twitches here and there but otherwise he is pleased with his progress at this point.  Is weaning himself off the muscle relaxer.  Exam Gait is normal.  His mild tenderness right medial scapular border.  Follow shoulder exam unremarkable.  Neurovascular  intact.  No focal motor deficits.   Plan Advised patient that I think that he will continue to improve over the next few weeks.  Follow-up in 6 weeks for recheck.  If he is doing well he may cancel that appointment.  In 3 weeks if the feels like he is going backwards in his progress he will call to let me know and I will send in a prescription for prednisone 6-day taper.  All questions answered.

## 2017-09-04 ENCOUNTER — Ambulatory Visit (INDEPENDENT_AMBULATORY_CARE_PROVIDER_SITE_OTHER): Payer: BLUE CROSS/BLUE SHIELD | Admitting: Surgery

## 2017-09-09 ENCOUNTER — Ambulatory Visit: Payer: BLUE CROSS/BLUE SHIELD | Admitting: Family Medicine

## 2017-09-09 ENCOUNTER — Encounter: Payer: Self-pay | Admitting: Family Medicine

## 2017-09-09 VITALS — BP 118/82 | HR 90 | Temp 98.7°F | Ht 68.0 in | Wt 208.6 lb

## 2017-09-09 DIAGNOSIS — I10 Essential (primary) hypertension: Secondary | ICD-10-CM

## 2017-09-09 DIAGNOSIS — N529 Male erectile dysfunction, unspecified: Secondary | ICD-10-CM | POA: Diagnosis not present

## 2017-09-09 DIAGNOSIS — F411 Generalized anxiety disorder: Secondary | ICD-10-CM

## 2017-09-09 DIAGNOSIS — Z23 Encounter for immunization: Secondary | ICD-10-CM | POA: Diagnosis not present

## 2017-09-09 DIAGNOSIS — E785 Hyperlipidemia, unspecified: Secondary | ICD-10-CM | POA: Diagnosis not present

## 2017-09-09 MED ORDER — ATORVASTATIN CALCIUM 20 MG PO TABS: 20.0000 mg | ORAL_TABLET | Freq: Every day | ORAL | 3 refills | Status: DC

## 2017-09-09 MED ORDER — LISINOPRIL-HYDROCHLOROTHIAZIDE 10-12.5 MG PO TABS: 1.0000 | ORAL_TABLET | Freq: Every day | ORAL | 3 refills | Status: DC

## 2017-09-09 MED ORDER — SILDENAFIL CITRATE 100 MG PO TABS: 100.0000 mg | ORAL_TABLET | Freq: Every day | ORAL | 11 refills | Status: DC | PRN

## 2017-09-09 NOTE — Progress Notes (Signed)
   Subjective:    Patient ID: Dean Alvarado, male    DOB: 02-May-1955, 62 y.o.   MRN: [id number]  HPI Here for follow up. He feels great but he does want to try something for erection problems. His BP is stable. He is exercising 3-4 days a week. His last lipid panel in December showed his LDL to be down from 127 to 52.    Review of Systems  Constitutional: Negative.   Respiratory: Negative.   Cardiovascular: Negative.   Neurological: Negative.        Objective:   Physical Exam  Constitutional: He is oriented to person, place, and time. He appears well-developed and well-nourished.  Neck: No thyromegaly present.  Cardiovascular: Normal rate, regular rhythm, normal heart sounds and intact distal pulses.  Pulmonary/Chest: Effort normal and breath sounds normal. No stridor. No respiratory distress. He has no wheezes. He has no rales.  Lymphadenopathy:    He has no cervical adenopathy.  Neurological: He is alert and oriented to person, place, and time.          Assessment & Plan:  Lipids and HTN are well controlled. Medications were refilled. For ED he will try Sildenafil 100 mg.  Alysia Penna, MD

## 2017-10-07 ENCOUNTER — Telehealth: Payer: Self-pay | Admitting: Family Medicine

## 2017-10-07 NOTE — Telephone Encounter (Signed)
Copied from [place] [id number]. Topic: Quick Communication - See Telephone Encounter >> Oct 07, 2017  1:30 PM Gardiner Ramus wrote: CRM for notification. See Telephone encounter for: 10/07/17. Pt called about side affects from Viagra. He is requesting a return call from [place] to discuss symptoms.

## 2017-10-08 NOTE — Telephone Encounter (Signed)
Called pt and was unable to leave a VM to call ack. Pt mailbox has NOT been set up yet.

## 2017-10-09 NOTE — Telephone Encounter (Signed)
Called pt and unable to leave a VM due to mailbox NOT being set up yet.

## 2017-10-09 NOTE — Telephone Encounter (Signed)
Pt states he took 1/2 tab sildenafil Monday morning and then went for a walk. He felt faint and came home and took his BP. The readings were very low at 80/55 and then 74/44. He then took a spoonful of salt and several glasses of water. His BP returned to normal later in the morning.  Pt states he did have carotic surgery and had elevated BP. He was placed on a drip and dropped too quickly to very low BP. He was told his body was overly sensitive to certain Htn medications.  Pt has not taken the medication since Monday.    Dr. Sarajane Jews - Please advise. Thanks!

## 2017-10-09 NOTE — Telephone Encounter (Signed)
Sent to PCP to advise 

## 2017-10-10 NOTE — Telephone Encounter (Signed)
Have him try taking 1/4 of a tablet (25 mg) and see how he does

## 2017-10-10 NOTE — Telephone Encounter (Signed)
Called and spoke with pt. Pt advised and voiced understanding.  

## 2017-11-05 ENCOUNTER — Encounter: Payer: Self-pay | Admitting: Vascular Surgery

## 2017-11-05 ENCOUNTER — Other Ambulatory Visit: Payer: Self-pay

## 2017-11-05 ENCOUNTER — Ambulatory Visit (INDEPENDENT_AMBULATORY_CARE_PROVIDER_SITE_OTHER): Payer: BLUE CROSS/BLUE SHIELD | Admitting: Vascular Surgery

## 2017-11-05 ENCOUNTER — Ambulatory Visit (HOSPITAL_COMMUNITY)
Admission: RE | Admit: 2017-11-05 | Discharge: 2017-11-05 | Disposition: A | Discharge status: Home / Self Care | Payer: BLUE CROSS/BLUE SHIELD | Source: Ambulatory Visit | Attending: Vascular Surgery | Admitting: Vascular Surgery

## 2017-11-05 VITALS — BP 122/91 | HR 84 | Temp 97.6°F | Resp 18 | Ht 68.0 in | Wt 203.0 lb

## 2017-11-05 DIAGNOSIS — I6522 Occlusion and stenosis of left carotid artery: Secondary | ICD-10-CM

## 2017-11-05 DIAGNOSIS — Z48812 Encounter for surgical aftercare following surgery on the circulatory system: Secondary | ICD-10-CM | POA: Diagnosis not present

## 2017-11-05 DIAGNOSIS — G459 Transient cerebral ischemic attack, unspecified: Secondary | ICD-10-CM

## 2017-11-05 NOTE — Progress Notes (Signed)
Patient name: Dean Alvarado MRN: [id number] DOB: 1955/07/03 Sex: male  REASON FOR VISIT:   Follow-up of carotid disease.  HPI:   Dean Alvarado is a pleasant 62 y.o. male who presented with a symptomatic left carotid stenosis.  He had an episode of amaurosis fugax in the left eye.  He was found to have a tight left carotid stenosis.  He underwent left carotid endarterectomy on 04/10/2017.  He comes in for 66-month follow-up visit.  Since I saw him last, he denies any history of stroke, TIAs, expressive or receptive aphasia, or amaurosis fugax.  His only complaint was some issues with impotence and he was concerned this might be related to poor circulation.  Past Medical History:  Diagnosis Date  . Carotid stenosis, left 04/08/2017  . Hay fever    "1-2 months/year" (04/10/2017)  . Hyperlipidemia   . Hypertension   . Peripheral vascular disease ([place])   . TIA (transient ischemic attack) 04/08/2017   Archie Endo 04/08/2017    Family History  Problem Relation Age of Onset  . Diabetes Unknown   . Hypertension Unknown   . Diabetes Father   . Hypertension Father     SOCIAL HISTORY: Social History   Tobacco Use  . Smoking status: Current Every Day Smoker    Packs/day: 1.00    Years: 34.00    Pack years: 34.00    Types: Cigarettes  . Smokeless tobacco: Never Used  Substance Use Topics  . Alcohol use: Yes    Alcohol/week: 2.4 oz    Types: 4 Glasses of wine per week    No Known Allergies  Current Outpatient Medications  Medication Sig Dispense Refill  . aspirin EC 81 MG tablet Take 81 mg by mouth daily.    Marland Kitchen atorvastatin (LIPITOR) 20 MG tablet Take 1 tablet (20 mg total) by mouth daily. 90 tablet 3  . ibuprofen (ADVIL,MOTRIN) 200 MG tablet Take 200 mg by mouth daily.    Marland Kitchen lisinopril-hydrochlorothiazide (PRINZIDE,ZESTORETIC) 10-12.5 MG tablet Take 1 tablet by mouth daily. 90 tablet 3  . LORazepam (ATIVAN) 1 MG tablet Take 1 tablet (1 mg total) by mouth every 6 (six) hours  as needed for anxiety. 90 tablet 5  . sildenafil (VIAGRA) 100 MG tablet Take 1 tablet (100 mg total) by mouth daily as needed for erectile dysfunction. 10 tablet 11   No current facility-administered medications for this visit.     REVIEW OF SYSTEMS:  [X]  denotes positive finding, [ ]  denotes negative finding Cardiac  Comments:  Chest pain or chest pressure:    Shortness of breath upon exertion:    Short of breath when lying flat:    Irregular heart rhythm:        Vascular    Pain in calf, thigh, or hip brought on by ambulation:    Pain in feet at night that wakes you up from your sleep:     Blood clot in your veins:    Leg swelling:         Pulmonary    Oxygen at home:    Productive cough:     Wheezing:         Neurologic    Sudden weakness in arms or legs:     Sudden numbness in arms or legs:     Sudden onset of difficulty speaking or slurred speech:    Temporary loss of vision in one eye:     Problems with dizziness:  Gastrointestinal    Blood in stool:     Vomited blood:         Genitourinary    Burning when urinating:     Blood in urine:        Psychiatric    Major depression:         Hematologic    Bleeding problems:    Problems with blood clotting too easily:        Skin    Rashes or ulcers:        Constitutional    Fever or chills:     PHYSICAL EXAM:   Vitals:   11/05/17 1501 11/05/17 1504  BP: 126/86 (!) 122/91  Pulse: 84 84  Resp: 18   Temp: 97.6 F (36.4 C)   TempSrc: Oral   SpO2: 97%   Weight: 203 lb (92.1 kg)   Height: 5\' 8"  (1.727 m)     GENERAL: The patient is a well-nourished male, in no acute distress. The vital signs are documented above. CARDIAC: There is a regular rate and rhythm.  VASCULAR: I do not detect carotid bruits. He has palpable femoral, popliteal, and pedal pulses bilaterally. He has no significant lower extremity swelling. PULMONARY: There is good air exchange bilaterally without wheezing or  rales. ABDOMEN: Soft and non-tender with normal pitched bowel sounds.  MUSCULOSKELETAL: There are no major deformities or cyanosis. NEUROLOGIC: No focal weakness or paresthesias are detected. SKIN: There are no ulcers or rashes noted. PSYCHIATRIC: The patient has a normal affect.  DATA:    CAROTID DUPLEX: I have independently interpreted his carotid duplex scan today.  On the left side he has no evidence of recurrent carotid stenosis.  On the right side he has a less than 39% stenosis.  Both vertebral arteries are patent with antegrade flow.  MEDICAL ISSUES:   STATUS POST LEFT CAROTID ENDARTERECTOMY: Patient is doing well status post left carotid endarterectomy.  His left carotid endarterectomy site is widely patent.  He has no significant stenosis on the right.  He will be due for a follow-up duplex scan in 6 months and then will be on a yearly schedule.  I have ordered his follow-up duplex scan.  He is on aspirin and is on a statin.  I will see him back in 6 months.  He knows to call sooner if he has problems.  IMPOTENCE: The patient has a normal pulse exam so I think it is unlikely that this is related to his vascular disease.  I have encouraged him to follow-up with his primary care physician or consider seeing a urologist if this problem persists.  Deitra Mayo [facility] [phone]

## 2017-11-17 ENCOUNTER — Other Ambulatory Visit: Payer: Self-pay | Admitting: Family Medicine

## 2017-11-17 NOTE — Telephone Encounter (Signed)
Last OV: 09/09/17 Last filled: 05/19/17, #90, 5 RF Sig: Take 1 tablet (1 mg total) by mouth every 6 (six) hours as needed for anxiety.

## 2017-11-18 NOTE — Telephone Encounter (Signed)
Call in #90 with 5 rf 

## 2017-12-01 ENCOUNTER — Other Ambulatory Visit: Payer: Self-pay

## 2017-12-01 DIAGNOSIS — G459 Transient cerebral ischemic attack, unspecified: Secondary | ICD-10-CM

## 2017-12-01 DIAGNOSIS — I6522 Occlusion and stenosis of left carotid artery: Secondary | ICD-10-CM

## 2017-12-31 ENCOUNTER — Telehealth: Payer: Self-pay | Admitting: Family Medicine

## 2017-12-31 DIAGNOSIS — N529 Male erectile dysfunction, unspecified: Secondary | ICD-10-CM

## 2017-12-31 NOTE — Telephone Encounter (Signed)
Copied from [place] [id number]. Topic: Quick Communication - See Telephone Encounter >> Dec 31, 2017  1:16 PM Neva Seat wrote: Pt needing to speak with Dr. Sarajane Jews or assistant regarding his  ED and needing a Urologist referral.  Will insurnace cover the visit.

## 2018-01-01 NOTE — Telephone Encounter (Signed)
Called and spoke with pt. He stated that the VIAGRA is noT working for him he does not get a full erection for penetration and shortly after taking the medication he will feel very dizzy and light headed and his BP will drop. Pt feels that he might need to see a specialist to be further evaluated.   Sent to PCP to advise

## 2018-01-02 NOTE — Telephone Encounter (Signed)
I went ahead and did a referral to Urology for this

## 2018-01-02 NOTE — Telephone Encounter (Signed)
Called and spoke with pt. Pt advised and voiced understanding.  

## 2018-01-23 DIAGNOSIS — N5201 Erectile dysfunction due to arterial insufficiency: Secondary | ICD-10-CM | POA: Diagnosis not present

## 2018-03-04 ENCOUNTER — Ambulatory Visit (INDEPENDENT_AMBULATORY_CARE_PROVIDER_SITE_OTHER): Payer: BLUE CROSS/BLUE SHIELD | Admitting: Family Medicine

## 2018-03-04 ENCOUNTER — Encounter: Payer: Self-pay | Admitting: Family Medicine

## 2018-03-04 VITALS — BP 140/88 | HR 72 | Temp 98.0°F | Ht 68.0 in | Wt 203.0 lb

## 2018-03-04 DIAGNOSIS — Z0000 Encounter for general adult medical examination without abnormal findings: Secondary | ICD-10-CM

## 2018-03-04 DIAGNOSIS — Z23 Encounter for immunization: Secondary | ICD-10-CM | POA: Diagnosis not present

## 2018-03-04 DIAGNOSIS — Z125 Encounter for screening for malignant neoplasm of prostate: Secondary | ICD-10-CM

## 2018-03-04 DIAGNOSIS — R739 Hyperglycemia, unspecified: Secondary | ICD-10-CM | POA: Diagnosis not present

## 2018-03-04 LAB — LIPID PANEL
Cholesterol: 133 mg/dL (ref 0–200)
HDL: 47.5 mg/dL (ref >39.00)
LDL Cholesterol: 54 mg/dL (ref 0–99)
NonHDL: 85.29
Total CHOL/HDL Ratio: 3
Triglycerides: 158 mg/dL — ABNORMAL HIGH (ref 0.0–149.0)
VLDL: 31.6 mg/dL (ref 0.0–40.0)

## 2018-03-04 LAB — TSH: TSH: 1.57 u[IU]/mL (ref 0.35–4.50)

## 2018-03-04 LAB — CBC WITH DIFFERENTIAL/PLATELET
Basophils Absolute: 0.1 10*3/uL (ref 0.0–0.1)
Basophils Relative: 0.7 % (ref 0.0–3.0)
Eosinophils Absolute: 0.3 10*3/uL (ref 0.0–0.7)
Eosinophils Relative: 3.9 % (ref 0.0–5.0)
HCT: 44.4 % (ref 39.0–52.0)
Hemoglobin: 15.7 g/dL (ref 13.0–17.0)
Lymphocytes Relative: 21.7 % (ref 12.0–46.0)
Lymphs Abs: 1.7 10*3/uL (ref 0.7–4.0)
MCHC: 35.3 g/dL (ref 30.0–36.0)
MCV: 94.4 fl (ref 78.0–100.0)
Monocytes Absolute: 0.8 10*3/uL (ref 0.1–1.0)
Monocytes Relative: 10.6 % (ref 3.0–12.0)
Neutro Abs: 4.9 10*3/uL (ref 1.4–7.7)
Neutrophils Relative %: 63.1 % (ref 43.0–77.0)
Platelets: 285 10*3/uL (ref 150.0–400.0)
RBC: 4.7 Mil/uL (ref 4.22–5.81)
RDW: 13 % (ref 11.5–15.5)
WBC: 7.8 10*3/uL (ref 4.0–10.5)

## 2018-03-04 LAB — POC URINALSYSI DIPSTICK (AUTOMATED)
Bilirubin, UA: NEGATIVE
Blood, UA: NEGATIVE
Glucose, UA: NEGATIVE
Ketones, UA: NEGATIVE
Leukocytes, UA: NEGATIVE
Nitrite, UA: NEGATIVE
Protein, UA: NEGATIVE
Spec Grav, UA: 1.02 (ref 1.010–1.025)
Urobilinogen, UA: 0.2 E.U./dL
pH, UA: 6 (ref 5.0–8.0)

## 2018-03-04 LAB — PSA: PSA: 1.55 ng/mL (ref 0.10–4.00)

## 2018-03-04 LAB — BASIC METABOLIC PANEL
BUN: 11 mg/dL (ref 6–23)
CO2: 30 mEq/L (ref 19–32)
Calcium: 9.7 mg/dL (ref 8.4–10.5)
Chloride: 101 mEq/L (ref 96–112)
Creatinine, Ser: 0.86 mg/dL (ref 0.40–1.50)
GFR: 95.48 mL/min (ref >60.00)
Glucose, Bld: 136 mg/dL — ABNORMAL HIGH (ref 70–99)
Potassium: 4 mEq/L (ref 3.5–5.1)
Sodium: 137 mEq/L (ref 135–145)

## 2018-03-04 LAB — HEPATIC FUNCTION PANEL
ALT: 36 U/L (ref 0–53)
AST: 20 U/L (ref 0–37)
Albumin: 4.6 g/dL (ref 3.5–5.2)
Alkaline Phosphatase: 92 U/L (ref 39–117)
Bilirubin, Direct: 0.1 mg/dL (ref 0.0–0.3)
Total Bilirubin: 0.7 mg/dL (ref 0.2–1.2)
Total Protein: 7.3 g/dL (ref 6.0–8.3)

## 2018-03-04 LAB — HEMOGLOBIN A1C: Hgb A1c MFr Bld: 6.2 % (ref 4.6–6.5)

## 2018-03-04 MED ORDER — FLUOXETINE HCL 10 MG PO CAPS: 10.0000 mg | ORAL_CAPSULE | Freq: Every day | ORAL | 3 refills | Status: DC

## 2018-03-04 NOTE — Progress Notes (Signed)
   Subjective:    Patient ID: Dean Alvarado, male    DOB: May 01, 1955, 61 y.o.   MRN: [id number]  HPI Here for a well exam. He continues to struggle a bit with ED. He does not tolerate Viagra or Cialis. He saw Dr. Louis Meckel who recommended trying injections, but Dean Alvarado is hesitant to try this. He has been a little depressed for a few months. He feels sad and unmotivated at times, but sleep and appetite are intact.    Review of Systems  Constitutional: Negative.   HENT: Negative.   Eyes: Negative.   Respiratory: Negative.   Cardiovascular: Negative.   Gastrointestinal: Negative.   Genitourinary: Negative.   Musculoskeletal: Negative.   Skin: Negative.   Neurological: Negative.   Psychiatric/Behavioral: Positive for dysphoric mood. Negative for agitation, behavioral problems, hallucinations, self-injury, sleep disturbance and suicidal ideas.       Objective:   Physical Exam  Constitutional: He is oriented to person, place, and time. He appears well-developed and well-nourished. No distress.  HENT:  Head: Normocephalic and atraumatic.  Right Ear: External ear normal.  Left Ear: External ear normal.  Nose: Nose normal.  Mouth/Throat: Oropharynx is clear and moist. No oropharyngeal exudate.  Eyes: Pupils are equal, round, and reactive to light. Conjunctivae and EOM are normal. Right eye exhibits no discharge. Left eye exhibits no discharge. No scleral icterus.  Neck: Neck supple. No JVD present. No tracheal deviation present. No thyromegaly present.  Cardiovascular: Normal rate, regular rhythm, normal heart sounds and intact distal pulses. Exam reveals no gallop and no friction rub.  No murmur heard. Pulmonary/Chest: Effort normal and breath sounds normal. No respiratory distress. He has no wheezes. He has no rales. He exhibits no tenderness.  Abdominal: Soft. Bowel sounds are normal. He exhibits no distension and no mass. There is no tenderness. There is no rebound and no guarding.    Genitourinary: Rectum normal, prostate normal and penis normal. Rectal exam shows guaiac negative stool. No penile tenderness.  Musculoskeletal: Normal range of motion. He exhibits no edema or tenderness.  Lymphadenopathy:    He has no cervical adenopathy.  Neurological: He is alert and oriented to person, place, and time. He has normal reflexes. He displays normal reflexes. No cranial nerve deficit. He exhibits normal muscle tone. Coordination normal.  Skin: Skin is warm and dry. No rash noted. He is not diaphoretic. No erythema. No pallor.  Psychiatric: He has a normal mood and affect. His behavior is normal. Judgment and thought content normal.          Assessment & Plan:  Well exam. We discussed diet and exercise. Get fasting labs. For the depression he will try Prozac 10 mg daily and recheck in 4 weeks.  Alysia Penna, MD

## 2018-03-30 ENCOUNTER — Other Ambulatory Visit: Payer: Self-pay | Admitting: Family Medicine

## 2018-04-01 ENCOUNTER — Encounter: Payer: Self-pay | Admitting: Vascular Surgery

## 2018-04-01 ENCOUNTER — Ambulatory Visit: Payer: BLUE CROSS/BLUE SHIELD | Admitting: Vascular Surgery

## 2018-04-01 ENCOUNTER — Ambulatory Visit (HOSPITAL_COMMUNITY)
Admission: RE | Admit: 2018-04-01 | Discharge: 2018-04-01 | Disposition: A | Discharge status: Home / Self Care | Payer: BLUE CROSS/BLUE SHIELD | Source: Ambulatory Visit | Attending: Vascular Surgery | Admitting: Vascular Surgery

## 2018-04-01 VITALS — BP 128/93 | HR 86 | Temp 98.4°F | Resp 16 | Ht 68.0 in | Wt 202.0 lb

## 2018-04-01 DIAGNOSIS — I6522 Occlusion and stenosis of left carotid artery: Secondary | ICD-10-CM

## 2018-04-01 DIAGNOSIS — G459 Transient cerebral ischemic attack, unspecified: Secondary | ICD-10-CM | POA: Diagnosis not present

## 2018-04-01 DIAGNOSIS — Z48812 Encounter for surgical aftercare following surgery on the circulatory system: Secondary | ICD-10-CM

## 2018-04-01 NOTE — Progress Notes (Signed)
Patient name: Dean Alvarado MRN: [id number] DOB: Apr 11, 1956 Sex: male  REASON FOR VISIT:   Follow-up of carotid disease.  HPI:   Dean Alvarado is a pleasant 62 y.o. male who presented with a symptomatic left carotid stenosis.  He had amaurosis fugax in the left eye.  He underwent a left carotid endarterectomy for a tight left carotid stenosis on 04/10/2017.  At the time of his six-month follow-up visit on 11/05/2017, the left carotid endarterectomy site was widely patent with no significant stenosis on the right.  He comes in for his yearly follow-up visit.  Since I saw him last, he denies any history of stroke, TIAs, expressive or receptive aphasia, or amaurosis fugax.  He is on aspirin and is on a statin.  He does vape.  Past Medical History:  Diagnosis Date  . Carotid stenosis, left 04/08/2017  . Erectile dysfunction    sees Dr. Louis Meckel   . Hay fever    "1-2 months/year" (04/10/2017)  . Hyperlipidemia   . Hypertension   . Peripheral vascular disease ([place])   . TIA (transient ischemic attack) 04/08/2017   Archie Endo 04/08/2017    Family History  Problem Relation Age of Onset  . Diabetes Unknown   . Hypertension Unknown   . Diabetes Father   . Hypertension Father     SOCIAL HISTORY: Social History   Tobacco Use  . Smoking status: Current Every Day Smoker    Packs/day: 1.00    Years: 34.00    Pack years: 34.00    Types: Cigarettes  . Smokeless tobacco: Never Used  Substance Use Topics  . Alcohol use: Yes    Alcohol/week: 4.0 standard drinks    Types: 4 Glasses of wine per week    No Known Allergies  Current Outpatient Medications  Medication Sig Dispense Refill  . aspirin EC 81 MG tablet Take 81 mg by mouth daily.    Marland Kitchen atorvastatin (LIPITOR) 20 MG tablet Take 1 tablet (20 mg total) by mouth daily. 90 tablet 3  . FLUoxetine (PROZAC) 10 MG capsule Take 1 capsule (10 mg total) by mouth daily. 30 capsule 3  . ibuprofen (ADVIL,MOTRIN) 200 MG tablet Take  200 mg by mouth daily.    Marland Kitchen lisinopril-hydrochlorothiazide (PRINZIDE,ZESTORETIC) 10-12.5 MG tablet Take 1 tablet by mouth daily. 90 tablet 3  . LORazepam (ATIVAN) 1 MG tablet TAKE 1 TABLET BY MOUTH EVERY 6 HOURS AS NEEDED FOR ANXIETY 90 tablet 5   No current facility-administered medications for this visit.     REVIEW OF SYSTEMS:  [X]  denotes positive finding, [ ]  denotes negative finding Cardiac  Comments:  Chest pain or chest pressure:    Shortness of breath upon exertion:    Short of breath when lying flat:    Irregular heart rhythm:        Vascular    Pain in calf, thigh, or hip brought on by ambulation:    Pain in feet at night that wakes you up from your sleep:     Blood clot in your veins:    Leg swelling:         Pulmonary    Oxygen at home:    Productive cough:     Wheezing:         Neurologic    Sudden weakness in arms or legs:     Sudden numbness in arms or legs:     Sudden onset of difficulty speaking or slurred speech:    Temporary loss  of vision in one eye:     Problems with dizziness:         Gastrointestinal    Blood in stool:     Vomited blood:         Genitourinary    Burning when urinating:     Blood in urine:        Psychiatric    Major depression:         Hematologic    Bleeding problems:    Problems with blood clotting too easily:        Skin    Rashes or ulcers:        Constitutional    Fever or chills:     PHYSICAL EXAM:   Vitals:   04/01/18 1227 04/01/18 1228  BP: 133/88 (!) 128/93  Pulse: 86   Resp: 16   Temp: 98.4 F (36.9 C)   SpO2: 95%   Weight: 202 lb (91.6 kg)   Height: 5\' 8"  (1.727 m)     GENERAL: The patient is a well-nourished male, in no acute distress. The vital signs are documented above. CARDIAC: There is a regular rate and rhythm.  VASCULAR: I do not detect carotid bruits. PULMONARY: There is good air exchange bilaterally without wheezing or rales. MUSCULOSKELETAL: There are no major deformities or  cyanosis. NEUROLOGIC: No focal weakness or paresthesias are detected. SKIN: There are no ulcers or rashes noted. PSYCHIATRIC: The patient has a normal affect.  DATA:    CAROTID DUPLEX: I did apparently interpreted his carotid duplex scan today.  His left carotid endarterectomy site is widely patent.  There is no significant right carotid stenosis.  Both vertebral arteries are patent with antegrade flow.  MEDICAL ISSUES:   STATUS POST LEFT CAROTID ENDARTERECTOMY: Patient is doing well status post left carotid endarterectomy.  His left carotid endarterectomy site is widely patent and he has no significant stenosis on the right.  He is on aspirin and is on a statin.  I have ordered a follow-up carotid duplex scan in 1 year and I will see him back at that time.  He knows to call sooner if he has problems.  Deitra Mayo [facility] [phone]

## 2018-05-03 ENCOUNTER — Encounter: Payer: Self-pay | Admitting: Family Medicine

## 2018-05-04 NOTE — Telephone Encounter (Signed)
Dr. Fry please advise. Thanks  

## 2018-05-06 MED ORDER — FLUOXETINE HCL 20 MG PO TABS: 20.0000 mg | ORAL_TABLET | Freq: Every day | ORAL | 3 refills | Status: DC

## 2018-05-06 NOTE — Telephone Encounter (Signed)
Increase Prozac to 20 mg daily, call in #90 with 3 rf

## 2018-05-13 ENCOUNTER — Other Ambulatory Visit: Payer: Self-pay | Admitting: Family Medicine

## 2018-05-14 NOTE — Telephone Encounter (Signed)
Dr. Sarajane Jews please advise for refills. Thanks

## 2018-05-15 NOTE — Telephone Encounter (Signed)
Call in #90 with 5 rf 

## 2018-05-19 NOTE — Telephone Encounter (Signed)
Refill has been called to the pharmacy and left on the VM.  

## 2018-09-03 ENCOUNTER — Other Ambulatory Visit: Payer: Self-pay | Admitting: Family Medicine

## 2018-11-03 ENCOUNTER — Other Ambulatory Visit: Payer: Self-pay | Admitting: Family Medicine

## 2018-11-05 NOTE — Telephone Encounter (Signed)
Pt called to check status. Please advise.  °

## 2018-11-06 NOTE — Telephone Encounter (Signed)
Done

## 2019-01-04 ENCOUNTER — Ambulatory Visit (INDEPENDENT_AMBULATORY_CARE_PROVIDER_SITE_OTHER): Payer: BC Managed Care – PPO | Admitting: Family Medicine

## 2019-01-04 ENCOUNTER — Other Ambulatory Visit: Payer: Self-pay

## 2019-01-04 ENCOUNTER — Encounter: Payer: Self-pay | Admitting: Family Medicine

## 2019-01-04 VITALS — BP 132/90 | HR 70 | Temp 98.3°F | Ht 68.0 in | Wt 213.8 lb

## 2019-01-04 DIAGNOSIS — R739 Hyperglycemia, unspecified: Secondary | ICD-10-CM | POA: Diagnosis not present

## 2019-01-04 DIAGNOSIS — Z209 Contact with and (suspected) exposure to unspecified communicable disease: Secondary | ICD-10-CM | POA: Diagnosis not present

## 2019-01-04 DIAGNOSIS — Z23 Encounter for immunization: Secondary | ICD-10-CM

## 2019-01-04 LAB — HEMOGLOBIN A1C: Hgb A1c MFr Bld: 7.4 % — ABNORMAL HIGH (ref 4.6–6.5)

## 2019-01-04 NOTE — Addendum Note (Signed)
Addended by: Gwenyth Ober R on: 01/04/2019 05:30 PM   Modules accepted: Orders

## 2019-01-04 NOTE — Patient Instructions (Addendum)
Health Maintenance Due  Topic Date Due  . Hepatitis C Screening  1955-09-16  . INFLUENZA VACCINE  11/21/2018    Depression screen [facility] 2/9 01/04/2019 03/04/2018  Decreased Interest 0 0  Down, Depressed, Hopeless 0 1  PHQ - 2 Score 0 1

## 2019-01-04 NOTE — Progress Notes (Signed)
   Subjective:    Patient ID: Dean Alvarado, male    DOB: 1955/06/07, 63 y.o.   MRN: [id number]  HPI Here to get a flu shot and to ask about a test for hepatitis C. He feels well. He admits to gaining some weight during the virus pandemic.    Review of Systems  Constitutional: Negative.   Respiratory: Negative.   Cardiovascular: Negative.        Objective:   Physical Exam Constitutional:      Appearance: Normal appearance.  Cardiovascular:     Rate and Rhythm: Normal rate and regular rhythm.     Pulses: Normal pulses.     Heart sounds: Normal heart sounds.  Pulmonary:     Effort: Pulmonary effort is normal.     Breath sounds: Normal breath sounds.  Neurological:     Mental Status: He is alert.           Assessment & Plan:  He is given a flu shot. We will check for hepatitis C antibodies and we will check an A1c. Alysia Penna, MD

## 2019-01-05 LAB — HEPATITIS C ANTIBODY
Hepatitis C Ab: NONREACTIVE
SIGNAL TO CUT-OFF: 0.02 (ref <1.00)

## 2019-03-04 ENCOUNTER — Other Ambulatory Visit: Payer: Self-pay | Admitting: Family Medicine

## 2019-05-03 ENCOUNTER — Other Ambulatory Visit: Payer: Self-pay | Admitting: Family Medicine

## 2019-05-03 NOTE — Telephone Encounter (Signed)
Last fill 11/06/2018 Last OV 01/04/2019  Ok to fill?

## 2019-05-12 ENCOUNTER — Encounter (HOSPITAL_COMMUNITY): Payer: BC Managed Care – PPO

## 2019-05-12 ENCOUNTER — Ambulatory Visit: Payer: BC Managed Care – PPO | Admitting: Vascular Surgery

## 2019-06-16 ENCOUNTER — Encounter: Payer: Self-pay | Admitting: Family Medicine

## 2019-06-17 MED ORDER — METFORMIN HCL 500 MG PO TABS: 500.0000 mg | ORAL_TABLET | Freq: Two times a day (BID) | ORAL | 3 refills | Status: DC

## 2019-06-17 NOTE — Telephone Encounter (Signed)
I agree. Call in Metformin 500 mg BID, #90 with 3 rf. Recheck an A1c sometime soon

## 2019-07-12 ENCOUNTER — Encounter: Payer: Self-pay | Admitting: Gastroenterology

## 2019-07-19 ENCOUNTER — Other Ambulatory Visit: Payer: Self-pay | Admitting: Family Medicine

## 2019-09-02 ENCOUNTER — Other Ambulatory Visit: Payer: Self-pay | Admitting: Family Medicine

## 2019-10-11 ENCOUNTER — Other Ambulatory Visit: Payer: Self-pay

## 2019-10-12 ENCOUNTER — Encounter: Payer: Self-pay | Admitting: Family Medicine

## 2019-10-12 ENCOUNTER — Ambulatory Visit (INDEPENDENT_AMBULATORY_CARE_PROVIDER_SITE_OTHER): Payer: BLUE CROSS/BLUE SHIELD | Admitting: Family Medicine

## 2019-10-12 VITALS — BP 130/70 | HR 90 | Temp 98.0°F | Wt 210.6 lb

## 2019-10-12 DIAGNOSIS — R739 Hyperglycemia, unspecified: Secondary | ICD-10-CM

## 2019-10-12 DIAGNOSIS — F411 Generalized anxiety disorder: Secondary | ICD-10-CM

## 2019-10-12 DIAGNOSIS — E785 Hyperlipidemia, unspecified: Secondary | ICD-10-CM | POA: Diagnosis not present

## 2019-10-12 DIAGNOSIS — I10 Essential (primary) hypertension: Secondary | ICD-10-CM | POA: Diagnosis not present

## 2019-10-12 LAB — POCT GLYCOSYLATED HEMOGLOBIN (HGB A1C): Hemoglobin A1C: 6.5 % — AB (ref 4.0–5.6)

## 2019-10-12 LAB — HEPATIC FUNCTION PANEL
ALT: 39 U/L (ref 0–53)
AST: 22 U/L (ref 0–37)
Albumin: 4.5 g/dL (ref 3.5–5.2)
Alkaline Phosphatase: 73 U/L (ref 39–117)
Bilirubin, Direct: 0.2 mg/dL (ref 0.0–0.3)
Total Bilirubin: 0.7 mg/dL (ref 0.2–1.2)
Total Protein: 7 g/dL (ref 6.0–8.3)

## 2019-10-12 LAB — CBC WITH DIFFERENTIAL/PLATELET
Basophils Absolute: 0.1 10*3/uL (ref 0.0–0.1)
Basophils Relative: 1 % (ref 0.0–3.0)
Eosinophils Absolute: 0.1 10*3/uL (ref 0.0–0.7)
Eosinophils Relative: 2.6 % (ref 0.0–5.0)
HCT: 41.6 % (ref 39.0–52.0)
Hemoglobin: 14.4 g/dL (ref 13.0–17.0)
Lymphocytes Relative: 28.4 % (ref 12.0–46.0)
Lymphs Abs: 1.4 10*3/uL (ref 0.7–4.0)
MCHC: 34.7 g/dL (ref 30.0–36.0)
MCV: 92 fl (ref 78.0–100.0)
Monocytes Absolute: 0.6 10*3/uL (ref 0.1–1.0)
Monocytes Relative: 12.9 % — ABNORMAL HIGH (ref 3.0–12.0)
Neutro Abs: 2.8 10*3/uL (ref 1.4–7.7)
Neutrophils Relative %: 55.1 % (ref 43.0–77.0)
Platelets: 273 10*3/uL (ref 150.0–400.0)
RBC: 4.52 Mil/uL (ref 4.22–5.81)
RDW: 12.8 % (ref 11.5–15.5)
WBC: 5 10*3/uL (ref 4.0–10.5)

## 2019-10-12 LAB — BASIC METABOLIC PANEL
BUN: 18 mg/dL (ref 6–23)
CO2: 25 mEq/L (ref 19–32)
Calcium: 9.3 mg/dL (ref 8.4–10.5)
Chloride: 101 mEq/L (ref 96–112)
Creatinine, Ser: 0.81 mg/dL (ref 0.40–1.50)
GFR: 95.77 mL/min (ref >60.00)
Glucose, Bld: 142 mg/dL — ABNORMAL HIGH (ref 70–99)
Potassium: 4.4 mEq/L (ref 3.5–5.1)
Sodium: 132 mEq/L — ABNORMAL LOW (ref 135–145)

## 2019-10-12 LAB — TSH: TSH: 1.63 u[IU]/mL (ref 0.35–4.50)

## 2019-10-12 LAB — LIPID PANEL
Cholesterol: 130 mg/dL (ref 0–200)
HDL: 42.7 mg/dL (ref >39.00)
LDL Cholesterol: 65 mg/dL (ref 0–99)
NonHDL: 87.37
Total CHOL/HDL Ratio: 3
Triglycerides: 114 mg/dL (ref 0.0–149.0)
VLDL: 22.8 mg/dL (ref 0.0–40.0)

## 2019-10-12 LAB — PSA: PSA: 1.63 ng/mL (ref 0.10–4.00)

## 2019-10-12 MED ORDER — LORAZEPAM 1 MG PO TABS: 1.0000 mg | ORAL_TABLET | Freq: Four times a day (QID) | ORAL | 5 refills | Status: DC | PRN

## 2019-10-12 MED ORDER — LISINOPRIL-HYDROCHLOROTHIAZIDE 10-12.5 MG PO TABS: 1.0000 | ORAL_TABLET | Freq: Every day | ORAL | 3 refills | Status: DC

## 2019-10-12 MED ORDER — ATORVASTATIN CALCIUM 20 MG PO TABS: 20.0000 mg | ORAL_TABLET | Freq: Every day | ORAL | 3 refills | Status: DC

## 2019-10-12 NOTE — Progress Notes (Signed)
   Subjective:    Patient ID: Dean Alvarado, male    DOB: [birth year], 64 y.o.   MRN: [id number]  HPI Here to follow up. He feels well. He has worked on diet and losing weight. His A1c today is 6.5. His anxiety is well controlled.     Review of Systems  Constitutional: Negative.   Respiratory: Negative.   Cardiovascular: Negative.   Psychiatric/Behavioral: Negative.        Objective:   Physical Exam Constitutional:      Appearance: Normal appearance.  Cardiovascular:     Rate and Rhythm: Normal rate and regular rhythm.     Pulses: Normal pulses.     Heart sounds: Normal heart sounds.  Pulmonary:     Effort: Pulmonary effort is normal.     Breath sounds: Normal breath sounds.  Neurological:     General: No focal deficit present.     Mental Status: He is alert and oriented to person, place, and time.  Psychiatric:        Mood and Affect: Mood normal.        Thought Content: Thought content normal.        Judgment: Judgment normal.           Assessment & Plan:  He is doing well. His HTN and anxiety are stable. Get fasting labs. Alysia Penna, MD

## 2019-11-01 ENCOUNTER — Other Ambulatory Visit: Payer: Self-pay | Admitting: Family Medicine

## 2019-11-01 NOTE — Telephone Encounter (Signed)
Last filled 10/12/2019- refills should be on file please deny this request.  Metformin is due for refills

## 2019-11-02 ENCOUNTER — Other Ambulatory Visit: Payer: Self-pay | Admitting: Family Medicine

## 2019-11-02 NOTE — Telephone Encounter (Signed)
lisinopril-hydrochlorothiazide (ZESTORETIC) 10-12.5 MG tablet  [id number] - [place], [place] - [address] AT [address] Phone:  [phone]  Fax:  [phone]     The pharmacy is not able to refill this medication. They are telling the patient that he needs authorization. I see in the medications that there a refills for this medication.  Please advise

## 2019-11-03 ENCOUNTER — Encounter: Payer: Self-pay | Admitting: Family Medicine

## 2019-11-03 MED ORDER — LORAZEPAM 1 MG PO TABS: 1.0000 mg | ORAL_TABLET | Freq: Four times a day (QID) | ORAL | 5 refills | Status: DC | PRN

## 2019-11-03 NOTE — Addendum Note (Signed)
Addended by: Alysia Penna A on: 11/03/2019 01:17 PM   Modules accepted: Orders

## 2019-11-03 NOTE — Telephone Encounter (Signed)
I sent it again  

## 2020-01-31 ENCOUNTER — Other Ambulatory Visit: Payer: Self-pay | Admitting: Family Medicine

## 2020-03-31 ENCOUNTER — Other Ambulatory Visit: Payer: Self-pay | Admitting: Family Medicine

## 2020-04-30 ENCOUNTER — Other Ambulatory Visit: Payer: Self-pay | Admitting: Family Medicine

## 2020-05-01 ENCOUNTER — Encounter: Payer: Self-pay | Admitting: Family Medicine

## 2020-05-01 MED ORDER — LORAZEPAM 1 MG PO TABS: 1.0000 mg | ORAL_TABLET | Freq: Four times a day (QID) | ORAL | 5 refills | Status: DC | PRN

## 2020-05-01 NOTE — Telephone Encounter (Signed)
Done

## 2020-06-03 ENCOUNTER — Encounter: Payer: Self-pay | Admitting: Family Medicine

## 2020-06-05 ENCOUNTER — Other Ambulatory Visit: Payer: Self-pay

## 2020-06-05 MED ORDER — FLUOXETINE HCL 20 MG PO TABS: 20.0000 mg | ORAL_TABLET | Freq: Every day | ORAL | 0 refills | Status: DC

## 2020-06-05 MED ORDER — METFORMIN HCL 500 MG PO TABS: ORAL_TABLET | ORAL | 0 refills | Status: DC

## 2020-06-19 ENCOUNTER — Encounter: Payer: Self-pay | Admitting: Family Medicine

## 2020-06-19 ENCOUNTER — Other Ambulatory Visit: Payer: Self-pay

## 2020-06-19 ENCOUNTER — Ambulatory Visit (INDEPENDENT_AMBULATORY_CARE_PROVIDER_SITE_OTHER): Payer: Medicare HMO | Admitting: Family Medicine

## 2020-06-19 VITALS — BP 136/92 | HR 94 | Temp 98.3°F | Ht 68.0 in | Wt 212.0 lb

## 2020-06-19 DIAGNOSIS — E785 Hyperlipidemia, unspecified: Secondary | ICD-10-CM | POA: Diagnosis not present

## 2020-06-19 DIAGNOSIS — I10 Essential (primary) hypertension: Secondary | ICD-10-CM

## 2020-06-19 DIAGNOSIS — R739 Hyperglycemia, unspecified: Secondary | ICD-10-CM

## 2020-06-19 DIAGNOSIS — E1165 Type 2 diabetes mellitus with hyperglycemia: Secondary | ICD-10-CM | POA: Insufficient documentation

## 2020-06-19 DIAGNOSIS — F411 Generalized anxiety disorder: Secondary | ICD-10-CM

## 2020-06-19 DIAGNOSIS — Z0000 Encounter for general adult medical examination without abnormal findings: Secondary | ICD-10-CM | POA: Diagnosis not present

## 2020-06-19 LAB — BASIC METABOLIC PANEL
BUN: 14 mg/dL (ref 6–23)
CO2: 26 mEq/L (ref 19–32)
Calcium: 9.6 mg/dL (ref 8.4–10.5)
Chloride: 100 mEq/L (ref 96–112)
Creatinine, Ser: 0.94 mg/dL (ref 0.40–1.50)
GFR: 85.23 mL/min (ref >60.00)
Glucose, Bld: 150 mg/dL — ABNORMAL HIGH (ref 70–99)
Potassium: 4.3 mEq/L (ref 3.5–5.1)
Sodium: 134 mEq/L — ABNORMAL LOW (ref 135–145)

## 2020-06-19 LAB — LIPID PANEL
Cholesterol: 121 mg/dL (ref 0–200)
HDL: 50.8 mg/dL (ref >39.00)
LDL Cholesterol: 52 mg/dL (ref 0–99)
NonHDL: 70.07
Total CHOL/HDL Ratio: 2
Triglycerides: 90 mg/dL (ref 0.0–149.0)
VLDL: 18 mg/dL (ref 0.0–40.0)

## 2020-06-19 LAB — HEMOGLOBIN A1C: Hgb A1c MFr Bld: 6.9 % — ABNORMAL HIGH (ref 4.6–6.5)

## 2020-06-19 LAB — HEPATIC FUNCTION PANEL
ALT: 37 U/L (ref 0–53)
AST: 19 U/L (ref 0–37)
Albumin: 4.4 g/dL (ref 3.5–5.2)
Alkaline Phosphatase: 68 U/L (ref 39–117)
Bilirubin, Direct: 0.1 mg/dL (ref 0.0–0.3)
Total Bilirubin: 0.5 mg/dL (ref 0.2–1.2)
Total Protein: 7 g/dL (ref 6.0–8.3)

## 2020-06-19 MED ORDER — LISINOPRIL-HYDROCHLOROTHIAZIDE 20-12.5 MG PO TABS: 1.0000 | ORAL_TABLET | Freq: Every day | ORAL | 3 refills | Status: DC

## 2020-06-19 NOTE — Progress Notes (Signed)
   Subjective:    Patient ID: PHILO KURTZ, male    DOB: February 04, 1956, 65 y.o.   MRN: [id number]  HPI Here to follow up. He feels well. His BP at home has been borderline high. He does not check his blood glucoses.    Review of Systems  Constitutional: Negative.   Respiratory: Negative.   Cardiovascular: Negative.        Objective:   Physical Exam Constitutional:      Appearance: Normal appearance.  Cardiovascular:     Rate and Rhythm: Normal rate and regular rhythm.     Pulses: Normal pulses.     Heart sounds: Normal heart sounds.  Pulmonary:     Effort: Pulmonary effort is normal.     Breath sounds: Normal breath sounds.  Neurological:     Mental Status: He is alert.           Assessment & Plan:  For the HTN, we will increase the Lisinopril HCT to 20-12.5 daily. For the diabetes and hyperlipidemia, we will get fasting labs today. He is past due for a colonoscopy, so we will arrange this.  Alysia Penna, MD

## 2020-06-29 ENCOUNTER — Encounter: Payer: Self-pay | Admitting: Gastroenterology

## 2020-08-16 ENCOUNTER — Ambulatory Visit (AMBULATORY_SURGERY_CENTER): Payer: Self-pay | Admitting: *Deleted

## 2020-08-16 ENCOUNTER — Other Ambulatory Visit: Payer: Self-pay

## 2020-08-16 VITALS — Ht 68.0 in | Wt 216.0 lb

## 2020-08-16 DIAGNOSIS — Z1211 Encounter for screening for malignant neoplasm of colon: Secondary | ICD-10-CM

## 2020-08-16 MED ORDER — NA SULFATE-K SULFATE-MG SULF 17.5-3.13-1.6 GM/177ML PO SOLN: ORAL | 0 refills | Status: DC

## 2020-08-16 NOTE — Progress Notes (Signed)
Patient is here in-person for PV. Patient denies any allergies to eggs or soy. Patient denies any problems with anesthesia/sedation. Patient denies any oxygen use at home. Patient denies taking any diet/weight loss medications or blood thinners. Patient is not being treated for MRSA or C-diff. Patient is aware of our care-partner policy and Covid-19 safety protocol. EMMI education assigned to the patient for the procedure, sent to MyChart.   Patient is fully COVID-19 vaccinated, per patient.    

## 2020-08-28 ENCOUNTER — Other Ambulatory Visit: Payer: Self-pay | Admitting: Gastroenterology

## 2020-08-28 ENCOUNTER — Encounter: Payer: Self-pay | Admitting: Gastroenterology

## 2020-08-28 ENCOUNTER — Other Ambulatory Visit: Payer: Self-pay

## 2020-08-28 ENCOUNTER — Ambulatory Visit (AMBULATORY_SURGERY_CENTER): Payer: Medicare HMO | Admitting: Gastroenterology

## 2020-08-28 VITALS — BP 125/89 | HR 75 | Temp 98.4°F | Resp 15 | Ht 68.0 in | Wt 216.0 lb

## 2020-08-28 DIAGNOSIS — D120 Benign neoplasm of cecum: Secondary | ICD-10-CM

## 2020-08-28 DIAGNOSIS — Z1211 Encounter for screening for malignant neoplasm of colon: Secondary | ICD-10-CM | POA: Diagnosis not present

## 2020-08-28 DIAGNOSIS — K612 Anorectal abscess: Secondary | ICD-10-CM

## 2020-08-28 DIAGNOSIS — K621 Rectal polyp: Secondary | ICD-10-CM | POA: Diagnosis not present

## 2020-08-28 DIAGNOSIS — K635 Polyp of colon: Secondary | ICD-10-CM | POA: Diagnosis not present

## 2020-08-28 DIAGNOSIS — D123 Benign neoplasm of transverse colon: Secondary | ICD-10-CM

## 2020-08-28 DIAGNOSIS — D122 Benign neoplasm of ascending colon: Secondary | ICD-10-CM | POA: Diagnosis not present

## 2020-08-28 DIAGNOSIS — D128 Benign neoplasm of rectum: Secondary | ICD-10-CM

## 2020-08-28 HISTORY — PX: COLONOSCOPY: SHX174

## 2020-08-28 MED ORDER — SODIUM CHLORIDE 0.9 % IV SOLN: 500.0000 mL | Freq: Once | INTRAVENOUS | Status: DC

## 2020-08-28 NOTE — Progress Notes (Signed)
C.W. vital signs. 

## 2020-08-28 NOTE — Progress Notes (Signed)
Pt's states no medical or surgical changes since previsit or office visit. 

## 2020-08-28 NOTE — Progress Notes (Signed)
Pt Drowsy. VSS. To PACU, report to RN. No anesthetic complications noted.  

## 2020-08-28 NOTE — Op Note (Signed)
[facility] Patient Name: Dean Alvarado Procedure Date: 08/28/2020 10:24 AM MRN: [id number] Endoscopist: Remo Lipps P. Havery Moros , MD Age: 65 Referring MD:  Date of Birth: 1955/12/07 Gender: Male Account #: [id number] Procedure:                Colonoscopy Indications:              Screening for colorectal malignant neoplasm Medicines:                Monitored Anesthesia Care Procedure:                Pre-Anesthesia Assessment:                           - Prior to the procedure, a History and Physical                            was performed, and patient medications and                            allergies were reviewed. The patient's tolerance of                            previous anesthesia was also reviewed. The risks                            and benefits of the procedure and the sedation                            options and risks were discussed with the patient.                            All questions were answered, and informed consent                            was obtained. Prior Anticoagulants: The patient has                            taken no previous anticoagulant or antiplatelet                            agents. ASA Grade Assessment: III - A patient with                            severe systemic disease. After reviewing the risks                            and benefits, the patient was deemed in                            satisfactory condition to undergo the procedure.                           After obtaining informed consent, the colonoscope  was passed under direct vision. Throughout the                            procedure, the patient's blood pressure, pulse, and                            oxygen saturations were monitored continuously. The                            Olympus CF-HQ190L [id number]) Colonoscope was                            introduced through the anus and advanced to the the                            cecum,  identified by appendiceal orifice and                            ileocecal valve. The colonoscopy was performed                            without difficulty. The patient tolerated the                            procedure well. The quality of the bowel                            preparation was good. The ileocecal valve,                            appendiceal orifice, and rectum were photographed. Scope In: 10:35:49 AM Scope Out: 11:02:35 AM Scope Withdrawal Time: 0 hours 21 minutes 15 seconds  Total Procedure Duration: 0 hours 26 minutes 46 seconds  Findings:                 The perianal and digital rectal examinations were                            normal.                           A 4 to 5 mm polyp was found in the ileocecal valve.                            The polyp was sessile. The polyp was removed with a                            cold snare. Resection and retrieval were complete.                           Two sessile polyps were found in the ascending                            colon. The polyps were 4 to 8 mm  in size. These                            polyps were removed with a cold snare. Resection                            and retrieval were complete.                           Two flat and sessile polyps were found in the                            transverse colon. The polyps were 3 to 4 mm in                            size. These polyps were removed with a cold snare.                            Resection and retrieval were complete.                           Three sessile polyps were found in the                            recto-sigmoid colon / proximal rectum. The polyps                            were 3 to 5 mm in size. These polyps were removed                            with a cold snare. Resection and retrieval were                            complete.                           Multiple small-mouthed diverticula were found in                            the left  colon.                           Internal hemorrhoids were found during                            retroflexion. The hemorrhoids were small.                           The exam was otherwise without abnormality. Complications:            No immediate complications. Estimated blood loss:                            Minimal. Estimated Blood Loss:     Estimated blood loss was minimal. Impression:               -  One 4 to 5 mm polyp at the ileocecal valve,                            removed with a cold snare. Resected and retrieved.                           - Two 4 to 8 mm polyps in the ascending colon,                            removed with a cold snare. Resected and retrieved.                           - Two 3 to 4 mm polyps in the transverse colon,                            removed with a cold snare. Resected and retrieved.                           - Three 3 to 5 mm polyps at the recto-sigmoid colon                            / proximal rectum, removed with a cold snare.                            Resected and retrieved.                           - Diverticulosis in the left colon.                           - Internal hemorrhoids.                           - The examination was otherwise normal. Recommendation:           - Patient has a contact number available for                            emergencies. The signs and symptoms of potential                            delayed complications were discussed with the                            patient. Return to normal activities tomorrow.                            Written discharge instructions were provided to the                            patient.                           - Resume previous diet.                           -  Continue present medications.                           - Await pathology results. Remo Lipps P. Teleah Villamar, MD 08/28/2020 11:11:08 AM This report has been signed electronically.

## 2020-08-28 NOTE — Patient Instructions (Signed)
YOU HAD AN ENDOSCOPIC PROCEDURE TODAY AT THE [facility]:   Refer to the procedure report that was given to you for any specific questions about what was found during the examination.  If the procedure report does not answer your questions, please call your gastroenterologist to clarify.  If you requested that your care partner not be given the details of your procedure findings, then the procedure report has been included in a sealed envelope for you to review at your convenience later.  YOU SHOULD EXPECT: Some feelings of bloating in the abdomen. Passage of more gas than usual.  Walking can help get rid of the air that was put into your GI tract during the procedure and reduce the bloating. If you had a lower endoscopy (such as a colonoscopy or flexible sigmoidoscopy) you may notice spotting of blood in your stool or on the toilet paper. If you underwent a bowel prep for your procedure, you may not have a normal bowel movement for a few days.  Please Note:  You might notice some irritation and congestion in your nose or some drainage.  This is from the oxygen used during your procedure.  There is no need for concern and it should clear up in a day or so.  SYMPTOMS TO REPORT IMMEDIATELY:   Following lower endoscopy (colonoscopy or flexible sigmoidoscopy):  Excessive amounts of blood in the stool  Significant tenderness or worsening of abdominal pains  Swelling of the abdomen that is new, acute  Fever of 100F or higher   Following upper endoscopy (EGD)  Vomiting of blood or coffee ground material  New chest pain or pain under the shoulder blades  Painful or persistently difficult swallowing  New shortness of breath  Fever of 100F or higher  Black, tarry-looking stools  For urgent or emergent issues, a gastroenterologist can be reached at any hour by calling [phone]. Do not use MyChart messaging for urgent concerns.    DIET:  We do recommend a small meal at first, but  then you may proceed to your regular diet.  Drink plenty of fluids but you should avoid alcoholic beverages for 24 hours.  ACTIVITY:  You should plan to take it easy for the rest of today and you should NOT DRIVE or use heavy machinery until tomorrow (because of the sedation medicines used during the test).    FOLLOW UP: Our staff will call the number listed on your records 48-72 hours following your procedure to check on you and address any questions or concerns that you may have regarding the information given to you following your procedure. If we do not reach you, we will leave a message.  We will attempt to reach you two times.  During this call, we will ask if you have developed any symptoms of COVID 19. If you develop any symptoms (ie: fever, flu-like symptoms, shortness of breath, cough etc.) before then, please call [phone].  If you test positive for Covid 19 in the 2 weeks post procedure, please call and report this information to us.    If any biopsies were taken you will be contacted by phone or by letter within the next 1-3 weeks.  Please call us at [phone] if you have not heard about the biopsies in 3 weeks.    SIGNATURES/CONFIDENTIALITY: You and/or your care partner have signed paperwork which will be entered into your electronic medical record.  These signatures attest to the fact that that the information above on   your After Visit Summary has been reviewed and is understood.  Full responsibility of the confidentiality of this discharge information lies with you and/or your care-partner. 

## 2020-08-30 ENCOUNTER — Telehealth: Payer: Self-pay

## 2020-08-30 NOTE — Telephone Encounter (Signed)
  Follow up Call-  Call back number 08/28/2020  Post procedure Call Back phone  # [phone]  Permission to leave phone message Yes  Some recent data might be hidden     Patient questions:  Do you have a fever, pain , or abdominal swelling? No. Pain Score  0 *  Have you tolerated food without any problems? Yes.    Have you been able to return to your normal activities? Yes.    Do you have any questions about your discharge instructions: Diet   No. Medications  No. Follow up visit  No.  Do you have questions or concerns about your Care? No.  Actions: * If pain score is 4 or above: No action needed, pain <4.  1. Have you developed a fever since your procedure? no  2.   Have you had an respiratory symptoms (SOB or cough) since your procedure? no  3.   Have you tested positive for COVID 19 since your procedure no  4.   Have you had any family members/close contacts diagnosed with the COVID 19 since your procedure?  no   If yes to any of these questions please route to Joylene John, RN and Joella Prince, RN

## 2020-08-30 NOTE — Telephone Encounter (Signed)
NO ANSWER, MESSAGE LEFT FOR PATIENT. 

## 2020-11-02 ENCOUNTER — Other Ambulatory Visit: Payer: Self-pay | Admitting: Family Medicine

## 2020-11-07 ENCOUNTER — Other Ambulatory Visit: Payer: Self-pay

## 2020-11-08 ENCOUNTER — Ambulatory Visit (INDEPENDENT_AMBULATORY_CARE_PROVIDER_SITE_OTHER): Payer: Medicare HMO | Admitting: Family Medicine

## 2020-11-08 ENCOUNTER — Encounter: Payer: Self-pay | Admitting: Family Medicine

## 2020-11-08 VITALS — BP 134/90 | HR 85 | Temp 98.5°F | Ht 68.0 in | Wt 213.2 lb

## 2020-11-08 DIAGNOSIS — E1165 Type 2 diabetes mellitus with hyperglycemia: Secondary | ICD-10-CM

## 2020-11-08 DIAGNOSIS — R1031 Right lower quadrant pain: Secondary | ICD-10-CM

## 2020-11-08 LAB — CBC WITH DIFFERENTIAL/PLATELET
Basophils Absolute: 0.1 10*3/uL (ref 0.0–0.1)
Basophils Relative: 1 % (ref 0.0–3.0)
Eosinophils Absolute: 0.2 10*3/uL (ref 0.0–0.7)
Eosinophils Relative: 2.7 % (ref 0.0–5.0)
HCT: 40.1 % (ref 39.0–52.0)
Hemoglobin: 14.2 g/dL (ref 13.0–17.0)
Lymphocytes Relative: 24.6 % (ref 12.0–46.0)
Lymphs Abs: 1.4 10*3/uL (ref 0.7–4.0)
MCHC: 35.3 g/dL (ref 30.0–36.0)
MCV: 90.5 fl (ref 78.0–100.0)
Monocytes Absolute: 0.8 10*3/uL (ref 0.1–1.0)
Monocytes Relative: 13.4 % — ABNORMAL HIGH (ref 3.0–12.0)
Neutro Abs: 3.3 10*3/uL (ref 1.4–7.7)
Neutrophils Relative %: 58.3 % (ref 43.0–77.0)
Platelets: 285 10*3/uL (ref 150.0–400.0)
RBC: 4.44 Mil/uL (ref 4.22–5.81)
RDW: 13.5 % (ref 11.5–15.5)
WBC: 5.7 10*3/uL (ref 4.0–10.5)

## 2020-11-08 LAB — BASIC METABOLIC PANEL
BUN: 13 mg/dL (ref 6–23)
CO2: 25 mEq/L (ref 19–32)
Calcium: 9.4 mg/dL (ref 8.4–10.5)
Chloride: 100 mEq/L (ref 96–112)
Creatinine, Ser: 0.91 mg/dL (ref 0.40–1.50)
GFR: 88.37 mL/min (ref >60.00)
Glucose, Bld: 137 mg/dL — ABNORMAL HIGH (ref 70–99)
Potassium: 4.1 mEq/L (ref 3.5–5.1)
Sodium: 135 mEq/L (ref 135–145)

## 2020-11-08 LAB — HEMOGLOBIN A1C: Hgb A1c MFr Bld: 7 % — ABNORMAL HIGH (ref 4.6–6.5)

## 2020-11-08 NOTE — Progress Notes (Signed)
   Subjective:    Patient ID: Dean Alvarado, male    DOB: 01-Mar-1956, 65 y.o.   MRN: [id number]  HPI Here for 10 days of an intermittent sharp pain in the right side of the abdomen. This started in the right middle back and right flank, and now it has moved to the RLQ. He feels it mostly when he stands up or sits down. Lying flat or walking is not painful. He denies any nausea or fever. Appetite is intact. BMs are normal. No urinary symptoms. No recent trauma. His UA today is clear.    Review of Systems  Constitutional: Negative.   Respiratory: Negative.    Cardiovascular: Negative.   Gastrointestinal:  Positive for abdominal pain. Negative for abdominal distention, anal bleeding, blood in stool, constipation, diarrhea, nausea, rectal pain and vomiting.  Genitourinary:  Positive for flank pain. Negative for difficulty urinating, dysuria, frequency, hematuria and urgency.      Objective:   Physical Exam Constitutional:      Appearance: Normal appearance. He is not ill-appearing.  Cardiovascular:     Rate and Rhythm: Normal rate and regular rhythm.     Pulses: Normal pulses.     Heart sounds: Normal heart sounds.  Pulmonary:     Effort: Pulmonary effort is normal.     Breath sounds: Normal breath sounds.  Abdominal:     General: Abdomen is flat. Bowel sounds are normal. There is no distension.     Palpations: Abdomen is soft. There is no mass.     Tenderness: There is no right CVA tenderness, left CVA tenderness, guarding or rebound.     Hernia: No hernia is present.     Comments: Moderately tender in the RLQ and right flank   Neurological:     Mental Status: He is alert.          Assessment & Plan:  RLQ pain, the etiology is likely either muscular or a kidney stone or his appendix. We will get labs today and set up a contrasted CT of abdomen and pelvis asap. We spent 35 minutes discussing these issues.  Alysia Penna, MD

## 2020-11-10 ENCOUNTER — Ambulatory Visit (INDEPENDENT_AMBULATORY_CARE_PROVIDER_SITE_OTHER)
Admission: RE | Admit: 2020-11-10 | Discharge: 2020-11-10 | Disposition: A | Discharge status: Home / Self Care | Payer: Medicare HMO | Source: Ambulatory Visit | Attending: Family Medicine | Admitting: Family Medicine

## 2020-11-10 ENCOUNTER — Other Ambulatory Visit: Payer: Self-pay

## 2020-11-10 DIAGNOSIS — R1031 Right lower quadrant pain: Secondary | ICD-10-CM

## 2020-11-10 MED ORDER — IOHEXOL 300 MG/ML  SOLN
100.0000 mL | Freq: Once | INTRAMUSCULAR | Status: AC | PRN
  Administered 2020-11-10: 100 mL via INTRAVENOUS

## 2020-11-30 ENCOUNTER — Telehealth: Payer: Self-pay

## 2020-12-01 ENCOUNTER — Other Ambulatory Visit: Payer: Self-pay

## 2020-12-01 MED ORDER — METHYLPREDNISOLONE 4 MG PO TBPK: ORAL_TABLET | ORAL | 0 refills | Status: DC

## 2020-12-01 NOTE — Telephone Encounter (Signed)
Spoke with pt advised of Dr Sarajane Jews recommendation, Rx for Medrol Dose  pack sent to pt pharmacy

## 2020-12-01 NOTE — Telephone Encounter (Signed)
No need to see Urology because the CT ruled out kidney stones. This pain is likely due to a pinched nerve in his back. Call in a Medrol 4 mg dose pack (take as directed). If this does not work, he may need to come in for another exam

## 2021-02-03 ENCOUNTER — Other Ambulatory Visit: Payer: Self-pay | Admitting: Family Medicine

## 2021-02-19 ENCOUNTER — Emergency Department (HOSPITAL_COMMUNITY): Payer: Medicare HMO

## 2021-02-19 ENCOUNTER — Inpatient Hospital Stay (HOSPITAL_COMMUNITY): Payer: Medicare HMO

## 2021-02-19 ENCOUNTER — Encounter (HOSPITAL_COMMUNITY): Payer: Self-pay | Admitting: Emergency Medicine

## 2021-02-19 ENCOUNTER — Inpatient Hospital Stay (HOSPITAL_COMMUNITY)
Admission: EM | Admit: 2021-02-19 | Discharge: 2021-03-08 | DRG: 981 | Disposition: A | Discharge status: Skilled Nursing Facility | Payer: Medicare HMO | Attending: General Surgery | Admitting: General Surgery

## 2021-02-19 ENCOUNTER — Inpatient Hospital Stay (HOSPITAL_COMMUNITY): Payer: Medicare HMO | Admitting: Anesthesiology

## 2021-02-19 ENCOUNTER — Encounter (HOSPITAL_COMMUNITY): Admission: EM | Disposition: A | Discharge status: Skilled Nursing Facility | Payer: Self-pay | Source: Home / Self Care

## 2021-02-19 DIAGNOSIS — S27322A Contusion of lung, bilateral, initial encounter: Secondary | ICD-10-CM | POA: Diagnosis not present

## 2021-02-19 DIAGNOSIS — K760 Fatty (change of) liver, not elsewhere classified: Secondary | ICD-10-CM | POA: Diagnosis not present

## 2021-02-19 DIAGNOSIS — R443 Hallucinations, unspecified: Secondary | ICD-10-CM | POA: Diagnosis not present

## 2021-02-19 DIAGNOSIS — S22080A Wedge compression fracture of T11-T12 vertebra, initial encounter for closed fracture: Secondary | ICD-10-CM | POA: Diagnosis not present

## 2021-02-19 DIAGNOSIS — I808 Phlebitis and thrombophlebitis of other sites: Secondary | ICD-10-CM | POA: Diagnosis present

## 2021-02-19 DIAGNOSIS — Z7984 Long term (current) use of oral hypoglycemic drugs: Secondary | ICD-10-CM | POA: Diagnosis not present

## 2021-02-19 DIAGNOSIS — J9690 Respiratory failure, unspecified, unspecified whether with hypoxia or hypercapnia: Secondary | ICD-10-CM

## 2021-02-19 DIAGNOSIS — W19XXXA Unspecified fall, initial encounter: Secondary | ICD-10-CM

## 2021-02-19 DIAGNOSIS — K828 Other specified diseases of gallbladder: Secondary | ICD-10-CM | POA: Diagnosis not present

## 2021-02-19 DIAGNOSIS — K5730 Diverticulosis of large intestine without perforation or abscess without bleeding: Secondary | ICD-10-CM | POA: Diagnosis not present

## 2021-02-19 DIAGNOSIS — R4781 Slurred speech: Secondary | ICD-10-CM | POA: Diagnosis not present

## 2021-02-19 DIAGNOSIS — N179 Acute kidney failure, unspecified: Secondary | ICD-10-CM | POA: Diagnosis present

## 2021-02-19 DIAGNOSIS — S22089D Unspecified fracture of T11-T12 vertebra, subsequent encounter for fracture with routine healing: Secondary | ICD-10-CM | POA: Diagnosis not present

## 2021-02-19 DIAGNOSIS — J9601 Acute respiratory failure with hypoxia: Secondary | ICD-10-CM | POA: Diagnosis not present

## 2021-02-19 DIAGNOSIS — Z20822 Contact with and (suspected) exposure to covid-19: Secondary | ICD-10-CM | POA: Diagnosis present

## 2021-02-19 DIAGNOSIS — R0689 Other abnormalities of breathing: Secondary | ICD-10-CM | POA: Diagnosis not present

## 2021-02-19 DIAGNOSIS — Z8673 Personal history of transient ischemic attack (TIA), and cerebral infarction without residual deficits: Secondary | ICD-10-CM | POA: Diagnosis not present

## 2021-02-19 DIAGNOSIS — S2243XA Multiple fractures of ribs, bilateral, initial encounter for closed fracture: Secondary | ICD-10-CM

## 2021-02-19 DIAGNOSIS — S270XXA Traumatic pneumothorax, initial encounter: Secondary | ICD-10-CM | POA: Diagnosis not present

## 2021-02-19 DIAGNOSIS — S32018A Other fracture of first lumbar vertebra, initial encounter for closed fracture: Secondary | ICD-10-CM

## 2021-02-19 DIAGNOSIS — J982 Interstitial emphysema: Secondary | ICD-10-CM | POA: Diagnosis present

## 2021-02-19 DIAGNOSIS — Y929 Unspecified place or not applicable: Secondary | ICD-10-CM | POA: Diagnosis not present

## 2021-02-19 DIAGNOSIS — J90 Pleural effusion, not elsewhere classified: Secondary | ICD-10-CM | POA: Diagnosis not present

## 2021-02-19 DIAGNOSIS — Y900 Blood alcohol level of less than 20 mg/100 ml: Secondary | ICD-10-CM | POA: Diagnosis present

## 2021-02-19 DIAGNOSIS — Z79899 Other long term (current) drug therapy: Secondary | ICD-10-CM

## 2021-02-19 DIAGNOSIS — J939 Pneumothorax, unspecified: Secondary | ICD-10-CM | POA: Diagnosis not present

## 2021-02-19 DIAGNOSIS — Z87891 Personal history of nicotine dependence: Secondary | ICD-10-CM

## 2021-02-19 DIAGNOSIS — K567 Ileus, unspecified: Secondary | ICD-10-CM | POA: Diagnosis present

## 2021-02-19 DIAGNOSIS — S32029A Unspecified fracture of second lumbar vertebra, initial encounter for closed fracture: Secondary | ICD-10-CM

## 2021-02-19 DIAGNOSIS — E1165 Type 2 diabetes mellitus with hyperglycemia: Secondary | ICD-10-CM | POA: Diagnosis not present

## 2021-02-19 DIAGNOSIS — S32030A Wedge compression fracture of third lumbar vertebra, initial encounter for closed fracture: Secondary | ICD-10-CM | POA: Diagnosis not present

## 2021-02-19 DIAGNOSIS — F05 Delirium due to known physiological condition: Secondary | ICD-10-CM | POA: Diagnosis not present

## 2021-02-19 DIAGNOSIS — S22089A Unspecified fracture of T11-T12 vertebra, initial encounter for closed fracture: Secondary | ICD-10-CM | POA: Diagnosis not present

## 2021-02-19 DIAGNOSIS — W109XXA Fall (on) (from) unspecified stairs and steps, initial encounter: Secondary | ICD-10-CM | POA: Diagnosis present

## 2021-02-19 DIAGNOSIS — Z833 Family history of diabetes mellitus: Secondary | ICD-10-CM | POA: Diagnosis not present

## 2021-02-19 DIAGNOSIS — I10 Essential (primary) hypertension: Secondary | ICD-10-CM | POA: Diagnosis not present

## 2021-02-19 DIAGNOSIS — S2241XA Multiple fractures of ribs, right side, initial encounter for closed fracture: Secondary | ICD-10-CM | POA: Diagnosis not present

## 2021-02-19 DIAGNOSIS — M50322 Other cervical disc degeneration at C5-C6 level: Secondary | ICD-10-CM | POA: Diagnosis not present

## 2021-02-19 DIAGNOSIS — S3991XA Unspecified injury of abdomen, initial encounter: Secondary | ICD-10-CM | POA: Diagnosis not present

## 2021-02-19 DIAGNOSIS — M4325 Fusion of spine, thoracolumbar region: Secondary | ICD-10-CM | POA: Diagnosis not present

## 2021-02-19 DIAGNOSIS — R0602 Shortness of breath: Secondary | ICD-10-CM

## 2021-02-19 DIAGNOSIS — R0600 Dyspnea, unspecified: Secondary | ICD-10-CM | POA: Diagnosis not present

## 2021-02-19 DIAGNOSIS — E785 Hyperlipidemia, unspecified: Secondary | ICD-10-CM | POA: Diagnosis present

## 2021-02-19 DIAGNOSIS — M2550 Pain in unspecified joint: Secondary | ICD-10-CM | POA: Diagnosis not present

## 2021-02-19 DIAGNOSIS — R0902 Hypoxemia: Secondary | ICD-10-CM | POA: Diagnosis not present

## 2021-02-19 DIAGNOSIS — R410 Disorientation, unspecified: Secondary | ICD-10-CM | POA: Diagnosis not present

## 2021-02-19 DIAGNOSIS — R000 Tachycardia, unspecified: Secondary | ICD-10-CM | POA: Diagnosis not present

## 2021-02-19 DIAGNOSIS — R1011 Right upper quadrant pain: Secondary | ICD-10-CM | POA: Diagnosis not present

## 2021-02-19 DIAGNOSIS — Z7982 Long term (current) use of aspirin: Secondary | ICD-10-CM

## 2021-02-19 DIAGNOSIS — I517 Cardiomegaly: Secondary | ICD-10-CM | POA: Diagnosis not present

## 2021-02-19 DIAGNOSIS — D62 Acute posthemorrhagic anemia: Secondary | ICD-10-CM | POA: Diagnosis not present

## 2021-02-19 DIAGNOSIS — S22088A Other fracture of T11-T12 vertebra, initial encounter for closed fracture: Secondary | ICD-10-CM | POA: Diagnosis not present

## 2021-02-19 DIAGNOSIS — R140 Abdominal distension (gaseous): Secondary | ICD-10-CM

## 2021-02-19 DIAGNOSIS — Z7401 Bed confinement status: Secondary | ICD-10-CM | POA: Diagnosis not present

## 2021-02-19 DIAGNOSIS — F10239 Alcohol dependence with withdrawal, unspecified: Secondary | ICD-10-CM | POA: Diagnosis not present

## 2021-02-19 DIAGNOSIS — S2243XD Multiple fractures of ribs, bilateral, subsequent encounter for fracture with routine healing: Secondary | ICD-10-CM | POA: Diagnosis not present

## 2021-02-19 DIAGNOSIS — R571 Hypovolemic shock: Secondary | ICD-10-CM | POA: Diagnosis not present

## 2021-02-19 DIAGNOSIS — R404 Transient alteration of awareness: Secondary | ICD-10-CM | POA: Diagnosis not present

## 2021-02-19 DIAGNOSIS — R45851 Suicidal ideations: Secondary | ICD-10-CM | POA: Diagnosis not present

## 2021-02-19 DIAGNOSIS — S2231XA Fracture of one rib, right side, initial encounter for closed fracture: Secondary | ICD-10-CM | POA: Diagnosis not present

## 2021-02-19 DIAGNOSIS — F10188 Alcohol abuse with other alcohol-induced disorder: Secondary | ICD-10-CM | POA: Diagnosis not present

## 2021-02-19 DIAGNOSIS — Z8249 Family history of ischemic heart disease and other diseases of the circulatory system: Secondary | ICD-10-CM | POA: Diagnosis not present

## 2021-02-19 DIAGNOSIS — Z419 Encounter for procedure for purposes other than remedying health state, unspecified: Secondary | ICD-10-CM

## 2021-02-19 DIAGNOSIS — S0990XA Unspecified injury of head, initial encounter: Secondary | ICD-10-CM | POA: Diagnosis not present

## 2021-02-19 DIAGNOSIS — S2221XA Fracture of manubrium, initial encounter for closed fracture: Secondary | ICD-10-CM | POA: Diagnosis not present

## 2021-02-19 DIAGNOSIS — R079 Chest pain, unspecified: Secondary | ICD-10-CM | POA: Diagnosis not present

## 2021-02-19 DIAGNOSIS — S2249XA Multiple fractures of ribs, unspecified side, initial encounter for closed fracture: Secondary | ICD-10-CM | POA: Diagnosis present

## 2021-02-19 DIAGNOSIS — M50321 Other cervical disc degeneration at C4-C5 level: Secondary | ICD-10-CM | POA: Diagnosis not present

## 2021-02-19 DIAGNOSIS — M6281 Muscle weakness (generalized): Secondary | ICD-10-CM | POA: Diagnosis not present

## 2021-02-19 DIAGNOSIS — S199XXA Unspecified injury of neck, initial encounter: Secondary | ICD-10-CM | POA: Diagnosis not present

## 2021-02-19 DIAGNOSIS — J439 Emphysema, unspecified: Secondary | ICD-10-CM | POA: Diagnosis not present

## 2021-02-19 DIAGNOSIS — S32020A Wedge compression fracture of second lumbar vertebra, initial encounter for closed fracture: Secondary | ICD-10-CM | POA: Diagnosis not present

## 2021-02-19 DIAGNOSIS — S32019A Unspecified fracture of first lumbar vertebra, initial encounter for closed fracture: Secondary | ICD-10-CM | POA: Diagnosis not present

## 2021-02-19 DIAGNOSIS — R2689 Other abnormalities of gait and mobility: Secondary | ICD-10-CM | POA: Diagnosis not present

## 2021-02-19 DIAGNOSIS — Z981 Arthrodesis status: Secondary | ICD-10-CM | POA: Diagnosis not present

## 2021-02-19 DIAGNOSIS — K6389 Other specified diseases of intestine: Secondary | ICD-10-CM | POA: Diagnosis not present

## 2021-02-19 DIAGNOSIS — Z9889 Other specified postprocedural states: Secondary | ICD-10-CM

## 2021-02-19 DIAGNOSIS — R262 Difficulty in walking, not elsewhere classified: Secondary | ICD-10-CM | POA: Diagnosis not present

## 2021-02-19 DIAGNOSIS — R109 Unspecified abdominal pain: Secondary | ICD-10-CM

## 2021-02-19 DIAGNOSIS — J9811 Atelectasis: Secondary | ICD-10-CM | POA: Diagnosis not present

## 2021-02-19 DIAGNOSIS — R231 Pallor: Secondary | ICD-10-CM | POA: Diagnosis not present

## 2021-02-19 HISTORY — PX: ANTERIOR AND POSTERIOR SPINAL FUSION: SHX2259

## 2021-02-19 LAB — I-STAT CHEM 8, ED
BUN: 23 mg/dL (ref 8–23)
Calcium, Ion: 1.05 mmol/L — ABNORMAL LOW (ref 1.15–1.40)
Chloride: 99 mmol/L (ref 98–111)
Creatinine, Ser: 1.1 mg/dL (ref 0.61–1.24)
Glucose, Bld: 204 mg/dL — ABNORMAL HIGH (ref 70–99)
HCT: 37 % — ABNORMAL LOW (ref 39.0–52.0)
Hemoglobin: 12.6 g/dL — ABNORMAL LOW (ref 13.0–17.0)
Potassium: 4.8 mmol/L (ref 3.5–5.1)
Sodium: 132 mmol/L — ABNORMAL LOW (ref 135–145)
TCO2: 22 mmol/L (ref 22–32)

## 2021-02-19 LAB — CBC
HCT: 36.9 % — ABNORMAL LOW (ref 39.0–52.0)
Hemoglobin: 12.5 g/dL — ABNORMAL LOW (ref 13.0–17.0)
MCH: 31.5 pg (ref 26.0–34.0)
MCHC: 33.9 g/dL (ref 30.0–36.0)
MCV: 92.9 fL (ref 80.0–100.0)
Platelets: 262 10*3/uL (ref 150–400)
RBC: 3.97 MIL/uL — ABNORMAL LOW (ref 4.22–5.81)
RDW: 12.5 % (ref 11.5–15.5)
WBC: 16.6 10*3/uL — ABNORMAL HIGH (ref 4.0–10.5)
nRBC: 0 % (ref 0.0–0.2)

## 2021-02-19 LAB — GLUCOSE, CAPILLARY: Glucose-Capillary: 240 mg/dL — ABNORMAL HIGH (ref 70–99)

## 2021-02-19 LAB — RESP PANEL BY RT-PCR (FLU A&B, COVID) ARPGX2
Influenza A by PCR: NEGATIVE
Influenza B by PCR: NEGATIVE
SARS Coronavirus 2 by RT PCR: NEGATIVE

## 2021-02-19 LAB — SURGICAL PCR SCREEN
MRSA, PCR: NEGATIVE
Staphylococcus aureus: NEGATIVE

## 2021-02-19 LAB — LACTIC ACID, PLASMA: Lactic Acid, Venous: 2.8 mmol/L (ref 0.5–1.9)

## 2021-02-19 LAB — ETHANOL: Alcohol, Ethyl (B): <10 mg/dL (ref <10)

## 2021-02-19 LAB — CBG MONITORING, ED: Glucose-Capillary: 169 mg/dL — ABNORMAL HIGH (ref 70–99)

## 2021-02-19 LAB — PREPARE RBC (CROSSMATCH)

## 2021-02-19 SURGERY — ANTERIOR AND POSTERIOR SPINAL FUSION
Anesthesia: General | Site: Back

## 2021-02-19 MED ORDER — PROPOFOL 10 MG/ML IV BOLUS
INTRAVENOUS | Status: DC | PRN
  Administered 2021-02-19: 20 mg via INTRAVENOUS
  Administered 2021-02-19: 150 mg via INTRAVENOUS

## 2021-02-19 MED ORDER — ACETAMINOPHEN 325 MG PO TABS: 325.0000 mg | ORAL_TABLET | Freq: Once | ORAL | Status: DC | PRN

## 2021-02-19 MED ORDER — PHENYLEPHRINE HCL (PRESSORS) 10 MG/ML IV SOLN
INTRAVENOUS | Status: DC | PRN
  Administered 2021-02-19 (×2): 200 ug via INTRAVENOUS

## 2021-02-19 MED ORDER — LACTATED RINGERS IV SOLN: INTRAVENOUS | Status: DC

## 2021-02-19 MED ORDER — ALBUMIN HUMAN 5 % IV SOLN: INTRAVENOUS | Status: DC | PRN

## 2021-02-19 MED ORDER — HYDROMORPHONE HCL 1 MG/ML IJ SOLN
0.2500 mg | INTRAMUSCULAR | Status: DC | PRN
Start: 2021-02-19 — End: 2021-02-20

## 2021-02-19 MED ORDER — ORAL CARE MOUTH RINSE: 15.0000 mL | Freq: Once | OROMUCOSAL | Status: DC

## 2021-02-19 MED ORDER — PROPOFOL 10 MG/ML IV BOLUS
INTRAVENOUS | Status: AC
  Filled 2021-02-19: qty 20

## 2021-02-19 MED ORDER — LIDOCAINE-EPINEPHRINE 1 %-1:100000 IJ SOLN
INTRAMUSCULAR | Status: DC | PRN
  Administered 2021-02-19: 5 mL via INTRADERMAL

## 2021-02-19 MED ORDER — CEFAZOLIN SODIUM-DEXTROSE 2-4 GM/100ML-% IV SOLN
2.0000 g | INTRAVENOUS | Status: AC
  Administered 2021-02-19: 2 g via INTRAVENOUS

## 2021-02-19 MED ORDER — FENTANYL CITRATE (PF) 250 MCG/5ML IJ SOLN
INTRAMUSCULAR | Status: AC
  Filled 2021-02-19: qty 5

## 2021-02-19 MED ORDER — SUGAMMADEX SODIUM 200 MG/2ML IV SOLN
INTRAVENOUS | Status: DC | PRN
  Administered 2021-02-19: 200 mg via INTRAVENOUS
  Administered 2021-02-19: 100 mg via INTRAVENOUS

## 2021-02-19 MED ORDER — ROCURONIUM BROMIDE 10 MG/ML (PF) SYRINGE
PREFILLED_SYRINGE | INTRAVENOUS | Status: DC | PRN
  Administered 2021-02-19: 50 mg via INTRAVENOUS
  Administered 2021-02-19: 60 mg via INTRAVENOUS

## 2021-02-19 MED ORDER — PHENYLEPHRINE HCL-NACL 20-0.9 MG/250ML-% IV SOLN
INTRAVENOUS | Status: DC | PRN
Start: 2021-02-19 — End: 2021-02-19
  Administered 2021-02-19: 100 ug/min via INTRAVENOUS
  Administered 2021-02-19: 50 ug/min via INTRAVENOUS

## 2021-02-19 MED ORDER — MIDAZOLAM HCL 2 MG/2ML IJ SOLN
INTRAMUSCULAR | Status: AC
  Administered 2021-02-19 – 2021-02-20 (×2): 0.5 mg
  Filled 2021-02-19: qty 2

## 2021-02-19 MED ORDER — BUPIVACAINE HCL (PF) 0.5 % IJ SOLN
INTRAMUSCULAR | Status: AC
  Filled 2021-02-19: qty 30

## 2021-02-19 MED ORDER — PROMETHAZINE HCL 25 MG/ML IJ SOLN: 6.2500 mg | INTRAMUSCULAR | Status: DC | PRN

## 2021-02-19 MED ORDER — THROMBIN 5000 UNITS EX SOLR
CUTANEOUS | Status: AC
  Filled 2021-02-19: qty 5000

## 2021-02-19 MED ORDER — IOHEXOL 350 MG/ML SOLN
75.0000 mL | Freq: Once | INTRAVENOUS | Status: AC | PRN
  Administered 2021-02-19: 75 mL via INTRAVENOUS

## 2021-02-19 MED ORDER — LACTATED RINGERS IV SOLN: INTRAVENOUS | Status: DC | PRN

## 2021-02-19 MED ORDER — SUCCINYLCHOLINE CHLORIDE 200 MG/10ML IV SOSY
PREFILLED_SYRINGE | INTRAVENOUS | Status: DC | PRN
  Administered 2021-02-19: 100 mg via INTRAVENOUS

## 2021-02-19 MED ORDER — DEXAMETHASONE SODIUM PHOSPHATE 10 MG/ML IJ SOLN
INTRAMUSCULAR | Status: DC | PRN
  Administered 2021-02-19: 10 mg via INTRAVENOUS

## 2021-02-19 MED ORDER — LIDOCAINE-EPINEPHRINE 1 %-1:100000 IJ SOLN
INTRAMUSCULAR | Status: AC
  Filled 2021-02-19: qty 1

## 2021-02-19 MED ORDER — ACETAMINOPHEN 10 MG/ML IV SOLN: 1000.0000 mg | Freq: Once | INTRAVENOUS | Status: DC | PRN

## 2021-02-19 MED ORDER — 0.9 % SODIUM CHLORIDE (POUR BTL) OPTIME
TOPICAL | Status: DC | PRN
  Administered 2021-02-19: 1000 mL

## 2021-02-19 MED ORDER — MIDAZOLAM HCL 5 MG/5ML IJ SOLN
INTRAMUSCULAR | Status: DC | PRN
  Administered 2021-02-19: 2 mg via INTRAVENOUS

## 2021-02-19 MED ORDER — HYDROMORPHONE HCL 1 MG/ML IJ SOLN
1.0000 mg | Freq: Once | INTRAMUSCULAR | Status: AC
Start: 2021-02-19 — End: 2021-02-19
  Administered 2021-02-19: 1 mg via INTRAVENOUS
  Filled 2021-02-19: qty 1

## 2021-02-19 MED ORDER — THROMBIN 5000 UNITS EX SOLR
OROMUCOSAL | Status: DC | PRN
  Administered 2021-02-19: 5 mL via TOPICAL

## 2021-02-19 MED ORDER — BUPIVACAINE HCL (PF) 0.5 % IJ SOLN
INTRAMUSCULAR | Status: DC | PRN
  Administered 2021-02-19: 5 mL

## 2021-02-19 MED ORDER — CHLORHEXIDINE GLUCONATE 0.12 % MT SOLN
OROMUCOSAL | Status: AC
  Filled 2021-02-19: qty 15

## 2021-02-19 MED ORDER — CHLORHEXIDINE GLUCONATE 0.12 % MT SOLN: 15.0000 mL | Freq: Once | OROMUCOSAL | Status: DC

## 2021-02-19 MED ORDER — ACETAMINOPHEN 160 MG/5ML PO SOLN
325.0000 mg | Freq: Once | ORAL | Status: DC | PRN
Start: 2021-02-19 — End: 2021-02-20

## 2021-02-19 MED ORDER — ONDANSETRON HCL 4 MG/2ML IJ SOLN
INTRAMUSCULAR | Status: DC | PRN
  Administered 2021-02-19: 4 mg via INTRAVENOUS

## 2021-02-19 MED ORDER — AMISULPRIDE (ANTIEMETIC) 5 MG/2ML IV SOLN: 10.0000 mg | Freq: Once | INTRAVENOUS | Status: DC | PRN

## 2021-02-19 MED ORDER — FENTANYL CITRATE (PF) 100 MCG/2ML IJ SOLN
INTRAMUSCULAR | Status: DC | PRN
  Administered 2021-02-19: 100 ug via INTRAVENOUS
  Administered 2021-02-19 (×2): 50 ug via INTRAVENOUS

## 2021-02-19 MED ORDER — CEFAZOLIN SODIUM-DEXTROSE 2-4 GM/100ML-% IV SOLN
INTRAVENOUS | Status: AC
  Filled 2021-02-19: qty 100

## 2021-02-19 MED ORDER — LIDOCAINE 2% (20 MG/ML) 5 ML SYRINGE
INTRAMUSCULAR | Status: DC | PRN
  Administered 2021-02-19: 30 mg via INTRAVENOUS

## 2021-02-19 MED ORDER — MIDAZOLAM HCL 2 MG/2ML IJ SOLN
1.0000 mg | Freq: Once | INTRAMUSCULAR | Status: AC
  Administered 2021-02-19 – 2021-02-20 (×2): 0.5 mg via INTRAVENOUS

## 2021-02-19 MED ORDER — MIDAZOLAM HCL 2 MG/2ML IJ SOLN
INTRAMUSCULAR | Status: AC
  Filled 2021-02-19: qty 2

## 2021-02-19 MED ORDER — FENTANYL CITRATE PF 50 MCG/ML IJ SOSY
50.0000 ug | PREFILLED_SYRINGE | Freq: Once | INTRAMUSCULAR | Status: AC
  Administered 2021-02-19: 50 ug via INTRAVENOUS
  Filled 2021-02-19: qty 1

## 2021-02-19 MED ORDER — MEPERIDINE HCL 25 MG/ML IJ SOLN: 6.2500 mg | INTRAMUSCULAR | Status: DC | PRN

## 2021-02-19 SURGICAL SUPPLY — 72 items
ADH SKN CLS APL DERMABOND .7 (GAUZE/BANDAGES/DRESSINGS) ×2
APL SKNCLS STERI-STRIP NONHPOA (GAUZE/BANDAGES/DRESSINGS)
BAG COUNTER SPONGE SURGICOUNT (BAG) ×2 IMPLANT
BAG SPNG CNTER NS LX DISP (BAG) ×1
BAG SURGICOUNT SPONGE COUNTING (BAG) ×1
BASKET BONE COLLECTION (BASKET) ×3 IMPLANT
BENZOIN TINCTURE PRP APPL 2/3 (GAUZE/BANDAGES/DRESSINGS) IMPLANT
BLADE CLIPPER SURG (BLADE) IMPLANT
BLADE SURG 11 STRL SS (BLADE) ×3 IMPLANT
BUR MATCHSTICK NEURO 3.0 LAGG (BURR) ×3 IMPLANT
BUR PRECISION FLUTE 5.0 (BURR) ×3 IMPLANT
CANISTER SUCT 3000ML PPV (MISCELLANEOUS) ×3 IMPLANT
CLOSURE WOUND 1/2 X4 (GAUZE/BANDAGES/DRESSINGS)
CNTNR URN SCR LID CUP LEK RST (MISCELLANEOUS) ×1 IMPLANT
CONT SPEC 4OZ STRL OR WHT (MISCELLANEOUS) ×3
COVER BACK TABLE 60X90IN (DRAPES) ×3 IMPLANT
DECANTER SPIKE VIAL GLASS SM (MISCELLANEOUS) ×3 IMPLANT
DERMABOND ADVANCED (GAUZE/BANDAGES/DRESSINGS) ×4
DERMABOND ADVANCED .7 DNX12 (GAUZE/BANDAGES/DRESSINGS) ×1 IMPLANT
DRAPE C-ARM 42X72 X-RAY (DRAPES) ×4 IMPLANT
DRAPE C-ARMOR (DRAPES) ×2 IMPLANT
DRAPE LAPAROTOMY 100X72X124 (DRAPES) ×3 IMPLANT
DRAPE SURG 17X23 STRL (DRAPES) ×3 IMPLANT
DURAPREP 26ML APPLICATOR (WOUND CARE) ×3 IMPLANT
ELECT REM PT RETURN 9FT ADLT (ELECTROSURGICAL) ×3
ELECTRODE REM PT RTRN 9FT ADLT (ELECTROSURGICAL) ×1 IMPLANT
GAUZE 4X4 16PLY ~~LOC~~+RFID DBL (SPONGE) ×4 IMPLANT
GAUZE SPONGE 4X4 12PLY STRL (GAUZE/BANDAGES/DRESSINGS) IMPLANT
GLOVE EXAM NITRILE LRG STRL (GLOVE) IMPLANT
GLOVE EXAM NITRILE XL STR (GLOVE) IMPLANT
GLOVE EXAM NITRILE XS STR PU (GLOVE) IMPLANT
GLOVE SURG LTX SZ7.5 (GLOVE) ×6 IMPLANT
GLOVE SURG UNDER POLY LF SZ7.5 (GLOVE) ×6 IMPLANT
GOWN STRL REUS W/ TWL LRG LVL3 (GOWN DISPOSABLE) ×4 IMPLANT
GOWN STRL REUS W/ TWL XL LVL3 (GOWN DISPOSABLE) IMPLANT
GOWN STRL REUS W/TWL 2XL LVL3 (GOWN DISPOSABLE) IMPLANT
GOWN STRL REUS W/TWL LRG LVL3 (GOWN DISPOSABLE) ×12
GOWN STRL REUS W/TWL XL LVL3 (GOWN DISPOSABLE)
GRAFT BN 10X1XDBM MAGNIFUSE (Bone Implant) IMPLANT
GRAFT BONE MAGNIFUSE 1X10CM (Bone Implant) ×3 IMPLANT
HEMOSTAT POWDER KIT SURGIFOAM (HEMOSTASIS) ×3 IMPLANT
KIT BASIN OR (CUSTOM PROCEDURE TRAY) ×3 IMPLANT
KIT POSITION SURG JACKSON T1 (MISCELLANEOUS) ×3 IMPLANT
KIT TURNOVER KIT B (KITS) ×3 IMPLANT
MILL MEDIUM DISP (BLADE) ×3 IMPLANT
NDL HYPO 18GX1.5 BLUNT FILL (NEEDLE) IMPLANT
NDL SPNL 18GX3.5 QUINCKE PK (NEEDLE) IMPLANT
NEEDLE HYPO 18GX1.5 BLUNT FILL (NEEDLE) IMPLANT
NEEDLE HYPO 22GX1.5 SAFETY (NEEDLE) ×3 IMPLANT
NEEDLE SPNL 18GX3.5 QUINCKE PK (NEEDLE) IMPLANT
NS IRRIG 1000ML POUR BTL (IV SOLUTION) ×3 IMPLANT
PACK LAMINECTOMY NEURO (CUSTOM PROCEDURE TRAY) ×3 IMPLANT
PAD ARMBOARD 7.5X6 YLW CONV (MISCELLANEOUS) ×9 IMPLANT
ROD 5.5MM SPINAL SOLERA (Rod) ×2 IMPLANT
SCREW SET SOLERA (Screw) ×30 IMPLANT
SCREW SET SOLERA TI5.5 (Screw) IMPLANT
SCREW SOLERA 6.5X35MM (Screw) ×12 IMPLANT
SCREW SPINAL 4.5X35MM COBALT (Screw) ×8 IMPLANT
SPONGE SURGIFOAM ABS GEL 100 (HEMOSTASIS) IMPLANT
SPONGE T-LAP 4X18 ~~LOC~~+RFID (SPONGE) ×2 IMPLANT
STRIP CLOSURE SKIN 1/2X4 (GAUZE/BANDAGES/DRESSINGS) IMPLANT
SUT MNCRL AB 3-0 PS2 18 (SUTURE) ×3 IMPLANT
SUT MON AB 3-0 SH 27 (SUTURE) ×3
SUT MON AB 3-0 SH27 (SUTURE) IMPLANT
SUT VIC AB 0 CT1 18XCR BRD8 (SUTURE) ×1 IMPLANT
SUT VIC AB 0 CT1 8-18 (SUTURE) ×6
SUT VIC AB 2-0 CP2 18 (SUTURE) ×5 IMPLANT
SYR 3ML LL SCALE MARK (SYRINGE) IMPLANT
TOWEL GREEN STERILE (TOWEL DISPOSABLE) ×3 IMPLANT
TOWEL GREEN STERILE FF (TOWEL DISPOSABLE) ×3 IMPLANT
TRAY FOLEY MTR SLVR 16FR STAT (SET/KITS/TRAYS/PACK) ×3 IMPLANT
WATER STERILE IRR 1000ML POUR (IV SOLUTION) ×3 IMPLANT

## 2021-02-19 NOTE — Transfer of Care (Signed)
Immediate Anesthesia Transfer of Care Note  Patient: Dean Alvarado  Procedure(s) Performed: Thoracic ten - Lumbar Two POSTERIOR SPINAL INSTRUMENTED FUSION (Back)  Patient Location: PACU  Anesthesia Type:General  Level of Consciousness: awake, drowsy and patient cooperative  Airway & Oxygen Therapy: Patient Spontanous Breathing and Patient connected to face mask oxygen  Post-op Assessment: Report given to RN and Post -op Vital signs reviewed and stable  Post vital signs: Reviewed and stable  Last Vitals:  Vitals Value Taken Time  BP    Temp    Pulse 117 02/19/21 2233  Resp 25 02/19/21 2233  SpO2 92 % 02/19/21 2233  Vitals shown include unvalidated device data.  Last Pain:  Vitals:   02/19/21 1845  TempSrc: Oral  PainSc:          Complications: No notable events documented.

## 2021-02-19 NOTE — Anesthesia Procedure Notes (Signed)
Procedure Name: Intubation Date/Time: 02/19/2021 7:29 PM Performed by: Eligha Bridegroom, CRNA Pre-anesthesia Checklist: Emergency Drugs available, Patient identified, Suction available and Patient being monitored Patient Re-evaluated:Patient Re-evaluated prior to induction Oxygen Delivery Method: Circle system utilized Induction Type: IV induction, Rapid sequence and Cricoid Pressure applied Grade View: Grade I Tube type: Oral Tube size: 7.5 mm Number of attempts: 1 Airway Equipment and Method: Stylet Placement Confirmation: ETT inserted through vocal cords under direct vision, positive ETCO2 and breath sounds checked- equal and bilateral Secured at: 22 cm Tube secured with: Tape Dental Injury: Teeth and Oropharynx as per pre-operative assessment

## 2021-02-19 NOTE — Progress Notes (Signed)
Neurosurgery Service Post-operative progress note  Assessment & Plan: 65 y.o. man s/p T10-L2 PSIF for stabilization of T12 fracture, seen in PACU, MAE x4.  -EBL 850cc -can d/c spine precautions and have activity as tolerated from a spine standpoint  Dean Alvarado  02/19/21 10:16 PM

## 2021-02-19 NOTE — ED Notes (Signed)
Pt was taken to CT with RN
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
## 2021-02-19 NOTE — Anesthesia Preprocedure Evaluation (Addendum)
Anesthesia Evaluation  Patient identified by MRN, date of birth, ID band Patient awake    Reviewed: Allergy & Precautions, NPO status , Patient's Chart, lab work & pertinent test results  Airway Mallampati: II  TM Distance: >3 FB Neck ROM: Full    Dental  (+) Teeth Intact, Dental Advisory Given   Pulmonary former smoker,    breath sounds clear to auscultation       Cardiovascular hypertension, Pt. on medications + Peripheral Vascular Disease   Rhythm:Regular Rate:Normal     Neuro/Psych Anxiety TIA   GI/Hepatic negative GI ROS, Neg liver ROS,   Endo/Other  diabetes, Type 2, Oral Hypoglycemic Agents  Renal/GU negative Renal ROS     Musculoskeletal negative musculoskeletal ROS (+)   Abdominal Normal abdominal exam  (+)   Peds  Hematology negative hematology ROS (+)   Anesthesia Other Findings CT Chest: Lungs/Pleura: Small bilateral effusions. Compressive/dependent atelectasis in the lower lobes. Tiny right pneumothorax.   Reproductive/Obstetrics                            Anesthesia Physical Anesthesia Plan  ASA: 3 and emergent  Anesthesia Plan: General   Post-op Pain Management:    Induction: Intravenous, Rapid sequence and Cricoid pressure planned  PONV Risk Score and Plan: 3 and Ondansetron, Dexamethasone and Midazolam  Airway Management Planned: Oral ETT  Additional Equipment: None  Intra-op Plan:   Post-operative Plan: Extubation in OR  Informed Consent: I have reviewed the patients History and Physical, chart, labs and discussed the procedure including the risks, benefits and alternatives for the proposed anesthesia with the patient or authorized representative who has indicated his/her understanding and acceptance.     Dental advisory given  Plan Discussed with: CRNA  Anesthesia Plan Comments: (Will monitor for Tension PTX closely. )       Anesthesia Quick  Evaluation
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
## 2021-02-19 NOTE — ED Notes (Signed)
Patient transported to short stay with TRN
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
## 2021-02-19 NOTE — ED Notes (Signed)
OR consent signed with Dr. Venetia Constable.  Report given to short stay RN- transported to short stay  Daughter Lorre Nick-- [place] - [phone]  Rolene Arbour, RN Trauma Response Nurse [phone]
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
## 2021-02-20 ENCOUNTER — Inpatient Hospital Stay (HOSPITAL_COMMUNITY): Payer: Medicare HMO

## 2021-02-20 ENCOUNTER — Encounter (HOSPITAL_COMMUNITY): Payer: Self-pay | Admitting: Neurological Surgery

## 2021-02-20 LAB — BASIC METABOLIC PANEL
Anion gap: 7 (ref 5–15)
BUN: 22 mg/dL (ref 8–23)
CO2: 24 mmol/L (ref 22–32)
Calcium: 8 mg/dL — ABNORMAL LOW (ref 8.9–10.3)
Chloride: 100 mmol/L (ref 98–111)
Creatinine, Ser: 1.13 mg/dL (ref 0.61–1.24)
GFR, Estimated: >60 mL/min (ref >60)
Glucose, Bld: 274 mg/dL — ABNORMAL HIGH (ref 70–99)
Potassium: 5 mmol/L (ref 3.5–5.1)
Sodium: 131 mmol/L — ABNORMAL LOW (ref 135–145)

## 2021-02-20 LAB — CBC
HCT: 26.3 % — ABNORMAL LOW (ref 39.0–52.0)
Hemoglobin: 9.1 g/dL — ABNORMAL LOW (ref 13.0–17.0)
MCH: 32.6 pg (ref 26.0–34.0)
MCHC: 34.6 g/dL (ref 30.0–36.0)
MCV: 94.3 fL (ref 80.0–100.0)
Platelets: 181 10*3/uL (ref 150–400)
RBC: 2.79 MIL/uL — ABNORMAL LOW (ref 4.22–5.81)
RDW: 12.8 % (ref 11.5–15.5)
WBC: 14.2 10*3/uL — ABNORMAL HIGH (ref 4.0–10.5)
nRBC: 0 % (ref 0.0–0.2)

## 2021-02-20 LAB — HEMOGLOBIN A1C
Hgb A1c MFr Bld: 7 % — ABNORMAL HIGH (ref 4.8–5.6)
Mean Plasma Glucose: 154.2 mg/dL

## 2021-02-20 LAB — GLUCOSE, CAPILLARY
Glucose-Capillary: 228 mg/dL — ABNORMAL HIGH (ref 70–99)
Glucose-Capillary: 174 mg/dL — ABNORMAL HIGH (ref 70–99)
Glucose-Capillary: 170 mg/dL — ABNORMAL HIGH (ref 70–99)

## 2021-02-20 LAB — LACTIC ACID, PLASMA: Lactic Acid, Venous: 3 mmol/L (ref 0.5–1.9)

## 2021-02-20 LAB — HIV ANTIBODY (ROUTINE TESTING W REFLEX): HIV Screen 4th Generation wRfx: NONREACTIVE

## 2021-02-20 MED ORDER — HYDROMORPHONE HCL 1 MG/ML IJ SOLN
1.0000 mg | INTRAMUSCULAR | Status: DC | PRN
  Administered 2021-02-20: 1 mg via INTRAVENOUS
  Filled 2021-02-20: qty 1

## 2021-02-20 MED ORDER — FLUOXETINE HCL 20 MG PO CAPS
20.0000 mg | ORAL_CAPSULE | Freq: Every day | ORAL | Status: DC
  Administered 2021-02-20 – 2021-03-02 (×11): 20 mg via ORAL
  Filled 2021-02-20 (×11): qty 1

## 2021-02-20 MED ORDER — FOLIC ACID 1 MG PO TABS
1.0000 mg | ORAL_TABLET | Freq: Every day | ORAL | Status: DC
  Administered 2021-02-20 – 2021-03-08 (×17): 1 mg via ORAL
  Filled 2021-02-20 (×17): qty 1

## 2021-02-20 MED ORDER — LISINOPRIL 20 MG PO TABS
20.0000 mg | ORAL_TABLET | Freq: Every day | ORAL | Status: DC
  Administered 2021-02-20 – 2021-02-21 (×2): 20 mg via ORAL
  Filled 2021-02-20 (×2): qty 1

## 2021-02-20 MED ORDER — LISINOPRIL-HYDROCHLOROTHIAZIDE 20-12.5 MG PO TABS: 1.0000 | ORAL_TABLET | Freq: Every day | ORAL | Status: DC

## 2021-02-20 MED ORDER — POLYETHYLENE GLYCOL 3350 17 G PO PACK
17.0000 g | PACK | Freq: Every day | ORAL | Status: DC
  Administered 2021-02-20: 17 g via ORAL
  Filled 2021-02-20: qty 1

## 2021-02-20 MED ORDER — OXYCODONE HCL 5 MG PO TABS
5.0000 mg | ORAL_TABLET | ORAL | Status: DC | PRN
  Administered 2021-02-20 – 2021-03-01 (×26): 10 mg via ORAL
  Administered 2021-03-01: 5 mg via ORAL
  Administered 2021-03-02 – 2021-03-05 (×15): 10 mg via ORAL
  Administered 2021-03-05: 5 mg via ORAL
  Administered 2021-03-06 – 2021-03-08 (×10): 10 mg via ORAL
  Filled 2021-02-20 (×28): qty 2
  Filled 2021-02-20: qty 1
  Filled 2021-02-20 (×2): qty 2
  Filled 2021-02-20: qty 1
  Filled 2021-02-20 (×21): qty 2

## 2021-02-20 MED ORDER — HYDROCHLOROTHIAZIDE 12.5 MG PO TABS
12.5000 mg | ORAL_TABLET | Freq: Every day | ORAL | Status: DC
  Administered 2021-02-20 – 2021-02-21 (×2): 12.5 mg via ORAL
  Filled 2021-02-20 (×2): qty 1

## 2021-02-20 MED ORDER — ENOXAPARIN SODIUM 30 MG/0.3ML IJ SOSY
30.0000 mg | PREFILLED_SYRINGE | Freq: Two times a day (BID) | INTRAMUSCULAR | Status: DC
  Administered 2021-02-20 (×2): 30 mg via SUBCUTANEOUS
  Filled 2021-02-20 (×2): qty 0.3

## 2021-02-20 MED ORDER — METFORMIN HCL 500 MG PO TABS: 500.0000 mg | ORAL_TABLET | Freq: Two times a day (BID) | ORAL | Status: DC

## 2021-02-20 MED ORDER — POTASSIUM CHLORIDE IN NACL 20-0.9 MEQ/L-% IV SOLN
INTRAVENOUS | Status: DC
  Filled 2021-02-20 (×4): qty 1000

## 2021-02-20 MED ORDER — LORAZEPAM 1 MG PO TABS
1.0000 mg | ORAL_TABLET | ORAL | Status: AC | PRN
  Administered 2021-02-20 – 2021-02-22 (×2): 2 mg via ORAL
  Filled 2021-02-20 (×2): qty 2

## 2021-02-20 MED ORDER — METHOCARBAMOL 750 MG PO TABS: 750.0000 mg | ORAL_TABLET | Freq: Four times a day (QID) | ORAL | Status: DC | PRN

## 2021-02-20 MED ORDER — ADULT MULTIVITAMIN W/MINERALS CH
1.0000 | ORAL_TABLET | Freq: Every day | ORAL | Status: DC
  Administered 2021-02-20 – 2021-03-08 (×17): 1 via ORAL
  Filled 2021-02-20 (×17): qty 1

## 2021-02-20 MED ORDER — HYDROMORPHONE HCL 1 MG/ML IJ SOLN
0.5000 mg | INTRAMUSCULAR | Status: DC | PRN
  Administered 2021-02-20 – 2021-02-25 (×18): 1 mg via INTRAVENOUS
  Filled 2021-02-20 (×18): qty 1

## 2021-02-20 MED ORDER — LORAZEPAM 2 MG/ML IJ SOLN
1.0000 mg | INTRAMUSCULAR | Status: AC | PRN
  Administered 2021-02-22 (×3): 2 mg via INTRAVENOUS
  Administered 2021-02-23: 1 mg via INTRAVENOUS
  Administered 2021-02-23: 2 mg via INTRAVENOUS
  Filled 2021-02-20 (×6): qty 1

## 2021-02-20 MED ORDER — INSULIN ASPART 100 UNIT/ML IJ SOLN
0.0000 [IU] | Freq: Three times a day (TID) | INTRAMUSCULAR | Status: DC
  Administered 2021-02-20: 5 [IU] via SUBCUTANEOUS
  Administered 2021-02-20: 3 [IU] via SUBCUTANEOUS

## 2021-02-20 MED ORDER — THIAMINE HCL 100 MG PO TABS
100.0000 mg | ORAL_TABLET | Freq: Every day | ORAL | Status: DC
  Administered 2021-02-21 – 2021-03-08 (×16): 100 mg via ORAL
  Filled 2021-02-20 (×17): qty 1

## 2021-02-20 MED ORDER — ONDANSETRON HCL 4 MG/2ML IJ SOLN: 4.0000 mg | Freq: Four times a day (QID) | INTRAMUSCULAR | Status: DC | PRN

## 2021-02-20 MED ORDER — CHLORHEXIDINE GLUCONATE CLOTH 2 % EX PADS
6.0000 | MEDICATED_PAD | Freq: Every day | CUTANEOUS | Status: DC
  Administered 2021-02-20 – 2021-03-08 (×14): 6 via TOPICAL

## 2021-02-20 MED ORDER — LORAZEPAM 2 MG/ML IJ SOLN: 0.0000 mg | Freq: Two times a day (BID) | INTRAMUSCULAR | Status: AC

## 2021-02-20 MED ORDER — ONDANSETRON 4 MG PO TBDP: 4.0000 mg | ORAL_TABLET | Freq: Four times a day (QID) | ORAL | Status: DC | PRN

## 2021-02-20 MED ORDER — POLYETHYLENE GLYCOL 3350 17 G PO PACK
17.0000 g | PACK | Freq: Every day | ORAL | Status: DC | PRN
  Administered 2021-02-25: 17 g via ORAL
  Filled 2021-02-20: qty 1

## 2021-02-20 MED ORDER — LORAZEPAM 2 MG/ML IJ SOLN
0.0000 mg | Freq: Four times a day (QID) | INTRAMUSCULAR | Status: AC
  Administered 2021-02-20: 1 mg via INTRAVENOUS
  Administered 2021-02-21 (×2): 2 mg via INTRAVENOUS
  Administered 2021-02-21: 1 mg via INTRAVENOUS
  Filled 2021-02-20 (×3): qty 1

## 2021-02-20 MED ORDER — THIAMINE HCL 100 MG/ML IJ SOLN
100.0000 mg | Freq: Every day | INTRAMUSCULAR | Status: DC
  Administered 2021-02-20: 100 mg via INTRAVENOUS
  Filled 2021-02-20 (×5): qty 2

## 2021-02-20 MED ORDER — ATORVASTATIN CALCIUM 10 MG PO TABS
20.0000 mg | ORAL_TABLET | Freq: Every day | ORAL | Status: DC
  Administered 2021-02-20 – 2021-03-08 (×17): 20 mg via ORAL
  Filled 2021-02-20 (×17): qty 2

## 2021-02-20 MED ORDER — DOCUSATE SODIUM 100 MG PO CAPS
100.0000 mg | ORAL_CAPSULE | Freq: Two times a day (BID) | ORAL | Status: DC
  Administered 2021-02-20 – 2021-03-08 (×26): 100 mg via ORAL
  Filled 2021-02-20 (×31): qty 1

## 2021-02-20 MED ORDER — ACETAMINOPHEN 500 MG PO TABS
1000.0000 mg | ORAL_TABLET | Freq: Four times a day (QID) | ORAL | Status: DC
  Administered 2021-02-20 – 2021-03-08 (×55): 1000 mg via ORAL
  Filled 2021-02-20 (×61): qty 2
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
## 2021-02-20 NOTE — Progress Notes (Signed)
Inpatient Diabetes Program Recommendations  AACE/ADA: New Consensus Statement on Inpatient Glycemic Control (2015)  Target Ranges:  Prepandial:   less than 140 mg/dL      Peak postprandial:   less than 180 mg/dL (1-2 hours)      Critically ill patients:  140 - 180 mg/dL   Lab Results  Component Value Date   GLUCAP 240 (H) 02/19/2021   HGBA1C 7.0 (H) 02/20/2021    Review of Glycemic Control Results for Dean Alvarado, Dean "TOM" (MRN [id number]) as of 02/20/2021 10:24  Ref. Range 02/19/2021 23:19  Glucose-Capillary Latest Ref Range: 70 - 99 mg/dL 240 (H)   Diabetes history: Type 2 DM Outpatient Diabetes medications: Metformin 500 mg BID Current orders for Inpatient glycemic control: Novolog 0-15 units TID Decadron 10 mg x 1  Inpatient Diabetes Program Recommendations:    With patient being on clear liquid and in the setting of steroids, consider changing correction to Novolog 0-15 units Q4H.   Thanks, Bronson Curb, MSN, RNC-OB Diabetes Coordinator [phone] (8a-5p)

## 2021-02-20 NOTE — Anesthesia Postprocedure Evaluation (Signed)
Anesthesia Post Note  Patient: Dean Alvarado  Procedure(s) Performed: Thoracic ten - Lumbar Two POSTERIOR SPINAL INSTRUMENTED FUSION (Back)     Patient location during evaluation: PACU Anesthesia Type: General Level of consciousness: awake and alert Pain management: pain level controlled Vital Signs Assessment: post-procedure vital signs reviewed and stable Respiratory status: spontaneous breathing, nonlabored ventilation, respiratory function stable and patient connected to nasal cannula oxygen Cardiovascular status: blood pressure returned to baseline and stable Postop Assessment: no apparent nausea or vomiting Anesthetic complications: no   No notable events documented.  Last Vitals:  Vitals:   02/20/21 0151 02/20/21 0352  BP: (!) 135/93 124/86  Pulse: (!) 104 (!) 108  Resp: 20 18  Temp: 36.7 C 36.7 C  SpO2: 93% 94%                Effie Berkshire
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
## 2021-02-21 ENCOUNTER — Inpatient Hospital Stay (HOSPITAL_COMMUNITY): Payer: Medicare HMO

## 2021-02-21 DIAGNOSIS — R079 Chest pain, unspecified: Secondary | ICD-10-CM

## 2021-02-21 DIAGNOSIS — S2249XA Multiple fractures of ribs, unspecified side, initial encounter for closed fracture: Secondary | ICD-10-CM | POA: Diagnosis present

## 2021-02-21 LAB — POCT I-STAT 7, (LYTES, BLD GAS, ICA,H+H)
Acid-base deficit: 1 mmol/L (ref 0.0–2.0)
Bicarbonate: 24.4 mmol/L (ref 20.0–28.0)
Calcium, Ion: 1.17 mmol/L (ref 1.15–1.40)
HCT: 23 % — ABNORMAL LOW (ref 39.0–52.0)
Hemoglobin: 7.8 g/dL — ABNORMAL LOW (ref 13.0–17.0)
O2 Saturation: 93 %
Patient temperature: 98.8
Potassium: 4.6 mmol/L (ref 3.5–5.1)
Sodium: 134 mmol/L — ABNORMAL LOW (ref 135–145)
TCO2: 26 mmol/L (ref 22–32)
pCO2 arterial: 45.8 mmHg (ref 32.0–48.0)
pH, Arterial: 7.334 — ABNORMAL LOW (ref 7.350–7.450)
pO2, Arterial: 72 mmHg — ABNORMAL LOW (ref 83.0–108.0)

## 2021-02-21 LAB — CBC
HCT: 23.9 % — ABNORMAL LOW (ref 39.0–52.0)
HCT: 26.1 % — ABNORMAL LOW (ref 39.0–52.0)
Hemoglobin: 8.2 g/dL — ABNORMAL LOW (ref 13.0–17.0)
Hemoglobin: 8.8 g/dL — ABNORMAL LOW (ref 13.0–17.0)
MCH: 32.4 pg (ref 26.0–34.0)
MCH: 32.2 pg (ref 26.0–34.0)
MCHC: 34.3 g/dL (ref 30.0–36.0)
MCHC: 33.7 g/dL (ref 30.0–36.0)
MCV: 94.5 fL (ref 80.0–100.0)
MCV: 95.6 fL (ref 80.0–100.0)
Platelets: 161 10*3/uL (ref 150–400)
Platelets: 204 10*3/uL (ref 150–400)
RBC: 2.53 MIL/uL — ABNORMAL LOW (ref 4.22–5.81)
RBC: 2.73 MIL/uL — ABNORMAL LOW (ref 4.22–5.81)
RDW: 12.8 % (ref 11.5–15.5)
RDW: 12.8 % (ref 11.5–15.5)
WBC: 13.8 10*3/uL — ABNORMAL HIGH (ref 4.0–10.5)
WBC: 17.4 10*3/uL — ABNORMAL HIGH (ref 4.0–10.5)
nRBC: 0 % (ref 0.0–0.2)
nRBC: 0 % (ref 0.0–0.2)

## 2021-02-21 LAB — ECHOCARDIOGRAM COMPLETE
AR max vel: 1.45 cm2
AV Area VTI: 1.77 cm2
AV Area mean vel: 1.49 cm2
AV Mean grad: 12.5 mmHg
AV Peak grad: 20.7 mmHg
Ao pk vel: 2.28 m/s
Area-P 1/2: 3.56 cm2
Calc EF: 52.4 %
Height: 68 in
S' Lateral: 2.64 cm
Single Plane A2C EF: 55.8 %
Single Plane A4C EF: 51 %
Weight: 3506.2 oz

## 2021-02-21 LAB — BASIC METABOLIC PANEL
Anion gap: 5 (ref 5–15)
BUN: 21 mg/dL (ref 8–23)
CO2: 26 mmol/L (ref 22–32)
Calcium: 8.1 mg/dL — ABNORMAL LOW (ref 8.9–10.3)
Chloride: 103 mmol/L (ref 98–111)
Creatinine, Ser: 0.91 mg/dL (ref 0.61–1.24)
GFR, Estimated: >60 mL/min (ref >60)
Glucose, Bld: 185 mg/dL — ABNORMAL HIGH (ref 70–99)
Potassium: 4.8 mmol/L (ref 3.5–5.1)
Sodium: 134 mmol/L — ABNORMAL LOW (ref 135–145)

## 2021-02-21 LAB — GLUCOSE, CAPILLARY
Glucose-Capillary: 222 mg/dL — ABNORMAL HIGH (ref 70–99)
Glucose-Capillary: 217 mg/dL — ABNORMAL HIGH (ref 70–99)
Glucose-Capillary: 202 mg/dL — ABNORMAL HIGH (ref 70–99)
Glucose-Capillary: 153 mg/dL — ABNORMAL HIGH (ref 70–99)
Glucose-Capillary: 120 mg/dL — ABNORMAL HIGH (ref 70–99)

## 2021-02-21 MED ORDER — ENOXAPARIN SODIUM 40 MG/0.4ML IJ SOSY
40.0000 mg | PREFILLED_SYRINGE | Freq: Two times a day (BID) | INTRAMUSCULAR | Status: DC
  Administered 2021-02-21 – 2021-03-08 (×29): 40 mg via SUBCUTANEOUS
  Filled 2021-02-21 (×30): qty 0.4

## 2021-02-21 MED ORDER — PERFLUTREN LIPID MICROSPHERE
1.0000 mL | INTRAVENOUS | Status: AC | PRN
  Administered 2021-02-21: 2 mL via INTRAVENOUS
  Filled 2021-02-21: qty 10

## 2021-02-21 MED ORDER — IPRATROPIUM-ALBUTEROL 0.5-2.5 (3) MG/3ML IN SOLN
3.0000 mL | Freq: Four times a day (QID) | RESPIRATORY_TRACT | Status: DC
  Administered 2021-02-21 – 2021-02-22 (×3): 3 mL via RESPIRATORY_TRACT
  Filled 2021-02-21 (×4): qty 3

## 2021-02-21 MED ORDER — NOREPINEPHRINE 4 MG/250ML-% IV SOLN
INTRAVENOUS | Status: AC
  Administered 2021-02-21: 4 mg
  Filled 2021-02-21: qty 250

## 2021-02-21 MED ORDER — INSULIN ASPART 100 UNIT/ML IJ SOLN
0.0000 [IU] | INTRAMUSCULAR | Status: DC
Start: 2021-02-21 — End: 2021-02-23
  Administered 2021-02-21: 3 [IU] via SUBCUTANEOUS
  Administered 2021-02-21 (×2): 5 [IU] via SUBCUTANEOUS
  Administered 2021-02-22: 2 [IU] via SUBCUTANEOUS
  Administered 2021-02-22: 3 [IU] via SUBCUTANEOUS
  Administered 2021-02-22: 2 [IU] via SUBCUTANEOUS
  Administered 2021-02-22: 3 [IU] via SUBCUTANEOUS
  Administered 2021-02-22: 2 [IU] via SUBCUTANEOUS
  Administered 2021-02-23: 3 [IU] via SUBCUTANEOUS

## 2021-02-21 MED ORDER — SODIUM CHLORIDE 0.9 % IV BOLUS
500.0000 mL | Freq: Once | INTRAVENOUS | Status: AC
Start: 2021-02-21 — End: 2021-02-21
  Administered 2021-02-21: 500 mL via INTRAVENOUS

## 2021-02-21 MED ORDER — NOREPINEPHRINE 4 MG/250ML-% IV SOLN
0.0000 ug/min | INTRAVENOUS | Status: DC
  Administered 2021-02-21: 4 ug/min via INTRAVENOUS

## 2021-02-21 MED ORDER — SODIUM CHLORIDE 0.9 % IV SOLN: INTRAVENOUS | Status: DC

## 2021-02-21 MED ORDER — METHOCARBAMOL 750 MG PO TABS
750.0000 mg | ORAL_TABLET | Freq: Four times a day (QID) | ORAL | Status: DC
  Administered 2021-02-21 – 2021-03-04 (×42): 750 mg via ORAL
  Filled 2021-02-21 (×2): qty 1
  Filled 2021-02-21 (×6): qty 2
  Filled 2021-02-21: qty 1
  Filled 2021-02-21 (×2): qty 2
  Filled 2021-02-21 (×3): qty 1
  Filled 2021-02-21: qty 2
  Filled 2021-02-21 (×2): qty 1
  Filled 2021-02-21: qty 2
  Filled 2021-02-21: qty 1
  Filled 2021-02-21 (×3): qty 2
  Filled 2021-02-21 (×2): qty 1
  Filled 2021-02-21 (×3): qty 2
  Filled 2021-02-21: qty 1
  Filled 2021-02-21 (×2): qty 2
  Filled 2021-02-21: qty 1
  Filled 2021-02-21 (×4): qty 2
  Filled 2021-02-21 (×5): qty 1
  Filled 2021-02-21: qty 2
  Filled 2021-02-21: qty 1

## 2021-02-21 MED ORDER — TRAMADOL HCL 50 MG PO TABS
50.0000 mg | ORAL_TABLET | Freq: Four times a day (QID) | ORAL | Status: DC
  Administered 2021-02-21 – 2021-03-08 (×56): 50 mg via ORAL
  Filled 2021-02-21 (×57): qty 1

## 2021-02-21 MED ORDER — SODIUM CHLORIDE 0.9 % IV SOLN: INTRAVENOUS | Status: DC | PRN

## 2021-02-21 MED ORDER — KETOROLAC TROMETHAMINE 15 MG/ML IJ SOLN
30.0000 mg | Freq: Four times a day (QID) | INTRAMUSCULAR | Status: AC
  Administered 2021-02-21 – 2021-02-26 (×20): 30 mg via INTRAVENOUS
  Filled 2021-02-21 (×20): qty 2

## 2021-02-21 MED ORDER — IPRATROPIUM-ALBUTEROL 0.5-2.5 (3) MG/3ML IN SOLN
3.0000 mL | Freq: Four times a day (QID) | RESPIRATORY_TRACT | Status: DC
  Administered 2021-02-21: 3 mL via RESPIRATORY_TRACT
  Filled 2021-02-21: qty 3

## 2021-02-21 MED ORDER — LIDOCAINE 5 % EX PTCH
3.0000 | MEDICATED_PATCH | CUTANEOUS | Status: DC
  Administered 2021-02-21: 3 via TRANSDERMAL
  Administered 2021-02-22 – 2021-02-23 (×2): 1 via TRANSDERMAL
  Administered 2021-02-24 – 2021-03-07 (×12): 3 via TRANSDERMAL
  Administered 2021-03-08: 10:00:00 1 via TRANSDERMAL
  Filled 2021-02-21 (×18): qty 3

## 2021-02-21 MED ORDER — NICOTINE 14 MG/24HR TD PT24
14.0000 mg | MEDICATED_PATCH | Freq: Every day | TRANSDERMAL | Status: DC
  Administered 2021-02-21 – 2021-03-07 (×15): 14 mg via TRANSDERMAL
  Filled 2021-02-21 (×16): qty 1

## 2021-02-21 MED ORDER — GUAIFENESIN ER 600 MG PO TB12
600.0000 mg | ORAL_TABLET | Freq: Two times a day (BID) | ORAL | Status: DC
  Administered 2021-02-21 – 2021-03-08 (×33): 600 mg via ORAL
  Filled 2021-02-21 (×34): qty 1
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
## 2021-02-21 NOTE — Progress Notes (Signed)
Came to reassess the patient  Patient noted to be sitting up in the chair on 8L o2 with o2 sats in the mid 80's to low 90's. He appears to be splinting still, pale and clammy. He complains of sob and continued severe back and rib pain. His systolic pressures are in the 80's and it appears the last few on the monitor have been in the 70's. He is not moving air very well b/l on auscultating. His abdomen is distended, tight, tympanic and with decreased bowel sounds. He reports he is not passing any flatus still. I placed the blood pressure cuff back on to re-cycle and found BP was truly in the 80's. Patients RN and TRN x 2 here to assist. I increased his o2 to 10L with sats now in the 90's with good waveform. Duoneb ordered for now. FV brought to the room and patient actively working on this. ABG ordered. Resp notified. I am going to get a CXR, 2 view if possible, to ensure the patient does have an enlarging PTX given the right apical pneumothorax that was seen on yesterdays xray. Today's xray had no PTX, but give the opacity in the right upper/mid lung it was difficult to exclude. I have called xray and discussed that patient will need this done as a portable and they stated understanding and will come do this stat. 500cc bolus running. Continuous fluids to start after completion of bolus. We are putting in another IV line. Will continue to cycle pressure. Lab came to recheck CBC/hgb given soft BP. His back surgical incision was inspected without obvious hematoma. Patient was moved back to bed. Will make NPO as I am concerned he is developing an ileus given exam. I am very concerned the patient may progress to requiring intubation. I discussed this with the patient who stated understanding. Discussed with my MD and we agree with decision of moving to ICU for monitoring. I have called 4N to notify them. We will be going to 4N15. Will monitor closely.   Alferd Apa, [facility] Surgery  02/21/2021,  1:20 PM

## 2021-02-21 NOTE — Procedures (Signed)
Arterial Catheter Insertion Procedure Note  GOLDMAN BIRCHALL  [id number]  [birth year]  Date:02/21/21  Time:2:51 PM    Provider Performing: Kathie Dike    Procedure: Insertion of Arterial Line [phone]) without US guidance  Indication(s) Blood pressure monitoring and/or need for frequent ABGs  Consent Risks of the procedure as well as the alternatives and risks of each were explained to the patient and/or caregiver.  Consent for the procedure was obtained and is signed in the bedside chart  Anesthesia None   Time Out Verified patient identification, verified procedure, site/side was marked, verified correct patient position, special equipment/implants available, medications/allergies/relevant history reviewed, required imaging and test results available.   Sterile Technique Maximal sterile technique including full sterile barrier drape, hand hygiene, sterile gown, sterile gloves, mask, hair covering, sterile ultrasound probe cover (if used).   Procedure Description Area of catheter insertion was cleaned with chlorhexidine and draped in sterile fashion. Without real-time ultrasound guidance an arterial catheter was placed into the right radial artery.  Appropriate arterial tracings confirmed on monitor.     Complications/Tolerance None; patient tolerated the procedure well.   EBL Minimal   Specimen(s) None
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
## 2021-02-21 NOTE — TOC CAGE-AID Note (Signed)
Transition of Care [facility]) - CAGE-AID Screening   Patient Details  Name: Dean Alvarado MRN: [id number] Date of Birth: 1955/08/16  [facility] CM/SW Contact:    Charlayne Vultaggio C Tarpley-Carter, [place] Phone Number: 02/21/2021, 3:00 PM   Clinical Narrative: Pt is unable to participate in Cage Aid.   Shamond Skelton Tarpley-Carter, MSW, LCSW-A Pronouns:  She/Her/Hers [facility]itions of Care Clinical Social Worker Direct Number:  [phone] [email or web address]  CAGE-AID Screening: Substance Abuse Screening unable to be completed due to: : Patient unable to participate             Substance Abuse Education Offered: No

## 2021-02-21 NOTE — Progress Notes (Signed)
Transferred to 4N With monitor.   Rolene Arbour, RN

## 2021-02-21 NOTE — Progress Notes (Signed)
Pt BPs soft, MAPs right at 60. UOP right at 30/hr. Pt clammy. Discussed with MD, orders received, will monitor closely.

## 2021-02-21 NOTE — Progress Notes (Addendum)
Pt arrives to [place] ICU at 1339. SOB, tachypneic, sweaty. Able to talk and f/c. BPs soft. Pain 10/10 and preventing him from breathing deeply.

## 2021-02-21 NOTE — Progress Notes (Signed)
Pain med given , pt reports relief. BP discussed with Dr Bobbye Morton. Orders received. RT placing art line.

## 2021-02-21 NOTE — Progress Notes (Signed)
Patient having pain and anxiety. Continue to take vitals per yellow MEWS guidelines. See MAR for PRNs.   02/21/21 0352  Assess: MEWS Score  Temp 98.1 F (36.7 C)  BP 108/73  Pulse Rate (!) 112  ECG Heart Rate (!) 111  Resp 18  SpO2 94 %  Assess: MEWS Score  MEWS Temp 0  MEWS Systolic 0  MEWS Pulse 2  MEWS RR 0  MEWS LOC 0  MEWS Score 2  MEWS Score Color Yellow  Assess: if the MEWS score is Yellow or Red  Were vital signs taken at a resting state? Yes  Focused Assessment No change from prior assessment  Early Detection of Sepsis Score *See Row Information* Low  MEWS guidelines implemented *See Row Information* Yes  Take Vital Signs  Increase Vital Sign Frequency  Yellow: Q 2hr X 2 then Q 4hr X 2, if remains yellow, continue Q 4hrs  Escalate  MEWS: Escalate Yellow: discuss with charge nurse/RN and consider discussing with provider and RRT  Notify: Charge Nurse/RN  Name of Charge Nurse/RN Notified Sani Madariaga RN  Date Charge Nurse/RN Notified 02/21/21  Time Charge Nurse/RN Notified 3151  Notify: Provider  Provider Name/Title Grandville Silos MD  Date Provider Notified 02/21/21  Notification Reason Other (Comment) (Yellow MEWS-Pain)  Provider response See new orders  Document  Patient Outcome Other (Comment) (Stable)  Progress note created (see row info) Yes
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
## 2021-02-21 NOTE — Progress Notes (Signed)
Daughter Lorre Nick updated over phone. [place]
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
## 2021-02-22 ENCOUNTER — Inpatient Hospital Stay (HOSPITAL_COMMUNITY): Payer: Medicare HMO

## 2021-02-22 LAB — CBC
HCT: 22.9 % — ABNORMAL LOW (ref 39.0–52.0)
Hemoglobin: 7.7 g/dL — ABNORMAL LOW (ref 13.0–17.0)
MCH: 32.1 pg (ref 26.0–34.0)
MCHC: 33.6 g/dL (ref 30.0–36.0)
MCV: 95.4 fL (ref 80.0–100.0)
Platelets: 187 10*3/uL (ref 150–400)
RBC: 2.4 MIL/uL — ABNORMAL LOW (ref 4.22–5.81)
RDW: 12.7 % (ref 11.5–15.5)
WBC: 8.3 10*3/uL (ref 4.0–10.5)
nRBC: 0 % (ref 0.0–0.2)

## 2021-02-22 LAB — BASIC METABOLIC PANEL
Anion gap: 7 (ref 5–15)
BUN: 35 mg/dL — ABNORMAL HIGH (ref 8–23)
CO2: 25 mmol/L (ref 22–32)
Calcium: 8.2 mg/dL — ABNORMAL LOW (ref 8.9–10.3)
Chloride: 105 mmol/L (ref 98–111)
Creatinine, Ser: 1.31 mg/dL — ABNORMAL HIGH (ref 0.61–1.24)
GFR, Estimated: >60 mL/min (ref >60)
Glucose, Bld: 132 mg/dL — ABNORMAL HIGH (ref 70–99)
Potassium: 4.2 mmol/L (ref 3.5–5.1)
Sodium: 137 mmol/L (ref 135–145)

## 2021-02-22 LAB — BLOOD GAS, VENOUS
Acid-Base Excess: 0 mmol/L (ref 0.0–2.0)
Bicarbonate: 25.3 mmol/L (ref 20.0–28.0)
FIO2: 52
O2 Saturation: 33.4 %
Patient temperature: 37.2
pCO2, Ven: 50.4 mmHg (ref 44.0–60.0)
pH, Ven: 7.322 (ref 7.250–7.430)
pO2, Ven: <31 mmHg — CL (ref 32.0–45.0)

## 2021-02-22 LAB — GLUCOSE, CAPILLARY
Glucose-Capillary: 129 mg/dL — ABNORMAL HIGH (ref 70–99)
Glucose-Capillary: 121 mg/dL — ABNORMAL HIGH (ref 70–99)
Glucose-Capillary: 163 mg/dL — ABNORMAL HIGH (ref 70–99)
Glucose-Capillary: 173 mg/dL — ABNORMAL HIGH (ref 70–99)
Glucose-Capillary: 132 mg/dL — ABNORMAL HIGH (ref 70–99)
Glucose-Capillary: 159 mg/dL — ABNORMAL HIGH (ref 70–99)

## 2021-02-22 MED ORDER — HALOPERIDOL LACTATE 5 MG/ML IJ SOLN
5.0000 mg | Freq: Four times a day (QID) | INTRAMUSCULAR | Status: DC | PRN
  Administered 2021-02-22 – 2021-02-27 (×3): 5 mg via INTRAVENOUS
  Filled 2021-02-22 (×3): qty 1

## 2021-02-22 MED ORDER — FUROSEMIDE 10 MG/ML IJ SOLN
40.0000 mg | Freq: Once | INTRAMUSCULAR | Status: AC
  Administered 2021-02-22: 40 mg via INTRAVENOUS
  Filled 2021-02-22: qty 4

## 2021-02-22 MED ORDER — IPRATROPIUM-ALBUTEROL 0.5-2.5 (3) MG/3ML IN SOLN
3.0000 mL | Freq: Two times a day (BID) | RESPIRATORY_TRACT | Status: DC
  Administered 2021-02-22 – 2021-02-23 (×3): 3 mL via RESPIRATORY_TRACT
  Filled 2021-02-22 (×5): qty 3

## 2021-02-22 NOTE — Progress Notes (Signed)
Date and time results received: 02/22/21 0520  Test:pO2 Critical Value: <31  Name of Provider Notified: Trauma MD Lovick  Orders Received? Or Actions Taken?: Notified MD, no new orders received   Wyn Quaker, RN
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
## 2021-02-22 NOTE — Progress Notes (Signed)
Venous ABG collected by lab.
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
## 2021-02-22 NOTE — Progress Notes (Signed)
RT attempted ABG X 2.  MD aware.

## 2021-02-23 LAB — TYPE AND SCREEN
ABO/RH(D): A POS
Antibody Screen: NEGATIVE
Unit division: 0
Unit division: 0

## 2021-02-23 LAB — BPAM RBC
Blood Product Expiration Date: 202211172359
Blood Product Expiration Date: 202211172359
Unit Type and Rh: 6200
Unit Type and Rh: 6200

## 2021-02-23 LAB — BASIC METABOLIC PANEL
Anion gap: 7 (ref 5–15)
BUN: 36 mg/dL — ABNORMAL HIGH (ref 8–23)
CO2: 25 mmol/L (ref 22–32)
Calcium: 8.1 mg/dL — ABNORMAL LOW (ref 8.9–10.3)
Chloride: 104 mmol/L (ref 98–111)
Creatinine, Ser: 1.02 mg/dL (ref 0.61–1.24)
GFR, Estimated: >60 mL/min (ref >60)
Glucose, Bld: 120 mg/dL — ABNORMAL HIGH (ref 70–99)
Potassium: 3.7 mmol/L (ref 3.5–5.1)
Sodium: 136 mmol/L (ref 135–145)

## 2021-02-23 LAB — CBC
HCT: 22 % — ABNORMAL LOW (ref 39.0–52.0)
Hemoglobin: 7.6 g/dL — ABNORMAL LOW (ref 13.0–17.0)
MCH: 32.5 pg (ref 26.0–34.0)
MCHC: 34.5 g/dL (ref 30.0–36.0)
MCV: 94 fL (ref 80.0–100.0)
Platelets: 207 10*3/uL (ref 150–400)
RBC: 2.34 MIL/uL — ABNORMAL LOW (ref 4.22–5.81)
RDW: 13 % (ref 11.5–15.5)
WBC: 7.2 10*3/uL (ref 4.0–10.5)
nRBC: 0.3 % — ABNORMAL HIGH (ref 0.0–0.2)

## 2021-02-23 LAB — GLUCOSE, CAPILLARY
Glucose-Capillary: 119 mg/dL — ABNORMAL HIGH (ref 70–99)
Glucose-Capillary: 115 mg/dL — ABNORMAL HIGH (ref 70–99)
Glucose-Capillary: 146 mg/dL — ABNORMAL HIGH (ref 70–99)
Glucose-Capillary: 111 mg/dL — ABNORMAL HIGH (ref 70–99)
Glucose-Capillary: 119 mg/dL — ABNORMAL HIGH (ref 70–99)
Glucose-Capillary: 109 mg/dL — ABNORMAL HIGH (ref 70–99)

## 2021-02-23 MED ORDER — LORAZEPAM 2 MG/ML IJ SOLN
1.0000 mg | INTRAMUSCULAR | Status: AC | PRN
Start: 2021-02-23 — End: 2021-02-26
  Administered 2021-02-23 (×2): 4 mg via INTRAVENOUS
  Administered 2021-02-23: 3 mg via INTRAVENOUS
  Administered 2021-02-24: 4 mg via INTRAVENOUS
  Administered 2021-02-24 (×2): 2 mg via INTRAVENOUS
  Administered 2021-02-25 (×2): 4 mg via INTRAVENOUS
  Administered 2021-02-25: 2 mg via INTRAVENOUS
  Administered 2021-02-25 (×2): 4 mg via INTRAVENOUS
  Filled 2021-02-23 (×3): qty 2
  Filled 2021-02-23 (×2): qty 1
  Filled 2021-02-23 (×3): qty 2
  Filled 2021-02-23: qty 1
  Filled 2021-02-23: qty 2

## 2021-02-23 MED ORDER — LORAZEPAM 1 MG PO TABS
1.0000 mg | ORAL_TABLET | ORAL | Status: AC | PRN
  Administered 2021-02-23: 1 mg via ORAL
  Filled 2021-02-23: qty 1
  Filled 2021-02-23: qty 4

## 2021-02-23 MED ORDER — CHLORDIAZEPOXIDE HCL 25 MG PO CAPS
25.0000 mg | ORAL_CAPSULE | Freq: Three times a day (TID) | ORAL | Status: DC
  Administered 2021-02-23 – 2021-02-26 (×8): 25 mg via ORAL
  Filled 2021-02-23 (×8): qty 1

## 2021-02-23 MED ORDER — CHLORDIAZEPOXIDE HCL 5 MG PO CAPS
10.0000 mg | ORAL_CAPSULE | Freq: Three times a day (TID) | ORAL | Status: DC
  Administered 2021-02-23 (×2): 10 mg via ORAL
  Filled 2021-02-23 (×2): qty 2

## 2021-02-23 MED ORDER — INSULIN ASPART 100 UNIT/ML IJ SOLN
0.0000 [IU] | INTRAMUSCULAR | Status: DC
  Administered 2021-02-23 – 2021-02-24 (×6): 2 [IU] via SUBCUTANEOUS
  Administered 2021-02-25: 3 [IU] via SUBCUTANEOUS
  Administered 2021-02-25: 2 [IU] via SUBCUTANEOUS
  Administered 2021-02-25 – 2021-02-26 (×2): 3 [IU] via SUBCUTANEOUS
  Administered 2021-02-26: 2 [IU] via SUBCUTANEOUS
  Administered 2021-02-26: 3 [IU] via SUBCUTANEOUS
  Administered 2021-02-27 (×4): 2 [IU] via SUBCUTANEOUS
  Administered 2021-02-28: 5 [IU] via SUBCUTANEOUS
  Administered 2021-02-28: 3 [IU] via SUBCUTANEOUS
  Administered 2021-02-28 (×2): 5 [IU] via SUBCUTANEOUS
  Administered 2021-02-28: 2 [IU] via SUBCUTANEOUS
  Administered 2021-03-01 (×2): 5 [IU] via SUBCUTANEOUS
  Administered 2021-03-01: 2 [IU] via SUBCUTANEOUS
  Administered 2021-03-01: 8 [IU] via SUBCUTANEOUS
  Administered 2021-03-01: 3 [IU] via SUBCUTANEOUS
  Administered 2021-03-02 (×2): 2 [IU] via SUBCUTANEOUS
  Administered 2021-03-02: 3 [IU] via SUBCUTANEOUS
  Administered 2021-03-02: 5 [IU] via SUBCUTANEOUS
  Administered 2021-03-02 (×2): 3 [IU] via SUBCUTANEOUS
  Administered 2021-03-03: 2 [IU] via SUBCUTANEOUS
  Administered 2021-03-03 (×2): 3 [IU] via SUBCUTANEOUS
  Administered 2021-03-03: 2 [IU] via SUBCUTANEOUS
  Administered 2021-03-03: 5 [IU] via SUBCUTANEOUS
  Administered 2021-03-03: 2 [IU] via SUBCUTANEOUS
  Administered 2021-03-04 (×3): 3 [IU] via SUBCUTANEOUS
  Administered 2021-03-04 (×2): 2 [IU] via SUBCUTANEOUS
  Administered 2021-03-04 (×2): 3 [IU] via SUBCUTANEOUS
  Administered 2021-03-05: 2 [IU] via SUBCUTANEOUS
  Administered 2021-03-05: 5 [IU] via SUBCUTANEOUS
  Administered 2021-03-05: 2 [IU] via SUBCUTANEOUS
  Administered 2021-03-05: 5 [IU] via SUBCUTANEOUS
  Administered 2021-03-05: 3 [IU] via SUBCUTANEOUS
  Administered 2021-03-06: 2 [IU] via SUBCUTANEOUS
  Administered 2021-03-06 (×2): 3 [IU] via SUBCUTANEOUS
  Administered 2021-03-06 (×2): 2 [IU] via SUBCUTANEOUS
  Administered 2021-03-07: 3 [IU] via SUBCUTANEOUS
  Administered 2021-03-07: 2 [IU] via SUBCUTANEOUS
  Administered 2021-03-07 (×2): 3 [IU] via SUBCUTANEOUS
  Administered 2021-03-07: 5 [IU] via SUBCUTANEOUS
  Administered 2021-03-08: 18:00:00 2 [IU] via SUBCUTANEOUS
  Administered 2021-03-08: 21:00:00 5 [IU] via SUBCUTANEOUS
  Administered 2021-03-08: 08:00:00 3 [IU] via SUBCUTANEOUS
  Administered 2021-03-08: 12:00:00 5 [IU] via SUBCUTANEOUS
  Administered 2021-03-08 (×2): 2 [IU] via SUBCUTANEOUS

## 2021-02-23 MED ORDER — BISACODYL 10 MG RE SUPP
10.0000 mg | Freq: Every day | RECTAL | Status: DC
  Administered 2021-02-23 – 2021-03-01 (×5): 10 mg via RECTAL
  Filled 2021-02-23 (×7): qty 1

## 2021-02-23 MED ORDER — LORAZEPAM 2 MG/ML IJ SOLN
0.0000 mg | Freq: Two times a day (BID) | INTRAMUSCULAR | Status: AC
  Administered 2021-02-23 (×2): 3 mg via INTRAVENOUS
  Administered 2021-02-24: 2 mg via INTRAVENOUS
  Filled 2021-02-23 (×2): qty 2
  Filled 2021-02-23: qty 1
  Filled 2021-02-23: qty 2
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
## 2021-02-24 ENCOUNTER — Other Ambulatory Visit: Payer: Self-pay | Admitting: Family Medicine

## 2021-02-24 ENCOUNTER — Inpatient Hospital Stay (HOSPITAL_COMMUNITY): Payer: Medicare HMO

## 2021-02-24 LAB — CBC
HCT: 23.8 % — ABNORMAL LOW (ref 39.0–52.0)
Hemoglobin: 7.8 g/dL — ABNORMAL LOW (ref 13.0–17.0)
MCH: 31.6 pg (ref 26.0–34.0)
MCHC: 32.8 g/dL (ref 30.0–36.0)
MCV: 96.4 fL (ref 80.0–100.0)
Platelets: 252 10*3/uL (ref 150–400)
RBC: 2.47 MIL/uL — ABNORMAL LOW (ref 4.22–5.81)
RDW: 12.9 % (ref 11.5–15.5)
WBC: 8.8 10*3/uL (ref 4.0–10.5)
nRBC: 0.3 % — ABNORMAL HIGH (ref 0.0–0.2)

## 2021-02-24 LAB — GLUCOSE, CAPILLARY
Glucose-Capillary: 121 mg/dL — ABNORMAL HIGH (ref 70–99)
Glucose-Capillary: 133 mg/dL — ABNORMAL HIGH (ref 70–99)
Glucose-Capillary: 141 mg/dL — ABNORMAL HIGH (ref 70–99)
Glucose-Capillary: 127 mg/dL — ABNORMAL HIGH (ref 70–99)
Glucose-Capillary: 121 mg/dL — ABNORMAL HIGH (ref 70–99)
Glucose-Capillary: 102 mg/dL — ABNORMAL HIGH (ref 70–99)

## 2021-02-24 LAB — BASIC METABOLIC PANEL
Anion gap: 8 (ref 5–15)
BUN: 34 mg/dL — ABNORMAL HIGH (ref 8–23)
CO2: 24 mmol/L (ref 22–32)
Calcium: 8 mg/dL — ABNORMAL LOW (ref 8.9–10.3)
Chloride: 102 mmol/L (ref 98–111)
Creatinine, Ser: 0.98 mg/dL (ref 0.61–1.24)
GFR, Estimated: >60 mL/min (ref >60)
Glucose, Bld: 122 mg/dL — ABNORMAL HIGH (ref 70–99)
Potassium: 3.9 mmol/L (ref 3.5–5.1)
Sodium: 134 mmol/L — ABNORMAL LOW (ref 135–145)

## 2021-02-24 MED ORDER — IPRATROPIUM-ALBUTEROL 0.5-2.5 (3) MG/3ML IN SOLN
3.0000 mL | Freq: Four times a day (QID) | RESPIRATORY_TRACT | Status: DC
  Administered 2021-02-25 (×2): 3 mL via RESPIRATORY_TRACT
  Filled 2021-02-24 (×2): qty 3

## 2021-02-24 MED ORDER — IPRATROPIUM-ALBUTEROL 0.5-2.5 (3) MG/3ML IN SOLN
3.0000 mL | RESPIRATORY_TRACT | Status: DC
  Administered 2021-02-24 (×4): 3 mL via RESPIRATORY_TRACT
  Filled 2021-02-24 (×4): qty 3

## 2021-02-24 MED ORDER — IPRATROPIUM-ALBUTEROL 0.5-2.5 (3) MG/3ML IN SOLN
3.0000 mL | RESPIRATORY_TRACT | Status: DC | PRN
  Administered 2021-02-24 – 2021-03-01 (×2): 3 mL via RESPIRATORY_TRACT
  Filled 2021-02-24: qty 3

## 2021-02-24 MED ORDER — DEXMEDETOMIDINE HCL IN NACL 400 MCG/100ML IV SOLN
0.4000 ug/kg/h | INTRAVENOUS | Status: DC
  Administered 2021-02-24 (×2): 0.4 ug/kg/h via INTRAVENOUS
  Administered 2021-02-25: 0.5 ug/kg/h via INTRAVENOUS
  Administered 2021-02-25: 0.6 ug/kg/h via INTRAVENOUS
  Filled 2021-02-24: qty 100
  Filled 2021-02-24: qty 200

## 2021-02-24 NOTE — Progress Notes (Signed)
Overall progressing well.  No new issues or problems.  Pain reasonably well controlled.  Continue efforts at mobilization.  No new recommendations.
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
## 2021-02-25 ENCOUNTER — Inpatient Hospital Stay (HOSPITAL_COMMUNITY): Payer: Medicare HMO

## 2021-02-25 LAB — BASIC METABOLIC PANEL
Anion gap: 7 (ref 5–15)
BUN: 28 mg/dL — ABNORMAL HIGH (ref 8–23)
CO2: 26 mmol/L (ref 22–32)
Calcium: 7.9 mg/dL — ABNORMAL LOW (ref 8.9–10.3)
Chloride: 102 mmol/L (ref 98–111)
Creatinine, Ser: 0.88 mg/dL (ref 0.61–1.24)
GFR, Estimated: >60 mL/min (ref >60)
Glucose, Bld: 120 mg/dL — ABNORMAL HIGH (ref 70–99)
Potassium: 3.5 mmol/L (ref 3.5–5.1)
Sodium: 135 mmol/L (ref 135–145)

## 2021-02-25 LAB — CBC
HCT: 22.5 % — ABNORMAL LOW (ref 39.0–52.0)
Hemoglobin: 7.5 g/dL — ABNORMAL LOW (ref 13.0–17.0)
MCH: 31.8 pg (ref 26.0–34.0)
MCHC: 33.3 g/dL (ref 30.0–36.0)
MCV: 95.3 fL (ref 80.0–100.0)
Platelets: 270 10*3/uL (ref 150–400)
RBC: 2.36 MIL/uL — ABNORMAL LOW (ref 4.22–5.81)
RDW: 12.9 % (ref 11.5–15.5)
WBC: 8.3 10*3/uL (ref 4.0–10.5)
nRBC: 0.8 % — ABNORMAL HIGH (ref 0.0–0.2)

## 2021-02-25 LAB — GLUCOSE, CAPILLARY
Glucose-Capillary: 120 mg/dL — ABNORMAL HIGH (ref 70–99)
Glucose-Capillary: 111 mg/dL — ABNORMAL HIGH (ref 70–99)
Glucose-Capillary: 190 mg/dL — ABNORMAL HIGH (ref 70–99)
Glucose-Capillary: 163 mg/dL — ABNORMAL HIGH (ref 70–99)
Glucose-Capillary: 110 mg/dL — ABNORMAL HIGH (ref 70–99)
Glucose-Capillary: 123 mg/dL — ABNORMAL HIGH (ref 70–99)

## 2021-02-25 MED ORDER — MORPHINE SULFATE (PF) 4 MG/ML IV SOLN
4.0000 mg | INTRAVENOUS | Status: DC | PRN
  Administered 2021-02-26: 4 mg via INTRAVENOUS
  Filled 2021-02-25: qty 1

## 2021-02-25 MED ORDER — BOOST / RESOURCE BREEZE PO LIQD CUSTOM
1.0000 | Freq: Three times a day (TID) | ORAL | Status: DC
  Administered 2021-02-25 – 2021-03-08 (×20): 1 via ORAL

## 2021-02-25 MED ORDER — MAGNESIUM HYDROXIDE 400 MG/5ML PO SUSP
30.0000 mL | Freq: Once | ORAL | Status: AC
  Administered 2021-02-25: 30 mL via ORAL
  Filled 2021-02-25: qty 30

## 2021-02-25 MED ORDER — IPRATROPIUM-ALBUTEROL 0.5-2.5 (3) MG/3ML IN SOLN
3.0000 mL | Freq: Two times a day (BID) | RESPIRATORY_TRACT | Status: DC
  Administered 2021-02-25 – 2021-02-28 (×6): 3 mL via RESPIRATORY_TRACT
  Filled 2021-02-25 (×6): qty 3

## 2021-02-25 NOTE — Progress Notes (Signed)
Overall stable.  Patient with some lower abdominal discomfort and some symptoms consistent with mild ileus.  No lower extremity dysfunction.  Back pain well controlled.  Motor and sensory function of the lower extremities normal.  Wound clean and dry.  Progressing well following thoracic lumbar stabilization.  Continue to mobilize ad lib.  No new recommendations.
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
## 2021-02-26 LAB — BASIC METABOLIC PANEL
Anion gap: 7 (ref 5–15)
BUN: 24 mg/dL — ABNORMAL HIGH (ref 8–23)
CO2: 27 mmol/L (ref 22–32)
Calcium: 7.8 mg/dL — ABNORMAL LOW (ref 8.9–10.3)
Chloride: 100 mmol/L (ref 98–111)
Creatinine, Ser: 0.91 mg/dL (ref 0.61–1.24)
GFR, Estimated: >60 mL/min (ref >60)
Glucose, Bld: 136 mg/dL — ABNORMAL HIGH (ref 70–99)
Potassium: 3.1 mmol/L — ABNORMAL LOW (ref 3.5–5.1)
Sodium: 134 mmol/L — ABNORMAL LOW (ref 135–145)

## 2021-02-26 LAB — CBC
HCT: 20.9 % — ABNORMAL LOW (ref 39.0–52.0)
Hemoglobin: 6.8 g/dL — CL (ref 13.0–17.0)
MCH: 31.1 pg (ref 26.0–34.0)
MCHC: 32.5 g/dL (ref 30.0–36.0)
MCV: 95.4 fL (ref 80.0–100.0)
Platelets: 322 10*3/uL (ref 150–400)
RBC: 2.19 MIL/uL — ABNORMAL LOW (ref 4.22–5.81)
RDW: 13.1 % (ref 11.5–15.5)
WBC: 8.2 10*3/uL (ref 4.0–10.5)
nRBC: 0.5 % — ABNORMAL HIGH (ref 0.0–0.2)

## 2021-02-26 LAB — HEMOGLOBIN AND HEMATOCRIT, BLOOD
HCT: 29.5 % — ABNORMAL LOW (ref 39.0–52.0)
Hemoglobin: 10 g/dL — ABNORMAL LOW (ref 13.0–17.0)

## 2021-02-26 LAB — PREPARE RBC (CROSSMATCH)

## 2021-02-26 LAB — GLUCOSE, CAPILLARY
Glucose-Capillary: 122 mg/dL — ABNORMAL HIGH (ref 70–99)
Glucose-Capillary: 104 mg/dL — ABNORMAL HIGH (ref 70–99)
Glucose-Capillary: 164 mg/dL — ABNORMAL HIGH (ref 70–99)
Glucose-Capillary: 97 mg/dL (ref 70–99)
Glucose-Capillary: 196 mg/dL — ABNORMAL HIGH (ref 70–99)
Glucose-Capillary: 146 mg/dL — ABNORMAL HIGH (ref 70–99)

## 2021-02-26 LAB — MAGNESIUM: Magnesium: 2.3 mg/dL (ref 1.7–2.4)

## 2021-02-26 MED ORDER — LORAZEPAM 2 MG/ML IJ SOLN
1.0000 mg | INTRAMUSCULAR | Status: AC | PRN
Start: 2021-02-26 — End: 2021-03-01
  Administered 2021-02-26: 4 mg via INTRAVENOUS
  Administered 2021-02-28: 2 mg via INTRAVENOUS
  Filled 2021-02-26: qty 2
  Filled 2021-02-26: qty 1

## 2021-02-26 MED ORDER — CHLORDIAZEPOXIDE HCL 25 MG PO CAPS
50.0000 mg | ORAL_CAPSULE | Freq: Three times a day (TID) | ORAL | Status: DC
  Administered 2021-02-26 – 2021-02-27 (×5): 50 mg via ORAL
  Filled 2021-02-26 (×6): qty 2

## 2021-02-26 MED ORDER — LORAZEPAM 1 MG PO TABS
1.0000 mg | ORAL_TABLET | ORAL | Status: AC | PRN
  Administered 2021-02-26 – 2021-02-27 (×4): 4 mg via ORAL
  Filled 2021-02-26 (×4): qty 4

## 2021-02-26 MED ORDER — MORPHINE SULFATE (PF) 4 MG/ML IV SOLN
4.0000 mg | Freq: Three times a day (TID) | INTRAVENOUS | Status: DC | PRN
  Administered 2021-02-26 – 2021-02-28 (×4): 4 mg via INTRAVENOUS
  Filled 2021-02-26 (×5): qty 1

## 2021-02-26 MED ORDER — SODIUM CHLORIDE 0.9% IV SOLUTION: Freq: Once | INTRAVENOUS | Status: DC

## 2021-02-26 MED ORDER — POTASSIUM CHLORIDE CRYS ER 20 MEQ PO TBCR
40.0000 meq | EXTENDED_RELEASE_TABLET | ORAL | Status: DC
  Administered 2021-02-26: 40 meq via ORAL
  Filled 2021-02-26: qty 2

## 2021-02-26 MED ORDER — POLYETHYLENE GLYCOL 3350 17 G PO PACK
17.0000 g | PACK | Freq: Every day | ORAL | Status: DC
  Administered 2021-02-26 – 2021-03-01 (×3): 17 g via ORAL
  Filled 2021-02-26 (×4): qty 1

## 2021-02-26 MED ORDER — SENNA 8.6 MG PO TABS
1.0000 | ORAL_TABLET | Freq: Every day | ORAL | Status: DC
  Administered 2021-02-26 – 2021-03-01 (×4): 8.6 mg via ORAL
  Filled 2021-02-26 (×4): qty 1

## 2021-02-26 MED ORDER — SPIRITUS FRUMENTI
1.0000 | Freq: Two times a day (BID) | ORAL | Status: DC
  Administered 2021-02-26: 1 via ORAL
  Filled 2021-02-26 (×15): qty 1

## 2021-02-26 MED ORDER — POTASSIUM CHLORIDE CRYS ER 20 MEQ PO TBCR
40.0000 meq | EXTENDED_RELEASE_TABLET | ORAL | Status: AC
  Administered 2021-02-26 (×2): 40 meq via ORAL
  Filled 2021-02-26 (×2): qty 2
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
## 2021-02-26 NOTE — TOC Progression Note (Addendum)
Transition of Care [facility]) - Progression Note    Patient Details  Name: Dean Alvarado MRN: [id number] Date of Birth: 1955-11-09  Transition of Care [facility] CM/SW Contact  [name], RN Phone Number: 02/26/2021, 4:21 PM  Clinical Narrative:    Patient currently remains in intensive care unit; Precedex drip off, but still requiring Ativan over the last 24 hours.  TOC Case Manager will continue to follow/arrange home therapies and DME as ordered.  Patient currently on 3 to 5 L of oxygen; will follow for possible home O2.   Expected Discharge Plan: [place] Barriers to Discharge: Continued Medical Work up  Expected Discharge Plan and Services Expected Discharge Plan: [place]   Discharge Planning Services: CM Consult   Living arrangements for the past 2 months: Apartment                                       Social Determinants of Health (SDOH) Interventions    Readmission Risk Interventions No flowsheet data found.  Reinaldo Raddle, RN, BSN  Trauma/Neuro ICU Case Manager [phone]
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
## 2021-02-27 DIAGNOSIS — F05 Delirium due to known physiological condition: Secondary | ICD-10-CM | POA: Diagnosis not present

## 2021-02-27 DIAGNOSIS — R45851 Suicidal ideations: Secondary | ICD-10-CM | POA: Diagnosis not present

## 2021-02-27 LAB — GLUCOSE, CAPILLARY
Glucose-Capillary: 116 mg/dL — ABNORMAL HIGH (ref 70–99)
Glucose-Capillary: 119 mg/dL — ABNORMAL HIGH (ref 70–99)
Glucose-Capillary: 130 mg/dL — ABNORMAL HIGH (ref 70–99)
Glucose-Capillary: 132 mg/dL — ABNORMAL HIGH (ref 70–99)
Glucose-Capillary: 175 mg/dL — ABNORMAL HIGH (ref 70–99)
Glucose-Capillary: 221 mg/dL — ABNORMAL HIGH (ref 70–99)

## 2021-02-27 LAB — BPAM RBC
Blood Product Expiration Date: 202211192359
ISSUE DATE / TIME: 202211070521
Unit Type and Rh: 6200

## 2021-02-27 LAB — TYPE AND SCREEN
ABO/RH(D): A POS
Antibody Screen: NEGATIVE
Unit division: 0

## 2021-02-27 MED ORDER — OLANZAPINE 5 MG PO TABS
5.0000 mg | ORAL_TABLET | Freq: Every day | ORAL | Status: DC
Start: 2021-02-27 — End: 2021-02-28
  Administered 2021-02-27: 5 mg via ORAL
  Filled 2021-02-27: qty 1

## 2021-02-27 MED ORDER — OLANZAPINE 5 MG PO TABS
2.5000 mg | ORAL_TABLET | Freq: Four times a day (QID) | ORAL | Status: DC | PRN
  Administered 2021-03-01 – 2021-03-08 (×7): 2.5 mg via ORAL
  Filled 2021-02-27: qty 1
  Filled 2021-02-27: qty 0.5
  Filled 2021-02-27 (×6): qty 1
  Filled 2021-02-27: qty 0.5

## 2021-02-27 MED ORDER — OLANZAPINE 10 MG IM SOLR
2.5000 mg | Freq: Four times a day (QID) | INTRAMUSCULAR | Status: DC | PRN
  Administered 2021-02-27: 2.5 mg via INTRAMUSCULAR
  Filled 2021-02-27 (×2): qty 10
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
## 2021-02-28 DIAGNOSIS — F05 Delirium due to known physiological condition: Secondary | ICD-10-CM

## 2021-02-28 LAB — BASIC METABOLIC PANEL
Anion gap: 8 (ref 5–15)
BUN: 10 mg/dL (ref 8–23)
CO2: 26 mmol/L (ref 22–32)
Calcium: 8.3 mg/dL — ABNORMAL LOW (ref 8.9–10.3)
Chloride: 101 mmol/L (ref 98–111)
Creatinine, Ser: 0.84 mg/dL (ref 0.61–1.24)
GFR, Estimated: >60 mL/min (ref >60)
Glucose, Bld: 204 mg/dL — ABNORMAL HIGH (ref 70–99)
Potassium: 3.6 mmol/L (ref 3.5–5.1)
Sodium: 135 mmol/L (ref 135–145)

## 2021-02-28 LAB — GLUCOSE, CAPILLARY
Glucose-Capillary: 115 mg/dL — ABNORMAL HIGH (ref 70–99)
Glucose-Capillary: 221 mg/dL — ABNORMAL HIGH (ref 70–99)
Glucose-Capillary: 200 mg/dL — ABNORMAL HIGH (ref 70–99)
Glucose-Capillary: 148 mg/dL — ABNORMAL HIGH (ref 70–99)
Glucose-Capillary: 244 mg/dL — ABNORMAL HIGH (ref 70–99)
Glucose-Capillary: 217 mg/dL — ABNORMAL HIGH (ref 70–99)

## 2021-02-28 MED ORDER — CHLORDIAZEPOXIDE HCL 25 MG PO CAPS: 25.0000 mg | ORAL_CAPSULE | Freq: Three times a day (TID) | ORAL | Status: DC

## 2021-02-28 MED ORDER — OLANZAPINE 5 MG PO TABS
5.0000 mg | ORAL_TABLET | Freq: Every day | ORAL | Status: DC
  Administered 2021-02-28: 5 mg via ORAL
  Filled 2021-02-28: qty 1

## 2021-02-28 MED ORDER — CHLORDIAZEPOXIDE HCL 5 MG PO CAPS
15.0000 mg | ORAL_CAPSULE | Freq: Three times a day (TID) | ORAL | Status: DC
  Administered 2021-02-28 (×3): 15 mg via ORAL
  Filled 2021-02-28 (×3): qty 3
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
## 2021-02-28 NOTE — Progress Notes (Signed)
Patient was up in the chair from 700-900am today. When put to bed he stayed there until 1pm and PT got him to the chair and he stayed another hour from 1pm-2pm. Family left around 4pm and he started getting agitated and anxious to get out of the bed. He also started pulling off condom catheter and throwing things purposefully in the floor and calling out to sit on the bed pan. The 2mg  of ativan did not help when given.
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
## 2021-02-28 NOTE — Care Management Important Message (Signed)
Important Message  Patient Details  Name: Dean Alvarado MRN: [id number] Date of Birth: 1956-01-28   Medicare Important Message Given:  Yes     Orbie Pyo 02/28/2021, 2:41 PM
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
## 2021-02-28 NOTE — Plan of Care (Deleted)
Patient is being discharged to [facility]

## 2021-03-01 LAB — GLUCOSE, CAPILLARY
Glucose-Capillary: 103 mg/dL — ABNORMAL HIGH (ref 70–99)
Glucose-Capillary: 140 mg/dL — ABNORMAL HIGH (ref 70–99)
Glucose-Capillary: 168 mg/dL — ABNORMAL HIGH (ref 70–99)
Glucose-Capillary: 174 mg/dL — ABNORMAL HIGH (ref 70–99)
Glucose-Capillary: 212 mg/dL — ABNORMAL HIGH (ref 70–99)
Glucose-Capillary: 225 mg/dL — ABNORMAL HIGH (ref 70–99)
Glucose-Capillary: 299 mg/dL — ABNORMAL HIGH (ref 70–99)

## 2021-03-01 MED ORDER — OLANZAPINE 5 MG PO TABS
5.0000 mg | ORAL_TABLET | Freq: Every day | ORAL | Status: DC
  Administered 2021-03-01 – 2021-03-04 (×4): 5 mg via ORAL
  Filled 2021-03-01 (×3): qty 1

## 2021-03-01 MED ORDER — CHLORDIAZEPOXIDE HCL 5 MG PO CAPS
5.0000 mg | ORAL_CAPSULE | Freq: Three times a day (TID) | ORAL | Status: DC
  Administered 2021-03-01 (×2): 5 mg via ORAL
  Filled 2021-03-01 (×2): qty 1

## 2021-03-01 MED ORDER — CHLORDIAZEPOXIDE HCL 5 MG PO CAPS
10.0000 mg | ORAL_CAPSULE | Freq: Three times a day (TID) | ORAL | Status: DC
  Administered 2021-03-01 – 2021-03-02 (×5): 10 mg via ORAL
  Filled 2021-03-01 (×5): qty 2
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
## 2021-03-01 NOTE — TOC Progression Note (Signed)
Transition of Care [facility]) - Progression Note    Patient Details  Name: ROSHUN KLINGENSMITH MRN: [id number] Date of Birth: 03-10-1956  Transition of Care [facility]) CM/SW Contact  Oren Section Cleta Alberts, RN Phone Number: 03/01/2021, 4:53 PM  Clinical Narrative:    Submitted for PASSR today; completed FL2.  No bed offers from [facility]; will fax out to a larger surrounding area, in hopes of bed offer.  May be difficult to place due to ETOH use and agitation.  Will follow with updates as available.    Expected Discharge Plan: Skilled Nursing Facility Barriers to Discharge: [place] (PASRR), Insurance Authorization, SNF Pending bed offer  Expected Discharge Plan and Services Expected Discharge Plan: [place] In-house Referral: Clinical Social Work Discharge Planning Services: CM Consult   Living arrangements for the past 2 months: Apartment Expected Discharge Date: 03/01/21                                     Social Determinants of Health (SDOH) Interventions    Readmission Risk Interventions No flowsheet data found.  Reinaldo Raddle, RN, BSN  Trauma/Neuro ICU Case Manager [phone]

## 2021-03-01 NOTE — NC FL2 (Signed)
[place] LEVEL OF CARE SCREENING TOOL     IDENTIFICATION  Patient Name: Dean Alvarado Birthdate: 08/15/1955 Sex: male Admission Date (Current Location): 02/19/2021  [facility] and [place] Number:  Herbalist and Address:  [facility] [place], [place] [address] [place], [place] [address]      Provider Number: [id number]  Attending Physician Name and Address:  Md, Trauma, MD  Relative Name and Phone Number:  Shelley Pooley, daughter  [phone]    Current Level of Care: Hospital Recommended Level of Care: [place] Prior Approval Number:    Date Approved/Denied:   PASRR Number: Pending  Discharge Plan: SNF    Current Diagnoses: Patient Active Problem List   Diagnosis Date Noted   Delirium due to another medical condition    Rib fractures 02/21/2021   Multiple fractures of ribs, bilateral, initial encounter for closed fracture 02/19/2021   Type 2 diabetes mellitus with hyperglycemia ([place]) 06/19/2020   Symptomatic carotid artery stenosis 04/10/2017   Stenosis of left internal carotid artery    Transient visual loss of left eye    Amaurosis fugax of left eye    TIA (transient ischemic attack) 04/07/2017   Hyperlipidemia 04/27/2014   HTN (hypertension) 04/27/2014   Anxiety state 04/27/2014   Hyperglycemia 03/23/2013    Orientation RESPIRATION BLADDER Height & Weight     Self, Time, Situation, Place  Normal External catheter Weight: 99.4 kg Height:  5\' 8"  (172.7 cm)  BEHAVIORAL SYMPTOMS/MOOD NEUROLOGICAL BOWEL NUTRITION STATUS      Continent Diet (Regular, thin liquids)  AMBULATORY STATUS COMMUNICATION OF NEEDS Skin   Extensive Assist Verbally Normal                       Personal Care Assistance Level of Assistance  Bathing, Feeding, Dressing Bathing Assistance: Limited assistance Feeding assistance: Limited assistance Dressing Assistance: Limited assistance     Functional Limitations Info              [place]  PT (By licensed PT), OT (By licensed OT)     PT Frequency: 5x weekly OT Frequency: 5x weekly            Contractures Contractures Info: Not present    Additional Factors Info  Code Status, Allergies, Psychotropic, Insulin Sliding Scale Code Status Info: Full code Allergies Info: No Known Allergies Psychotropic Info: Zyprexa 2.5mg  q6h prn, Librium 10mg  TID, Prozac 20mg  QD Insulin Sliding Scale Info: Novolog 0-15 units q4h       Current Medications (03/01/2021):  This is the current hospital active medication list Current Facility-Administered Medications  Medication Dose Route Frequency Provider Last Rate Last Admin   0.9 %  sodium chloride infusion (Manually program via Guardrails IV Fluids)   Intravenous Once Kinsinger, Arta Bruce, MD       0.9 %  sodium chloride infusion   Intra-arterial PRN Jesusita Oka, MD   Stopped at 02/26/21 0542   acetaminophen (TYLENOL) tablet 1,000 mg  1,000 mg Oral Q6H Maczis, Barth Kirks, PA-C   1,000 mg at 03/01/21 1619   atorvastatin (LIPITOR) tablet 20 mg  20 mg Oral Daily Donnie Mesa, MD   20 mg at 03/01/21 0901   bisacodyl (DULCOLAX) suppository 10 mg  10 mg Rectal Daily Jesusita Oka, MD   10 mg at 03/01/21 1059   chlordiazePOXIDE (LIBRIUM) capsule 10 mg  10 mg Oral TID Saverio Danker, PA-C   10  mg at 03/01/21 1620   Chlorhexidine Gluconate Cloth 2 % PADS 6 each  6 each Topical Daily Judith Part, MD   6 each at 03/01/21 1059   docusate sodium (COLACE) capsule 100 mg  100 mg Oral BID Donnie Mesa, MD   100 mg at 03/01/21 0900   enoxaparin (LOVENOX) injection 40 mg  40 mg Subcutaneous Q12H Jesusita Oka, MD   40 mg at 03/01/21 0902   feeding supplement (BOOST / RESOURCE BREEZE) liquid 1 Container  1 Container Oral TID BM Georganna Skeans, MD   1 Container at 03/01/21 1336   FLUoxetine (PROZAC) capsule 20 mg  20 mg Oral Daily Donnie Mesa, MD   20 mg at 63/14/97 0263   folic acid (FOLVITE)  tablet 1 mg  1 mg Oral Daily Donnie Mesa, MD   1 mg at 03/01/21 0900   guaiFENesin (MUCINEX) 12 hr tablet 600 mg  600 mg Oral BID Jillyn Ledger, PA-C   600 mg at 03/01/21 0900   insulin aspart (novoLOG) injection 0-15 Units  0-15 Units Subcutaneous Q4H Jesusita Oka, MD   8 Units at 03/01/21 1621   ipratropium-albuterol (DUONEB) 0.5-2.5 (3) MG/3ML nebulizer solution 3 mL  3 mL Nebulization Q4H PRN Georganna Skeans, MD   3 mL at 03/01/21 0902   lidocaine (LIDODERM) 5 % 3 patch  3 patch Transdermal Q24H Jesusita Oka, MD   3 patch at 03/01/21 7858   methocarbamol (ROBAXIN) tablet 750 mg  750 mg Oral QID Jesusita Oka, MD   750 mg at 03/01/21 1619   multivitamin with minerals tablet 1 tablet  1 tablet Oral Daily Donnie Mesa, MD   1 tablet at 03/01/21 0900   nicotine (NICODERM CQ - dosed in mg/24 hours) patch 14 mg  14 mg Transdermal Daily Georganna Skeans, MD   14 mg at 03/01/21 0902   OLANZapine (ZYPREXA) injection 2.5 mg  2.5 mg Intramuscular Q6H PRN Damita Dunnings B, MD   2.5 mg at 02/27/21 2033   Or   OLANZapine (ZYPREXA) tablet 2.5 mg  2.5 mg Oral Q6H PRN Damita Dunnings B, MD   2.5 mg at 03/01/21 0344   OLANZapine (ZYPREXA) tablet 5 mg  5 mg Oral q1600 Damita Dunnings B, MD   5 mg at 03/01/21 1621   ondansetron (ZOFRAN-ODT) disintegrating tablet 4 mg  4 mg Oral Q6H PRN Donnie Mesa, MD       Or   ondansetron [facility]) injection 4 mg  4 mg Intravenous Q6H PRN Donnie Mesa, MD       oxyCODONE (Oxy IR/ROXICODONE) immediate release tablet 5-10 mg  5-10 mg Oral Q4H PRN Jillyn Ledger, PA-C   10 mg at 03/01/21 1335   polyethylene glycol (MIRALAX / GLYCOLAX) packet 17 g  17 g Oral Daily Jesusita Oka, MD   17 g at 03/01/21 8502   senna (SENOKOT) tablet 8.6 mg  1 tablet Oral Daily Jesusita Oka, MD   8.6 mg at 03/01/21 0900   spiritus frumenti (ethyl alcohol) solution 1 each  1 each Oral BID WC Jesusita Oka, MD   1 each at 02/26/21 1825   thiamine tablet 100 mg  100 mg Oral  Daily Donnie Mesa, MD   100 mg at 03/01/21 0901   Or   thiamine (B-1) injection 100 mg  100 mg Intravenous Daily Donnie Mesa, MD   100 mg at 02/20/21 1005   traMADol (ULTRAM) tablet 50 mg  50 mg  Oral Q6H Jesusita Oka, MD   50 mg at 03/01/21 1620     Discharge Medications: Please see discharge summary for a list of discharge medications.  Relevant Imaging Results:  Relevant Lab Results:   Additional Information SS # [id number]  Reinaldo Raddle, RN, BSN  Trauma/Neuro ICU Case Manager [phone]
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
## 2021-03-02 LAB — BASIC METABOLIC PANEL WITH GFR
Anion gap: 8 (ref 5–15)
BUN: 10 mg/dL (ref 8–23)
CO2: 25 mmol/L (ref 22–32)
Calcium: 8.6 mg/dL — ABNORMAL LOW (ref 8.9–10.3)
Chloride: 103 mmol/L (ref 98–111)
Creatinine, Ser: 0.8 mg/dL (ref 0.61–1.24)
GFR, Estimated: >60 mL/min (ref >60)
Glucose, Bld: 157 mg/dL — ABNORMAL HIGH (ref 70–99)
Potassium: 3.9 mmol/L (ref 3.5–5.1)
Sodium: 136 mmol/L (ref 135–145)

## 2021-03-02 LAB — GLUCOSE, CAPILLARY
Glucose-Capillary: 134 mg/dL — ABNORMAL HIGH (ref 70–99)
Glucose-Capillary: 146 mg/dL — ABNORMAL HIGH (ref 70–99)
Glucose-Capillary: 153 mg/dL — ABNORMAL HIGH (ref 70–99)
Glucose-Capillary: 154 mg/dL — ABNORMAL HIGH (ref 70–99)
Glucose-Capillary: 173 mg/dL — ABNORMAL HIGH (ref 70–99)
Glucose-Capillary: 222 mg/dL — ABNORMAL HIGH (ref 70–99)

## 2021-03-02 MED ORDER — POLYETHYLENE GLYCOL 3350 17 G PO PACK: 17.0000 g | PACK | Freq: Every day | ORAL | Status: DC | PRN

## 2021-03-02 MED ORDER — FLUOXETINE HCL 20 MG PO CAPS
30.0000 mg | ORAL_CAPSULE | Freq: Every day | ORAL | Status: DC
  Administered 2021-03-03 – 2021-03-08 (×6): 30 mg via ORAL
  Filled 2021-03-02 (×7): qty 1

## 2021-03-02 MED ORDER — LORAZEPAM 1 MG PO TABS
1.0000 mg | ORAL_TABLET | ORAL | Status: AC | PRN
  Administered 2021-03-02 – 2021-03-05 (×3): 2 mg via ORAL
  Filled 2021-03-02 (×3): qty 2

## 2021-03-02 MED ORDER — LORAZEPAM 2 MG/ML IJ SOLN: 1.0000 mg | INTRAMUSCULAR | Status: AC | PRN

## 2021-03-02 NOTE — TOC Progression Note (Signed)
Transition of Care [facility]) - Progression Note    Patient Details  Name: Dean Alvarado MRN: [id number] Date of Birth: 18-Nov-1955  Transition of [facility]) CM/SW Contact  [name], RN Phone Number: 03/02/2021, 4:25 PM  Clinical Narrative:    Pt currently has no SNF bed offers, likely due to his ETOH use and noted withdrawals. Patient much more alert and oriented today; feel patient may be a candidate for inpatient rehab, if he has the needed support at discharge.  I spoke with his daughter, Dean Alvarado; she states she would like to speak with the rest of the family to see if they can assist with 24h support at discharge. I have contracted to follow up with her on Monday. She is aware that there are no pending SNF bed offers.     Expected Discharge Plan: Skilled Nursing Facility Barriers to Discharge: [place] (PASRR), Insurance Authorization, SNF Pending bed offer  Expected Discharge Plan and Services Expected Discharge Plan: [place] In-house Referral: Clinical Social Work Discharge Planning Services: CM Consult   Living arrangements for the past 2 months: Apartment Expected Discharge Date: 03/01/21                                     Social Determinants of Health (SDOH) Interventions    Readmission Risk Interventions No flowsheet data found.  Reinaldo Raddle, RN, BSN  Trauma/Neuro ICU Case Manager [phone]
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
## 2021-03-03 LAB — GLUCOSE, CAPILLARY
Glucose-Capillary: 139 mg/dL — ABNORMAL HIGH (ref 70–99)
Glucose-Capillary: 143 mg/dL — ABNORMAL HIGH (ref 70–99)
Glucose-Capillary: 184 mg/dL — ABNORMAL HIGH (ref 70–99)
Glucose-Capillary: 143 mg/dL — ABNORMAL HIGH (ref 70–99)
Glucose-Capillary: 233 mg/dL — ABNORMAL HIGH (ref 70–99)

## 2021-03-03 MED ORDER — CHLORDIAZEPOXIDE HCL 5 MG PO CAPS
5.0000 mg | ORAL_CAPSULE | Freq: Three times a day (TID) | ORAL | Status: DC
  Administered 2021-03-03 – 2021-03-04 (×6): 5 mg via ORAL
  Filled 2021-03-03 (×7): qty 1

## 2021-03-03 MED ORDER — FOOD THICKENER (SIMPLYTHICK): 1.0000 | ORAL | Status: DC | PRN

## 2021-03-03 NOTE — Plan of Care (Signed)
  Problem: Education: Goal: Knowledge of General Education information will improve Description Including pain rating scale, medication(s)/side effects and non-pharmacologic comfort measures Outcome: Progressing   Problem: Health Behavior/Discharge Planning: Goal: Ability to manage health-related needs will improve Outcome: Progressing   

## 2021-03-03 NOTE — Plan of Care (Signed)
  Problem: Education: Goal: Knowledge of General Education information will improve Description: Including pain rating scale, medication(s)/side effects and non-pharmacologic comfort measures 03/03/2021 1928 by Emmaline Life, RN Outcome: Progressing 03/03/2021 1915 by Emmaline Life, RN Outcome: Progressing   Problem: Health Behavior/Discharge Planning: Goal: Ability to manage health-related needs will improve 03/03/2021 1928 by Emmaline Life, RN Outcome: Progressing 03/03/2021 1915 by Emmaline Life, RN Outcome: Progressing
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
## 2021-03-04 LAB — GLUCOSE, CAPILLARY
Glucose-Capillary: 122 mg/dL — ABNORMAL HIGH (ref 70–99)
Glucose-Capillary: 149 mg/dL — ABNORMAL HIGH (ref 70–99)
Glucose-Capillary: 161 mg/dL — ABNORMAL HIGH (ref 70–99)
Glucose-Capillary: 168 mg/dL — ABNORMAL HIGH (ref 70–99)
Glucose-Capillary: 174 mg/dL — ABNORMAL HIGH (ref 70–99)
Glucose-Capillary: 181 mg/dL — ABNORMAL HIGH (ref 70–99)
Glucose-Capillary: 196 mg/dL — ABNORMAL HIGH (ref 70–99)

## 2021-03-04 MED ORDER — METHOCARBAMOL 500 MG PO TABS
1000.0000 mg | ORAL_TABLET | Freq: Four times a day (QID) | ORAL | Status: DC
  Administered 2021-03-04 – 2021-03-08 (×19): 1000 mg via ORAL
  Filled 2021-03-04 (×19): qty 2

## 2021-03-04 NOTE — Progress Notes (Signed)
Pt stated that he does not want a PIV at this time because "he's probably going home tomorrow and he's had a lot of trouble with his IVs." Nurse made aware and advised to put a consult in if he needs a PIV later.

## 2021-03-04 NOTE — Plan of Care (Signed)
  Problem: Education: Goal: Knowledge of General Education information will improve Description Including pain rating scale, medication(s)/side effects and non-pharmacologic comfort measures Outcome: Progressing   
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
## 2021-03-05 ENCOUNTER — Inpatient Hospital Stay (HOSPITAL_COMMUNITY): Payer: Medicare HMO

## 2021-03-05 ENCOUNTER — Telehealth: Payer: Self-pay | Admitting: Family Medicine

## 2021-03-05 DIAGNOSIS — I808 Phlebitis and thrombophlebitis of other sites: Secondary | ICD-10-CM | POA: Diagnosis not present

## 2021-03-05 DIAGNOSIS — F10188 Alcohol abuse with other alcohol-induced disorder: Secondary | ICD-10-CM

## 2021-03-05 LAB — CBC
HCT: 30.9 % — ABNORMAL LOW (ref 39.0–52.0)
Hemoglobin: 9.8 g/dL — ABNORMAL LOW (ref 13.0–17.0)
MCH: 30.9 pg (ref 26.0–34.0)
MCHC: 31.7 g/dL (ref 30.0–36.0)
MCV: 97.5 fL (ref 80.0–100.0)
Platelets: 579 10*3/uL — ABNORMAL HIGH (ref 150–400)
RBC: 3.17 MIL/uL — ABNORMAL LOW (ref 4.22–5.81)
RDW: 15.6 % — ABNORMAL HIGH (ref 11.5–15.5)
WBC: 6.1 10*3/uL (ref 4.0–10.5)
nRBC: 0 % (ref 0.0–0.2)

## 2021-03-05 LAB — GLUCOSE, CAPILLARY
Glucose-Capillary: 128 mg/dL — ABNORMAL HIGH (ref 70–99)
Glucose-Capillary: 144 mg/dL — ABNORMAL HIGH (ref 70–99)
Glucose-Capillary: 140 mg/dL — ABNORMAL HIGH (ref 70–99)
Glucose-Capillary: 203 mg/dL — ABNORMAL HIGH (ref 70–99)
Glucose-Capillary: 170 mg/dL — ABNORMAL HIGH (ref 70–99)

## 2021-03-05 MED ORDER — CHLORDIAZEPOXIDE HCL 5 MG PO CAPS
5.0000 mg | ORAL_CAPSULE | Freq: Two times a day (BID) | ORAL | Status: DC
  Administered 2021-03-05 – 2021-03-06 (×4): 5 mg via ORAL
  Filled 2021-03-05 (×3): qty 1

## 2021-03-05 MED ORDER — PANTOPRAZOLE SODIUM 40 MG PO TBEC
40.0000 mg | DELAYED_RELEASE_TABLET | Freq: Every day | ORAL | Status: DC
  Administered 2021-03-05 – 2021-03-08 (×4): 40 mg via ORAL
  Filled 2021-03-05 (×4): qty 1

## 2021-03-05 MED ORDER — IBUPROFEN 200 MG PO TABS
600.0000 mg | ORAL_TABLET | Freq: Three times a day (TID) | ORAL | Status: AC
  Administered 2021-03-05 – 2021-03-06 (×6): 600 mg via ORAL
  Filled 2021-03-05 (×6): qty 3

## 2021-03-05 MED ORDER — OLANZAPINE 5 MG PO TABS
2.5000 mg | ORAL_TABLET | Freq: Every day | ORAL | Status: DC
  Administered 2021-03-05 – 2021-03-08 (×4): 2.5 mg via ORAL
  Filled 2021-03-05 (×4): qty 1

## 2021-03-05 NOTE — Telephone Encounter (Signed)
FYI
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
## 2021-03-05 NOTE — Progress Notes (Signed)
SLP Cancellation Note  Patient Details Name: Dean Alvarado MRN: [id number] DOB: 09-28-1955   Cancelled treatment:       Reason Eval/Treat Not Completed: Unable to assess pt readiness for advanced liquids, as he is currently NPO for testing today. Will continue efforts.   Kordelia Severin B. Quentin Ore, [facility], [place] Speech Language Pathologist Office: [phone]  Shonna Chock 03/05/2021, 11:19 AM

## 2021-03-05 NOTE — Progress Notes (Signed)
Inpatient Rehabilitation Admissions Coordinator   I spoke with pt's daughter, Lorre Nick, by phone. We had previously discussed caregiver supports last week with she and her Aunt from [place]., They are unable to provide the needed caregiver supports after CIR admit and they continue to request SNF level rehab. I have updated acute team and TOC, Almyra Free and will not pursue a Cir admit at this time.  Danne Baxter, RN, MSN Rehab Admissions Coordinator [phone] 03/05/2021 8:18 AM

## 2021-03-05 NOTE — TOC Progression Note (Signed)
Transition of Care [facility]) - Progression Note    Patient Details  Name: Dean Alvarado MRN: [id number] Date of Birth: 1955-12-01  Transition of Care [facility]) CM/SW Contact  [name], RN Phone Number: 03/05/2021, 4:12 PM  Clinical Narrative:    Currently still no bed offers for patient at skilled nursing facility.  Met with patient and sister to discuss discharge planning: Sister states that family prefers SNF, as they feel he will need a longer rehab stay then CIR.  They do feel he needs some sort of rehab, and are willing to consider inpatient rehab if SNF unavailable.  I have sent a referral to [place].  Sister is from [place] and plans to return to [place] tomorrow; she states that patient's daughter and family are unable to provide 24-hour supervision at discharge due to work schedule.  Notified today that patient will require a Level II PASSR number; will follow with [facility] to obtain ASAP.   Addendum: 4:16 PM Left messages with multiple skilled nursing facilities in the area, requesting review of initial referral.  Will follow with updates as available.   4:27pm [facility] has accepted patient for admission pending PASSR number and insurance authorization.  Updated daughter, Lorre Nick, on bed offer. She will take a look at facility online; will follow up with daughter in AM.    Expected Discharge Plan: Skilled Nursing Facility Barriers to Discharge: [place] (PASRR), Insurance Authorization, SNF Pending bed offer  Expected Discharge Plan and Services Expected Discharge Plan: [place] In-house Referral: Clinical Social Work Discharge Planning Services: CM Consult   Living arrangements for the past 2 months: Apartment Expected Discharge Date: 03/01/21                                     Social Determinants of Health (SDOH) Interventions    Readmission Risk Interventions No flowsheet data found.  Reinaldo Raddle, RN, BSN   Trauma/Neuro ICU Case Manager [phone]
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
## 2021-03-05 NOTE — Progress Notes (Signed)
Right upper extremity venous duplex has been completed. Preliminary results can be found in CV Proc through chart review.  Results were given to the patient's nurse, Heather.  03/05/21 2:17 PM Carlos Levering RVT
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
## 2021-03-06 LAB — BASIC METABOLIC PANEL
Anion gap: 7 (ref 5–15)
BUN: 16 mg/dL (ref 8–23)
CO2: 24 mmol/L (ref 22–32)
Calcium: 8.4 mg/dL — ABNORMAL LOW (ref 8.9–10.3)
Chloride: 106 mmol/L (ref 98–111)
Creatinine, Ser: 0.78 mg/dL (ref 0.61–1.24)
GFR, Estimated: >60 mL/min (ref >60)
Glucose, Bld: 133 mg/dL — ABNORMAL HIGH (ref 70–99)
Potassium: 4 mmol/L (ref 3.5–5.1)
Sodium: 137 mmol/L (ref 135–145)

## 2021-03-06 LAB — GLUCOSE, CAPILLARY
Glucose-Capillary: 128 mg/dL — ABNORMAL HIGH (ref 70–99)
Glucose-Capillary: 141 mg/dL — ABNORMAL HIGH (ref 70–99)
Glucose-Capillary: 146 mg/dL — ABNORMAL HIGH (ref 70–99)
Glucose-Capillary: 185 mg/dL — ABNORMAL HIGH (ref 70–99)
Glucose-Capillary: 194 mg/dL — ABNORMAL HIGH (ref 70–99)
Glucose-Capillary: 194 mg/dL — ABNORMAL HIGH (ref 70–99)
Glucose-Capillary: 239 mg/dL — ABNORMAL HIGH (ref 70–99)

## 2021-03-06 NOTE — Progress Notes (Signed)
Patient reporting worsening back pain today, states increases with mobility. Reports that oxycodone is not effective for pain, requesting dilaudid. Pain 1/10 at time of TRN rounding.
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
## 2021-03-06 NOTE — TOC Progression Note (Signed)
Transition of Care [facility]) - Progression Note    Patient Details  Name: Dean Alvarado MRN: [id number] Date of Birth: 10/24/1955  [facility] CM/SW Contact  [name], RN Phone Number: 03/06/2021, 1000am  Clinical Narrative:    Patient and family pleased with [facility] for SNF bed offer. Facility to start insurance authorization, as patient is not managed by [employer].   Will continue to await Level II PASSR number; will follow with [facility].    Expected Discharge Plan: Skilled Nursing Facility Barriers to Discharge: [place] (PASRR), Insurance Authorization, SNF Pending bed offer  Expected Discharge Plan and Services Expected Discharge Plan: [place] [place] In-house Referral: Clinical Social Work Discharge Planning Services: CM Consult   Living arrangements for the past 2 months: Apartment Expected Discharge Date: 03/01/21                                     Social Determinants of Health (SDOH) Interventions    Readmission Risk Interventions No flowsheet data found.  Reinaldo Raddle, RN, BSN  Trauma/Neuro ICU Case Manager [phone]

## 2021-03-06 NOTE — Plan of Care (Signed)

## 2021-03-07 LAB — GLUCOSE, CAPILLARY
Glucose-Capillary: 107 mg/dL — ABNORMAL HIGH (ref 70–99)
Glucose-Capillary: 146 mg/dL — ABNORMAL HIGH (ref 70–99)
Glucose-Capillary: 146 mg/dL — ABNORMAL HIGH (ref 70–99)
Glucose-Capillary: 154 mg/dL — ABNORMAL HIGH (ref 70–99)
Glucose-Capillary: 174 mg/dL — ABNORMAL HIGH (ref 70–99)
Glucose-Capillary: 241 mg/dL — ABNORMAL HIGH (ref 70–99)

## 2021-03-07 LAB — HEMOGLOBIN A1C
Hgb A1c MFr Bld: 5.7 % — ABNORMAL HIGH (ref 4.8–5.6)
Mean Plasma Glucose: 116.89 mg/dL

## 2021-03-07 MED ORDER — ALUM & MAG HYDROXIDE-SIMETH 200-200-20 MG/5ML PO SUSP
30.0000 mL | Freq: Four times a day (QID) | ORAL | Status: DC | PRN
  Administered 2021-03-07 (×2): 30 mL via ORAL
  Filled 2021-03-07 (×3): qty 30

## 2021-03-07 MED ORDER — CHLORDIAZEPOXIDE HCL 5 MG PO CAPS
5.0000 mg | ORAL_CAPSULE | Freq: Every day | ORAL | Status: AC
  Administered 2021-03-07 – 2021-03-08 (×2): 5 mg via ORAL
  Filled 2021-03-07 (×2): qty 1
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
## 2021-03-07 NOTE — Progress Notes (Signed)
Patient transferred to [facility] room 28 via wheelchair.  No changes noted in assessment.  All belongings accounted for by patient.

## 2021-03-07 NOTE — Plan of Care (Signed)
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
## 2021-03-08 ENCOUNTER — Encounter: Payer: Self-pay | Admitting: Family Medicine

## 2021-03-08 DIAGNOSIS — M6281 Muscle weakness (generalized): Secondary | ICD-10-CM | POA: Diagnosis not present

## 2021-03-08 DIAGNOSIS — F1320 Sedative, hypnotic or anxiolytic dependence, uncomplicated: Secondary | ICD-10-CM | POA: Diagnosis not present

## 2021-03-08 DIAGNOSIS — F1020 Alcohol dependence, uncomplicated: Secondary | ICD-10-CM | POA: Diagnosis not present

## 2021-03-08 DIAGNOSIS — S22089A Unspecified fracture of T11-T12 vertebra, initial encounter for closed fracture: Secondary | ICD-10-CM | POA: Diagnosis not present

## 2021-03-08 DIAGNOSIS — Z7401 Bed confinement status: Secondary | ICD-10-CM | POA: Diagnosis not present

## 2021-03-08 DIAGNOSIS — M2550 Pain in unspecified joint: Secondary | ICD-10-CM | POA: Diagnosis not present

## 2021-03-08 DIAGNOSIS — D62 Acute posthemorrhagic anemia: Secondary | ICD-10-CM | POA: Diagnosis not present

## 2021-03-08 DIAGNOSIS — S2221XA Fracture of manubrium, initial encounter for closed fracture: Secondary | ICD-10-CM | POA: Diagnosis not present

## 2021-03-08 DIAGNOSIS — R1011 Right upper quadrant pain: Secondary | ICD-10-CM | POA: Diagnosis not present

## 2021-03-08 DIAGNOSIS — S22089D Unspecified fracture of T11-T12 vertebra, subsequent encounter for fracture with routine healing: Secondary | ICD-10-CM | POA: Diagnosis not present

## 2021-03-08 DIAGNOSIS — S32009D Unspecified fracture of unspecified lumbar vertebra, subsequent encounter for fracture with routine healing: Secondary | ICD-10-CM | POA: Diagnosis not present

## 2021-03-08 DIAGNOSIS — J9601 Acute respiratory failure with hypoxia: Secondary | ICD-10-CM | POA: Diagnosis not present

## 2021-03-08 DIAGNOSIS — S2243XA Multiple fractures of ribs, bilateral, initial encounter for closed fracture: Secondary | ICD-10-CM | POA: Diagnosis not present

## 2021-03-08 DIAGNOSIS — R262 Difficulty in walking, not elsewhere classified: Secondary | ICD-10-CM | POA: Diagnosis not present

## 2021-03-08 DIAGNOSIS — S27322A Contusion of lung, bilateral, initial encounter: Secondary | ICD-10-CM | POA: Diagnosis not present

## 2021-03-08 DIAGNOSIS — S270XXA Traumatic pneumothorax, initial encounter: Secondary | ICD-10-CM | POA: Diagnosis not present

## 2021-03-08 DIAGNOSIS — F17201 Nicotine dependence, unspecified, in remission: Secondary | ICD-10-CM | POA: Diagnosis not present

## 2021-03-08 DIAGNOSIS — F39 Unspecified mood [affective] disorder: Secondary | ICD-10-CM | POA: Diagnosis not present

## 2021-03-08 DIAGNOSIS — S2243XD Multiple fractures of ribs, bilateral, subsequent encounter for fracture with routine healing: Secondary | ICD-10-CM | POA: Diagnosis not present

## 2021-03-08 DIAGNOSIS — I739 Peripheral vascular disease, unspecified: Secondary | ICD-10-CM | POA: Diagnosis not present

## 2021-03-08 DIAGNOSIS — S32009A Unspecified fracture of unspecified lumbar vertebra, initial encounter for closed fracture: Secondary | ICD-10-CM | POA: Diagnosis not present

## 2021-03-08 DIAGNOSIS — R2689 Other abnormalities of gait and mobility: Secondary | ICD-10-CM | POA: Diagnosis not present

## 2021-03-08 DIAGNOSIS — K5900 Constipation, unspecified: Secondary | ICD-10-CM | POA: Diagnosis not present

## 2021-03-08 DIAGNOSIS — S2249XA Multiple fractures of ribs, unspecified side, initial encounter for closed fracture: Secondary | ICD-10-CM | POA: Diagnosis not present

## 2021-03-08 DIAGNOSIS — S32019A Unspecified fracture of first lumbar vertebra, initial encounter for closed fracture: Secondary | ICD-10-CM | POA: Diagnosis not present

## 2021-03-08 LAB — GLUCOSE, CAPILLARY
Glucose-Capillary: 125 mg/dL — ABNORMAL HIGH (ref 70–99)
Glucose-Capillary: 161 mg/dL — ABNORMAL HIGH (ref 70–99)
Glucose-Capillary: 215 mg/dL — ABNORMAL HIGH (ref 70–99)
Glucose-Capillary: 126 mg/dL — ABNORMAL HIGH (ref 70–99)
Glucose-Capillary: 249 mg/dL — ABNORMAL HIGH (ref 70–99)

## 2021-03-08 LAB — RESP PANEL BY RT-PCR (FLU A&B, COVID) ARPGX2
Influenza A by PCR: NEGATIVE
Influenza B by PCR: NEGATIVE
SARS Coronavirus 2 by RT PCR: NEGATIVE

## 2021-03-08 MED ORDER — FLUOXETINE HCL 10 MG PO CAPS: 30.0000 mg | ORAL_CAPSULE | Freq: Every day | ORAL | 0 refills | Status: DC

## 2021-03-08 MED ORDER — TRAMADOL HCL 50 MG PO TABS: 50.0000 mg | ORAL_TABLET | Freq: Four times a day (QID) | ORAL | 0 refills | Status: DC

## 2021-03-08 MED ORDER — METHOCARBAMOL 500 MG PO TABS
1000.0000 mg | ORAL_TABLET | Freq: Four times a day (QID) | ORAL | Status: DC | PRN
Start: 2021-03-08 — End: 2022-06-26

## 2021-03-08 MED ORDER — ACETAMINOPHEN 500 MG PO TABS: 1000.0000 mg | ORAL_TABLET | Freq: Four times a day (QID) | ORAL | 0 refills | Status: DC

## 2021-03-08 MED ORDER — OXYCODONE HCL 5 MG PO TABS: 5.0000 mg | ORAL_TABLET | Freq: Four times a day (QID) | ORAL | 0 refills | Status: DC | PRN

## 2021-03-08 NOTE — Plan of Care (Signed)
  Problem: Education: Goal: Knowledge of General Education information will improve Description Including pain rating scale, medication(s)/side effects and non-pharmacologic comfort measures Outcome: Completed/Met   Problem: Health Behavior/Discharge Planning: Goal: Ability to manage health-related needs will improve Outcome: Completed/Met   Problem: Clinical Measurements: Goal: Ability to maintain clinical measurements within normal limits will improve Outcome: Completed/Met Goal: Will remain free from infection Outcome: Completed/Met Goal: Diagnostic test results will improve Outcome: Completed/Met   Problem: Activity: Goal: Risk for activity intolerance will decrease Outcome: Completed/Met   Problem: Nutrition: Goal: Adequate nutrition will be maintained Outcome: Completed/Met   Problem: Coping: Goal: Level of anxiety will decrease Outcome: Completed/Met   Problem: Elimination: Goal: Will not experience complications related to bowel motility Outcome: Completed/Met Goal: Will not experience complications related to urinary retention Outcome: Completed/Met   Problem: Pain Managment: Goal: General experience of comfort will improve Outcome: Completed/Met   Problem: Safety: Goal: Ability to remain free from injury will improve Outcome: Completed/Met   Problem: Skin Integrity: Goal: Risk for impaired skin integrity will decrease Outcome: Completed/Met   

## 2021-03-08 NOTE — TOC Progression Note (Addendum)
Transition of Care [facility]) - Progression Note    Patient Details  Name: Dean Alvarado MRN: [id number] Date of Birth: 1955-04-30  Transition of [facility]) CM/SW Contact  [name], RN Phone Number: 03/08/2021, 10:03 AM  Clinical Narrative:    Insurance authorization received by facility this morning, and Level II PASSR number has been assigned.  Will plan for dc later today, pending dc summary and order.    Expected Discharge Plan: Skilled Nursing Facility Barriers to Discharge: [place] (PASRR), Insurance Authorization, SNF Pending bed offer  Expected Discharge Plan and Services Expected Discharge Plan: [place] In-house Referral: Clinical Social Work Discharge Planning Services: CM Consult   Living arrangements for the past 2 months: Apartment Expected Discharge Date: 03/01/21                                     Social Determinants of Health (SDOH) Interventions    Readmission Risk Interventions No flowsheet data found.  Reinaldo Raddle, RN, BSN  Trauma/Neuro ICU Case Manager [phone]

## 2021-03-08 NOTE — Plan of Care (Signed)
?  Problem: Education: ?Goal: Knowledge of General Education information will improve ?Description: Including pain rating scale, medication(s)/side effects and non-pharmacologic comfort measures ?Outcome: Progressing ?  ?Problem: Health Behavior/Discharge Planning: ?Goal: Ability to manage health-related needs will improve ?Outcome: Progressing ?  ?Problem: Clinical Measurements: ?Goal: Ability to maintain clinical measurements within normal limits will improve ?Outcome: Progressing ?Goal: Will remain free from infection ?Outcome: Progressing ?Goal: Diagnostic test results will improve ?Outcome: Progressing ?  ?Problem: Activity: ?Goal: Risk for activity intolerance will decrease ?Outcome: Progressing ?  ?Problem: Nutrition: ?Goal: Adequate nutrition will be maintained ?Outcome: Progressing ?  ?Problem: Coping: ?Goal: Level of anxiety will decrease ?Outcome: Progressing ?  ?Problem: Elimination: ?Goal: Will not experience complications related to bowel motility ?Outcome: Progressing ?Goal: Will not experience complications related to urinary retention ?Outcome: Progressing ?  ?Problem: Pain Managment: ?Goal: General experience of comfort will improve ?Outcome: Progressing ?  ?

## 2021-03-08 NOTE — Progress Notes (Signed)
[name] to be D/C'd  per MD order.  Discussed with the patient and all questions fully answered.  VSS, Skin clean, dry and intact without evidence of skin break down, no evidence of skin tears noted.  IV catheter discontinued intact. Site without signs and symptoms of complications. Dressing and pressure applied.  An After Visit Summary was printed and given to the patient. Patient received prescriptions.  D/c education completed with patient/family including follow up instructions, medication list, d/c activities limitations if indicated, with other d/c instructions as indicated by MD - patient able to verbalize understanding, all questions fully answered.   Patient instructed to return to ED, call 911, or call MD for any changes in condition.   Patient to be escorted via PTAR to receiving facility.

## 2021-03-08 NOTE — TOC Transition Note (Signed)
Transition of Care [facility]) - CM/SW Discharge Note   Patient Details  Name: Dean Alvarado MRN: [id number] Date of Birth: [birth year]  Transition of Care [facility]) CM/SW Contact:  [name], RN Phone Number: 03/08/2021, 1:35 PM   Clinical Narrative:    Pt medically stable for discharge to SNF today.  Notified patient; he states he has notified his daughter.  Discharge summary sent to [facility]; rapid Covid test negative.  Bedside nurse to call report to [phone]; patient going to room 1202P.  Called for PTAR transport at 1:48pm.    Final next level of care: [place] Barriers to Discharge: Barriers Resolved   Patient Goals and CMS Choice Patient states their goals for this hospitalization and ongoing recovery are:: to go home CMS Medicare.gov Compare Post Acute Care list provided to:: Patient Choice offered to / list presented to : Patient  Discharge Placement PASRR number recieved: 03/08/21            Patient chooses bed at: [facility] Patient to be transferred to facility by: [place] Name of family member notified: Lorre Nick, daughter Patient and family notified of of transfer: 03/08/21  Discharge Plan and Services In-house Referral: Clinical Social Work Discharge Planning Services: [employer] Consult Post Acute Care Choice: [place]                               Social Determinants of Health (SDOH) Interventions     Readmission Risk Interventions No flowsheet data found.  Reinaldo Raddle, RN, BSN  Trauma/Neuro ICU Case Manager [phone]
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
## 2021-03-08 NOTE — NC FL2 (Addendum)
[place] LEVEL OF CARE SCREENING TOOL     IDENTIFICATION  Patient Name: Dean Alvarado Birthdate: 06-07-1955 Sex: male Admission Date (Current Location): 02/19/2021  [facility] and [place] Number:  Herbalist and Address:  [facility] [place] [address], [place], [place] [address]      Provider Number: [id number]  Attending Physician Name and Address:  Md, Trauma, MD  Relative Name and Phone Number:  Dean Alvarado, daughter  [phone]    Current Level of Care: Hospital Recommended Level of Care: [address] [place] Prior Approval Number:    Date Approved/Denied:   PASRR Number: [id number]  Discharge Plan: SNF    Current Diagnoses: Patient Active Problem List   Diagnosis Date Noted   Delirium due to another medical condition    Rib fractures 02/21/2021   Multiple fractures of ribs, bilateral, initial encounter for closed fracture 02/19/2021   Type 2 diabetes mellitus with hyperglycemia ([place]) 06/19/2020   Symptomatic carotid artery stenosis 04/10/2017   Stenosis of left internal carotid artery    Transient visual loss of left eye    Amaurosis fugax of left eye    TIA (transient ischemic attack) 04/07/2017   Hyperlipidemia 04/27/2014   HTN (hypertension) 04/27/2014   Anxiety state 04/27/2014   Hyperglycemia 03/23/2013    Orientation RESPIRATION BLADDER Height & Weight     Self, Time, Situation, Place  Normal External catheter Weight: 99.4 kg Height:  5\' 8"  (172.7 cm)  BEHAVIORAL SYMPTOMS/MOOD NEUROLOGICAL BOWEL NUTRITION STATUS      Continent Diet (Regular, thin liquids)  AMBULATORY STATUS COMMUNICATION OF NEEDS Skin   Extensive Assist Verbally Normal                       Personal Care Assistance Level of Assistance  Bathing, Feeding, Dressing Bathing Assistance: Limited assistance Feeding assistance: Limited assistance Dressing Assistance: Limited assistance     Functional Limitations Info              [place]  PT (By licensed PT), OT (By licensed OT)     PT Frequency: 5x weekly OT Frequency: 5x weekly            Contractures Contractures Info: Not present    Additional Factors Info  Code Status, Allergies, Psychotropic, Insulin Sliding Scale Code Status Info: Full code Allergies Info: No Known Allergies Psychotropic Info: Zyprexa 2.5mg  q6h prn, Librium 10mg  TID, Prozac 20mg  QD Insulin Sliding Scale Info: Novolog 0-15 units q4h       Current Medications (03/08/2021):  This is the current hospital active medication list Current Facility-Administered Medications  Medication Dose Route Frequency Provider Last Rate Last Admin   0.9 %  sodium chloride infusion (Manually program via Guardrails IV Fluids)   Intravenous Once Kinsinger, Arta Bruce, MD       0.9 %  sodium chloride infusion   Intra-arterial PRN Jesusita Oka, MD   Stopped at 02/26/21 0542   acetaminophen (TYLENOL) tablet 1,000 mg  1,000 mg Oral Q6H MaczisBarth Kirks, PA-C   1,000 mg at 03/08/21 0940   alum & mag hydroxide-simeth (MAALOX/MYLANTA) 200-200-20 MG/5ML suspension 30 mL  30 mL Oral Q6H PRN Saverio Danker, PA-C   30 mL at 03/07/21 2022   atorvastatin (LIPITOR) tablet 20 mg  20 mg Oral Daily Donnie Mesa, MD   20 mg at 03/08/21 0940   Chlorhexidine Gluconate Cloth 2 % PADS 6 each  6 each  Topical Daily Judith Part, MD   6 each at 03/08/21 0941   docusate sodium (COLACE) capsule 100 mg  100 mg Oral BID Donnie Mesa, MD   100 mg at 03/08/21 0939   enoxaparin (LOVENOX) injection 40 mg  40 mg Subcutaneous Q12H Jesusita Oka, MD   40 mg at 03/08/21 9983   feeding supplement (BOOST / RESOURCE BREEZE) liquid 1 Container  1 Container Oral TID BM Georganna Skeans, MD   1 Container at 03/08/21 0941   FLUoxetine (PROZAC) capsule 30 mg  30 mg Oral Daily Damita Dunnings B, MD   30 mg at 38/25/05 3976   folic acid (FOLVITE) tablet 1 mg  1 mg Oral Daily Donnie Mesa, MD   1 mg at  03/08/21 0940   food thickener (SIMPLYTHICK (NECTAR/LEVEL 2/MILDLY THICK)) 1 packet  1 packet Oral PRN Jesusita Oka, MD       guaiFENesin (MUCINEX) 12 hr tablet 600 mg  600 mg Oral BID Jillyn Ledger, PA-C   600 mg at 03/08/21 0940   insulin aspart (novoLOG) injection 0-15 Units  0-15 Units Subcutaneous Q4H Jesusita Oka, MD   3 Units at 03/08/21 0821   ipratropium-albuterol (DUONEB) 0.5-2.5 (3) MG/3ML nebulizer solution 3 mL  3 mL Nebulization Q4H PRN Georganna Skeans, MD   3 mL at 03/01/21 0902   lidocaine (LIDODERM) 5 % 3 patch  3 patch Transdermal Q24H Jesusita Oka, MD   1 patch at 03/08/21 0941   methocarbamol (ROBAXIN) tablet 1,000 mg  1,000 mg Oral QID Norm Parcel, PA-C   1,000 mg at 03/08/21 0940   multivitamin with minerals tablet 1 tablet  1 tablet Oral Daily Donnie Mesa, MD   1 tablet at 03/08/21 0940   nicotine (NICODERM CQ - dosed in mg/24 hours) patch 14 mg  14 mg Transdermal Daily Georganna Skeans, MD   14 mg at 03/07/21 0848   OLANZapine (ZYPREXA) injection 2.5 mg  2.5 mg Intramuscular Q6H PRN Damita Dunnings B, MD   2.5 mg at 02/27/21 2033   Or   OLANZapine (ZYPREXA) tablet 2.5 mg  2.5 mg Oral Q6H PRN Damita Dunnings B, MD   2.5 mg at 03/08/21 0312   OLANZapine (ZYPREXA) tablet 2.5 mg  2.5 mg Oral q1600 Damita Dunnings B, MD   2.5 mg at 03/07/21 1516   ondansetron (ZOFRAN-ODT) disintegrating tablet 4 mg  4 mg Oral Q6H PRN Donnie Mesa, MD       Or   ondansetron [facility]) injection 4 mg  4 mg Intravenous Q6H PRN Donnie Mesa, MD       oxyCODONE (Oxy IR/ROXICODONE) immediate release tablet 5-10 mg  5-10 mg Oral Q4H PRN Jillyn Ledger, PA-C   10 mg at 03/07/21 2035   pantoprazole (PROTONIX) EC tablet 40 mg  40 mg Oral Daily Saverio Danker, PA-C   40 mg at 03/08/21 0940   polyethylene glycol (MIRALAX / GLYCOLAX) packet 17 g  17 g Oral Daily PRN Jesusita Oka, MD       thiamine tablet 100 mg  100 mg Oral Daily Donnie Mesa, MD   100 mg at 03/08/21 0940   Or    thiamine (B-1) injection 100 mg  100 mg Intravenous Daily Donnie Mesa, MD   100 mg at 02/20/21 1005   traMADol (ULTRAM) tablet 50 mg  50 mg Oral Q6H Jesusita Oka, MD   50 mg at 03/08/21 0900     Discharge Medications: Please see  discharge summary for a list of discharge medications.  Relevant Imaging Results:  Relevant Lab Results:   Additional Information SS # [id number]  Reinaldo Raddle, RN, BSN  Trauma/Neuro ICU Case Manager [phone]
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
## 2021-03-08 NOTE — Progress Notes (Signed)
Report called to [facility] spoke with nurse [facility] who will taking care of patient in room 1202P. Nurse verbalized understanding of all teaching. Patient currently waiting in room for transport.

## 2021-03-09 DIAGNOSIS — I739 Peripheral vascular disease, unspecified: Secondary | ICD-10-CM | POA: Diagnosis not present

## 2021-03-09 DIAGNOSIS — J9601 Acute respiratory failure with hypoxia: Secondary | ICD-10-CM | POA: Diagnosis not present

## 2021-03-09 DIAGNOSIS — F39 Unspecified mood [affective] disorder: Secondary | ICD-10-CM | POA: Diagnosis not present

## 2021-03-09 DIAGNOSIS — F1020 Alcohol dependence, uncomplicated: Secondary | ICD-10-CM | POA: Diagnosis not present

## 2021-03-09 DIAGNOSIS — S32009A Unspecified fracture of unspecified lumbar vertebra, initial encounter for closed fracture: Secondary | ICD-10-CM | POA: Diagnosis not present

## 2021-03-09 DIAGNOSIS — F1320 Sedative, hypnotic or anxiolytic dependence, uncomplicated: Secondary | ICD-10-CM | POA: Diagnosis not present

## 2021-03-09 DIAGNOSIS — S22089A Unspecified fracture of T11-T12 vertebra, initial encounter for closed fracture: Secondary | ICD-10-CM | POA: Diagnosis not present

## 2021-03-09 DIAGNOSIS — S2249XA Multiple fractures of ribs, unspecified side, initial encounter for closed fracture: Secondary | ICD-10-CM | POA: Diagnosis not present

## 2021-03-09 DIAGNOSIS — F17201 Nicotine dependence, unspecified, in remission: Secondary | ICD-10-CM | POA: Diagnosis not present

## 2021-03-14 DIAGNOSIS — S32009D Unspecified fracture of unspecified lumbar vertebra, subsequent encounter for fracture with routine healing: Secondary | ICD-10-CM | POA: Diagnosis not present

## 2021-03-14 DIAGNOSIS — J9601 Acute respiratory failure with hypoxia: Secondary | ICD-10-CM | POA: Diagnosis not present

## 2021-03-14 DIAGNOSIS — K5900 Constipation, unspecified: Secondary | ICD-10-CM | POA: Diagnosis not present

## 2021-03-14 DIAGNOSIS — S2249XA Multiple fractures of ribs, unspecified side, initial encounter for closed fracture: Secondary | ICD-10-CM | POA: Diagnosis not present

## 2021-03-14 DIAGNOSIS — F1020 Alcohol dependence, uncomplicated: Secondary | ICD-10-CM | POA: Diagnosis not present

## 2021-03-14 DIAGNOSIS — F39 Unspecified mood [affective] disorder: Secondary | ICD-10-CM | POA: Diagnosis not present

## 2021-03-14 DIAGNOSIS — S22089D Unspecified fracture of T11-T12 vertebra, subsequent encounter for fracture with routine healing: Secondary | ICD-10-CM | POA: Diagnosis not present

## 2021-03-16 DIAGNOSIS — S32009D Unspecified fracture of unspecified lumbar vertebra, subsequent encounter for fracture with routine healing: Secondary | ICD-10-CM | POA: Diagnosis not present

## 2021-03-16 DIAGNOSIS — S2249XA Multiple fractures of ribs, unspecified side, initial encounter for closed fracture: Secondary | ICD-10-CM | POA: Diagnosis not present

## 2021-03-16 DIAGNOSIS — I739 Peripheral vascular disease, unspecified: Secondary | ICD-10-CM | POA: Diagnosis not present

## 2021-03-16 DIAGNOSIS — F39 Unspecified mood [affective] disorder: Secondary | ICD-10-CM | POA: Diagnosis not present

## 2021-03-16 DIAGNOSIS — F1320 Sedative, hypnotic or anxiolytic dependence, uncomplicated: Secondary | ICD-10-CM | POA: Diagnosis not present

## 2021-03-16 DIAGNOSIS — F1020 Alcohol dependence, uncomplicated: Secondary | ICD-10-CM | POA: Diagnosis not present

## 2021-03-16 DIAGNOSIS — S22089D Unspecified fracture of T11-T12 vertebra, subsequent encounter for fracture with routine healing: Secondary | ICD-10-CM | POA: Diagnosis not present

## 2021-03-26 ENCOUNTER — Telehealth: Payer: Self-pay

## 2021-03-26 NOTE — Telephone Encounter (Signed)
Verdis Frederickson from [facility] called asking for verbal orders for home physical therapy  frequency 1 x1, 2x 2, 1 x5 Call back # [phone] ok to leave message

## 2021-03-26 NOTE — Telephone Encounter (Signed)
Please advise 

## 2021-03-27 NOTE — Telephone Encounter (Signed)
Please okay these orders  ?

## 2021-03-27 NOTE — Telephone Encounter (Signed)
Spoke with Verdis Frederickson, okay for verbal orders given.

## 2021-03-29 ENCOUNTER — Telehealth: Payer: Self-pay | Admitting: Family Medicine

## 2021-03-29 DIAGNOSIS — S22009D Unspecified fracture of unspecified thoracic vertebra, subsequent encounter for fracture with routine healing: Secondary | ICD-10-CM | POA: Diagnosis not present

## 2021-03-29 DIAGNOSIS — Z6829 Body mass index (BMI) 29.0-29.9, adult: Secondary | ICD-10-CM | POA: Diagnosis not present

## 2021-03-29 NOTE — Telephone Encounter (Signed)
Left message for patient to call back and schedule Medicare Annual Wellness Visit (AWV) either virtually or in office. Left  my jabber number [phone]  awvi 02/20/21 per [place] please schedule at anytime with [facility] Nurse Health Advisor 1 or 2   This should be a 45 minute visit.  

## 2021-04-04 DIAGNOSIS — Z9181 History of falling: Secondary | ICD-10-CM

## 2021-04-04 DIAGNOSIS — S32019D Unspecified fracture of first lumbar vertebra, subsequent encounter for fracture with routine healing: Secondary | ICD-10-CM | POA: Diagnosis not present

## 2021-04-04 DIAGNOSIS — F10239 Alcohol dependence with withdrawal, unspecified: Secondary | ICD-10-CM

## 2021-04-04 DIAGNOSIS — S22089D Unspecified fracture of T11-T12 vertebra, subsequent encounter for fracture with routine healing: Secondary | ICD-10-CM | POA: Diagnosis not present

## 2021-04-04 DIAGNOSIS — E785 Hyperlipidemia, unspecified: Secondary | ICD-10-CM

## 2021-04-04 DIAGNOSIS — Z8673 Personal history of transient ischemic attack (TIA), and cerebral infarction without residual deficits: Secondary | ICD-10-CM

## 2021-04-04 DIAGNOSIS — F32A Depression, unspecified: Secondary | ICD-10-CM

## 2021-04-04 DIAGNOSIS — F17210 Nicotine dependence, cigarettes, uncomplicated: Secondary | ICD-10-CM

## 2021-04-04 DIAGNOSIS — E1151 Type 2 diabetes mellitus with diabetic peripheral angiopathy without gangrene: Secondary | ICD-10-CM

## 2021-04-04 DIAGNOSIS — J9601 Acute respiratory failure with hypoxia: Secondary | ICD-10-CM

## 2021-04-04 DIAGNOSIS — F1320 Sedative, hypnotic or anxiolytic dependence, uncomplicated: Secondary | ICD-10-CM

## 2021-04-04 DIAGNOSIS — N529 Male erectile dysfunction, unspecified: Secondary | ICD-10-CM

## 2021-04-04 DIAGNOSIS — F419 Anxiety disorder, unspecified: Secondary | ICD-10-CM

## 2021-04-04 DIAGNOSIS — K5900 Constipation, unspecified: Secondary | ICD-10-CM

## 2021-04-04 DIAGNOSIS — Z7984 Long term (current) use of oral hypoglycemic drugs: Secondary | ICD-10-CM

## 2021-04-04 DIAGNOSIS — I10 Essential (primary) hypertension: Secondary | ICD-10-CM

## 2021-04-04 DIAGNOSIS — Z7982 Long term (current) use of aspirin: Secondary | ICD-10-CM

## 2021-04-04 DIAGNOSIS — S2243XD Multiple fractures of ribs, bilateral, subsequent encounter for fracture with routine healing: Secondary | ICD-10-CM | POA: Diagnosis not present

## 2021-04-04 DIAGNOSIS — S270XXD Traumatic pneumothorax, subsequent encounter: Secondary | ICD-10-CM | POA: Diagnosis not present

## 2021-04-10 ENCOUNTER — Ambulatory Visit (INDEPENDENT_AMBULATORY_CARE_PROVIDER_SITE_OTHER): Payer: Medicare HMO

## 2021-04-10 VITALS — Ht 68.0 in | Wt 213.0 lb

## 2021-04-10 DIAGNOSIS — Z0000 Encounter for general adult medical examination without abnormal findings: Secondary | ICD-10-CM | POA: Diagnosis not present

## 2021-04-10 NOTE — Patient Instructions (Addendum)
[name] , Thank you for taking time to come for your Medicare Wellness Visit. I appreciate your ongoing commitment to your health goals. Please review the following plan we discussed and let me know if I can assist you in the future.   These are the goals we discussed:  Goals   None     This is a list of the screening recommended for you and due dates:  Health Maintenance  Topic Date Due   Complete foot exam   Never done   Eye exam for diabetics  04/10/2021*   COVID-19 Vaccine (5 - Booster for Pfizer series) 04/26/2021*   Zoster (Shingles) Vaccine (1 of 2) 05/11/2021*   Flu Shot  07/20/2021*   Pneumonia Vaccine (1 - PCV) 04/10/2022*   Hemoglobin A1C  09/04/2021   Colon Cancer Screening  08/29/2023   Tetanus Vaccine  09/10/2027   Hepatitis C Screening: USPSTF Recommendation to screen - Ages 18-79 yo.  Completed   HPV Vaccine  Aged Out  *Topic was postponed. The date shown is not the original due date.   Opioid Pain Medicine Management Opioids are powerful medicines that are used to treat moderate to severe pain. When used for short periods of time, they can help you to: Sleep better. Do better in physical or occupational therapy. Feel better in the first few days after an injury. Recover from surgery. Opioids should be taken with the supervision of a trained health care provider. They should be taken for the shortest period of time possible. This is because opioids can be addictive, and the longer you take opioids, the greater your risk of addiction. This addiction can also be called opioid use disorder. What are the risks? Using opioid pain medicines for longer than 3 days increases your risk of side effects. Side effects include: Constipation. Nausea and vomiting. Breathing difficulties (respiratory depression). Drowsiness. Confusion. Opioid use disorder. Itching. Taking opioid pain medicine for a long period of time can affect your ability to do daily tasks. It also  puts you at risk for: Motor vehicle crashes. Depression. Suicide. Heart attack. Overdose, which can be life-threatening. What is a pain treatment plan? A pain treatment plan is an agreement between you and your health care provider. Pain is unique to each person, and treatments vary depending on your condition. To manage your pain, you and your health care provider need to work together. To help you do this: Discuss the goals of your treatment, including how much pain you might expect to have and how you will manage the pain. Review the risks and benefits of taking opioid medicines. Remember that a good treatment plan uses more than one approach and minimizes the chance of side effects. Be honest about the amount of medicines you take and about any drug or alcohol use. Get pain medicine prescriptions from only one health care provider. Pain can be managed with many types of alternative treatments. Ask your health care provider to refer you to one or more specialists who can help you manage pain through: Physical or occupational therapy. Counseling (cognitive behavioral therapy). Good nutrition. Biofeedback. Massage. Meditation. Non-opioid medicine. Following a gentle exercise program. How to use opioid pain medicine Taking medicine Take your pain medicine exactly as told by your health care provider. Take it only when you need it. If your pain gets less severe, you may take less than your prescribed dose if your health care provider approves. If you are not having pain, do nottake pain medicine unless your  health care provider tells you to take it. If your pain is severe, do nottry to treat it yourself by taking more pills than instructed on your prescription. Contact your health care provider for help. Write down the times when you take your pain medicine. It is easy to become confused while on pain medicine. Writing the time can help you avoid overdose. Take other over-the-counter or  prescription medicines only as told by your health care provider. Keeping yourself and others safe  While you are taking opioid pain medicine: Do not drive, use machinery, or power tools. Do not sign legal documents. Do not drink alcohol. Do not take sleeping pills. Do not supervise children by yourself. Do not do activities that require climbing or being in high places. Do not go to a lake, river, ocean, spa, or swimming pool. Do not share your pain medicine with anyone. Keep pain medicine in a locked cabinet or in a secure area where pets and children cannot reach it. Stopping your use of opioids If you have been taking opioid medicine for more than a few weeks, you may need to slowly decrease (taper) how much you take until you stop completely. Tapering your use of opioids can decrease your risk of symptoms of withdrawal, such as: Pain and cramping in the abdomen. Nausea. Sweating. Sleepiness. Restlessness. Uncontrollable shaking (tremors). Cravings for the medicine. Do not attempt to taper your use of opioids on your own. Talk with your health care provider about how to do this. Your health care provider may prescribe a step-down schedule based on how much medicine you are taking and how long you have been taking it. Getting rid of leftover pills Do not save any leftover pills. Get rid of leftover pills safely by: Taking the medicine to a prescription take-back program. This is usually offered by the county or law enforcement. Bringing them to a pharmacy that has a drug disposal container. Flushing them down the toilet. Check the label or package insert of your medicine to see whether this is safe to do. Throwing them out in the trash. Check the label or package insert of your medicine to see whether this is safe to do. If it is safe to throw it out, remove the medicine from the original container, put it into a sealable bag or container, and mix it with used coffee grounds, food  scraps, dirt, or cat litter before putting it in the trash. Follow these instructions at home: Activity Do exercises as told by your health care provider. Avoid activities that make your pain worse. Return to your normal activities as told by your health care provider. Ask your health care provider what activities are safe for you. General instructions You may need to take these actions to prevent or treat constipation: Drink enough fluid to keep your urine pale yellow. Take over-the-counter or prescription medicines. Eat foods that are high in fiber, such as beans, whole grains, and fresh fruits and vegetables. Limit foods that are high in fat and processed sugars, such as fried or sweet foods. Keep all follow-up visits. This is important. Where to find support If you have been taking opioids for a long time, you may benefit from receiving support for quitting from a local support group or counselor. Ask your health care provider for a referral to these resources in your area. Where to find more information Centers for Disease Control and Prevention (CDC): http://www.wolf.info/ [place] Food and Drug Administration (FDA): [employer] Get help right away if: You  may have taken too much of an opioid (overdosed). Common symptoms of an overdose: Your breathing is slower or more shallow than normal. You have a very slow heartbeat (pulse). You have slurred speech. You have nausea and vomiting. Your pupils become very small. You have other potential symptoms: You are very confused. You faint or feel like you will faint. You have cold, clammy skin. You have blue lips or fingernails. You have thoughts of harming yourself or harming others. These symptoms may represent a serious problem that is an emergency. Do not wait to see if the symptoms will go away. Get medical help right away. Call your local emergency services (911 in the [place] Do not drive yourself to the hospital.  If you ever feel like you may  hurt yourself or others, or have thoughts about taking your own life, get help right away. Go to your nearest emergency department or: Call your local emergency services (911 in the [place] Call the [facility] 952-511-7609 in the [place] Call a suicide crisis helpline, such as the [place] at (423)039-6792 or 988 in the [place]. This is open 24 hours a day in the [place] Text the Crisis Text Line at 217 283 3594 (in the [place].). Summary Opioid medicines can help you manage moderate to severe pain for a short period of time. A pain treatment plan is an agreement between you and your health care provider. Discuss the goals of your treatment, including how much pain you might expect to have and how you will manage the pain. If you think that you or someone else may have taken too much of an opioid, get medical help right away. This information is not intended to replace advice given to you by your health care provider. Make sure you discuss any questions you have with your health care provider. Document Revised: 11/01/2020 Document Reviewed: 07/19/2020 Elsevier Patient Education  [place] directives: Yes  Conditions/risks identified: None  Next appointment: Follow up in one year for your annual wellness visit.   Preventive Care 66 Years and Older, Male Preventive care refers to lifestyle choices and visits with your health care provider that can promote health and wellness. What does preventive care include? A yearly physical exam. This is also called an annual well check. Dental exams once or twice a year. Routine eye exams. Ask your health care provider how often you should have your eyes checked. Personal lifestyle choices, including: Daily care of your teeth and gums. Regular physical activity. Eating a healthy diet. Avoiding tobacco and drug use. Limiting alcohol use. Practicing safe sex. Taking low doses of aspirin every  day. Taking vitamin and mineral supplements as recommended by your health care provider. What happens during an annual well check? The services and screenings done by your health care provider during your annual well check will depend on your age, overall health, lifestyle risk factors, and family history of disease. Counseling  Your health care provider may ask you questions about your: Alcohol use. Tobacco use. Drug use. Emotional well-being. Home and relationship well-being. Sexual activity. Eating habits. History of falls. Memory and ability to understand (cognition). Work and work Statistician. Screening  You may have the following tests or measurements: Height, weight, and BMI. Blood pressure. Lipid and cholesterol levels. These may be checked every 5 years, or more frequently if you are over 71 years old. Skin check. Lung cancer screening. You may have this screening every year starting at age 43 if  you have a 30-pack-year history of smoking and currently smoke or have quit within the past 15 years. Fecal occult blood test (FOBT) of the stool. You may have this test every year starting at age 73. Flexible sigmoidoscopy or colonoscopy. You may have a sigmoidoscopy every 5 years or a colonoscopy every 10 years starting at age 32. Prostate cancer screening. Recommendations will vary depending on your family history and other risks. Hepatitis C blood test. Hepatitis B blood test. Sexually transmitted disease (STD) testing. Diabetes screening. This is done by checking your blood sugar (glucose) after you have not eaten for a while (fasting). You may have this done every 1-3 years. Abdominal aortic aneurysm (AAA) screening. You may need this if you are a current or former smoker. Osteoporosis. You may be screened starting at age 43 if you are at high risk. Talk with your health care provider about your test results, treatment options, and if necessary, the need for more  tests. Vaccines  Your health care provider may recommend certain vaccines, such as: Influenza vaccine. This is recommended every year. Tetanus, diphtheria, and acellular pertussis (Tdap, Td) vaccine. You may need a Td booster every 10 years. Zoster vaccine. You may need this after age 86. Pneumococcal 13-valent conjugate (PCV13) vaccine. One dose is recommended after age 59. Pneumococcal polysaccharide (PPSV23) vaccine. One dose is recommended after age 7. Talk to your health care provider about which screenings and vaccines you need and how often you need them. This information is not intended to replace advice given to you by your health care provider. Make sure you discuss any questions you have with your health care provider. Document Released: 05/05/2015 Document Revised: 12/27/2015 Document Reviewed: 02/07/2015 Elsevier Interactive Patient Education  2017 [place] Prevention in the Home Falls can cause injuries. They can happen to people of all ages. There are many things you can do to make your home safe and to help prevent falls. What can I do on the outside of my home? Regularly fix the edges of walkways and driveways and fix any cracks. Remove anything that might make you trip as you walk through a door, such as a raised step or threshold. Trim any bushes or trees on the path to your home. Use bright outdoor lighting. Clear any walking paths of anything that might make someone trip, such as rocks or tools. Regularly check to see if handrails are loose or broken. Make sure that both sides of any steps have handrails. Any raised decks and porches should have guardrails on the edges. Have any leaves, snow, or ice cleared regularly. Use sand or salt on walking paths during winter. Clean up any spills in your garage right away. This includes oil or grease spills. What can I do in the bathroom? Use night lights. Install grab bars by the toilet and in the tub and shower.  Do not use towel bars as grab bars. Use non-skid mats or decals in the tub or shower. If you need to sit down in the shower, use a plastic, non-slip stool. Keep the floor dry. Clean up any water that spills on the floor as soon as it happens. Remove soap buildup in the tub or shower regularly. Attach bath mats securely with double-sided non-slip rug tape. Do not have throw rugs and other things on the floor that can make you trip. What can I do in the bedroom? Use night lights. Make sure that you have a light by your bed that is  easy to reach. Do not use any sheets or blankets that are too big for your bed. They should not hang down onto the floor. Have a firm chair that has side arms. You can use this for support while you get dressed. Do not have throw rugs and other things on the floor that can make you trip. What can I do in the kitchen? Clean up any spills right away. Avoid walking on wet floors. Keep items that you use a lot in easy-to-reach places. If you need to reach something above you, use a strong step stool that has a grab bar. Keep electrical cords out of the way. Do not use floor polish or wax that makes floors slippery. If you must use wax, use non-skid floor wax. Do not have throw rugs and other things on the floor that can make you trip. What can I do with my stairs? Do not leave any items on the stairs. Make sure that there are handrails on both sides of the stairs and use them. Fix handrails that are broken or loose. Make sure that handrails are as long as the stairways. Check any carpeting to make sure that it is firmly attached to the stairs. Fix any carpet that is loose or worn. Avoid having throw rugs at the top or bottom of the stairs. If you do have throw rugs, attach them to the floor with carpet tape. Make sure that you have a light switch at the top of the stairs and the bottom of the stairs. If you do not have them, ask someone to add them for you. What else  can I do to help prevent falls? Wear shoes that: Do not have high heels. Have rubber bottoms. Are comfortable and fit you well. Are closed at the toe. Do not wear sandals. If you use a stepladder: Make sure that it is fully opened. Do not climb a closed stepladder. Make sure that both sides of the stepladder are locked into place. Ask someone to hold it for you, if possible. Clearly mark and make sure that you can see: Any grab bars or handrails. First and last steps. Where the edge of each step is. Use tools that help you move around (mobility aids) if they are needed. These include: Canes. Walkers. Scooters. Crutches. Turn on the lights when you go into a dark area. Replace any light bulbs as soon as they burn out. Set up your furniture so you have a clear path. Avoid moving your furniture around. If any of your floors are uneven, fix them. If there are any pets around you, be aware of where they are. Review your medicines with your doctor. Some medicines can make you feel dizzy. This can increase your chance of falling. Ask your doctor what other things that you can do to help prevent falls. This information is not intended to replace advice given to you by your health care provider. Make sure you discuss any questions you have with your health care provider. Document Released: 02/02/2009 Document Revised: 09/14/2015 Document Reviewed: 05/13/2014 Elsevier Interactive Patient Education  2017 [employer].
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
## 2021-04-11 ENCOUNTER — Telehealth (INDEPENDENT_AMBULATORY_CARE_PROVIDER_SITE_OTHER): Payer: Medicare HMO | Admitting: Family Medicine

## 2021-04-11 DIAGNOSIS — Z8781 Personal history of (healed) traumatic fracture: Secondary | ICD-10-CM | POA: Diagnosis not present

## 2021-04-11 DIAGNOSIS — S2243XA Multiple fractures of ribs, bilateral, initial encounter for closed fracture: Secondary | ICD-10-CM

## 2021-04-11 DIAGNOSIS — F411 Generalized anxiety disorder: Secondary | ICD-10-CM | POA: Diagnosis not present

## 2021-04-11 MED ORDER — HYDROXYZINE HCL 10 MG PO TABS: 10.0000 mg | ORAL_TABLET | Freq: Three times a day (TID) | ORAL | 5 refills | Status: DC | PRN

## 2021-04-12 ENCOUNTER — Encounter: Payer: Self-pay | Admitting: Family Medicine

## 2021-04-12 DIAGNOSIS — Z8781 Personal history of (healed) traumatic fracture: Secondary | ICD-10-CM | POA: Insufficient documentation
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
## 2021-04-25 NOTE — Telephone Encounter (Signed)
Yes I can see his son

## 2021-05-05 ENCOUNTER — Other Ambulatory Visit: Payer: Self-pay | Admitting: Family Medicine

## 2021-05-05 DIAGNOSIS — E1165 Type 2 diabetes mellitus with hyperglycemia: Secondary | ICD-10-CM

## 2021-05-09 DIAGNOSIS — H6123 Impacted cerumen, bilateral: Secondary | ICD-10-CM | POA: Diagnosis not present

## 2021-06-03 ENCOUNTER — Encounter: Payer: Self-pay | Admitting: Family Medicine

## 2021-06-03 ENCOUNTER — Other Ambulatory Visit: Payer: Self-pay | Admitting: Family Medicine

## 2021-06-04 MED ORDER — FLUOXETINE HCL 10 MG PO CAPS: 30.0000 mg | ORAL_CAPSULE | Freq: Every day | ORAL | 3 refills | Status: DC

## 2021-06-04 NOTE — Telephone Encounter (Signed)
Done

## 2021-06-11 ENCOUNTER — Other Ambulatory Visit: Payer: Self-pay | Admitting: Family Medicine

## 2021-07-23 ENCOUNTER — Other Ambulatory Visit: Payer: Self-pay | Admitting: Family Medicine

## 2021-07-23 DIAGNOSIS — E1165 Type 2 diabetes mellitus with hyperglycemia: Secondary | ICD-10-CM

## 2021-08-02 ENCOUNTER — Other Ambulatory Visit: Payer: Self-pay | Admitting: Family Medicine

## 2021-08-24 DIAGNOSIS — Z6830 Body mass index (BMI) 30.0-30.9, adult: Secondary | ICD-10-CM | POA: Diagnosis not present

## 2021-08-24 DIAGNOSIS — S22009D Unspecified fracture of unspecified thoracic vertebra, subsequent encounter for fracture with routine healing: Secondary | ICD-10-CM | POA: Diagnosis not present

## 2021-09-04 ENCOUNTER — Other Ambulatory Visit: Payer: Self-pay | Admitting: Family Medicine

## 2021-09-19 ENCOUNTER — Ambulatory Visit: Payer: Medicare HMO

## 2021-09-21 ENCOUNTER — Other Ambulatory Visit: Payer: Self-pay | Admitting: Family Medicine

## 2021-10-16 ENCOUNTER — Other Ambulatory Visit: Payer: Self-pay | Admitting: Family Medicine

## 2021-10-21 ENCOUNTER — Other Ambulatory Visit: Payer: Self-pay | Admitting: Family Medicine

## 2021-10-21 DIAGNOSIS — E1165 Type 2 diabetes mellitus with hyperglycemia: Secondary | ICD-10-CM

## 2021-11-08 DIAGNOSIS — H33101 Unspecified retinoschisis, right eye: Secondary | ICD-10-CM | POA: Diagnosis not present

## 2021-11-08 DIAGNOSIS — H2513 Age-related nuclear cataract, bilateral: Secondary | ICD-10-CM | POA: Diagnosis not present

## 2021-11-08 DIAGNOSIS — H43393 Other vitreous opacities, bilateral: Secondary | ICD-10-CM | POA: Diagnosis not present

## 2021-11-08 LAB — HM DIABETES EYE EXAM

## 2021-11-14 DIAGNOSIS — H43393 Other vitreous opacities, bilateral: Secondary | ICD-10-CM | POA: Diagnosis not present

## 2021-11-14 DIAGNOSIS — H33011 Retinal detachment with single break, right eye: Secondary | ICD-10-CM | POA: Diagnosis not present

## 2021-11-14 DIAGNOSIS — H4311 Vitreous hemorrhage, right eye: Secondary | ICD-10-CM | POA: Diagnosis not present

## 2021-11-14 DIAGNOSIS — E1136 Type 2 diabetes mellitus with diabetic cataract: Secondary | ICD-10-CM | POA: Diagnosis not present

## 2021-11-14 DIAGNOSIS — H43813 Vitreous degeneration, bilateral: Secondary | ICD-10-CM | POA: Diagnosis not present

## 2021-11-15 DIAGNOSIS — H33011 Retinal detachment with single break, right eye: Secondary | ICD-10-CM | POA: Diagnosis not present

## 2021-11-15 DIAGNOSIS — H33021 Retinal detachment with multiple breaks, right eye: Secondary | ICD-10-CM | POA: Diagnosis not present

## 2021-11-16 DIAGNOSIS — H59811 Chorioretinal scars after surgery for detachment, right eye: Secondary | ICD-10-CM | POA: Diagnosis not present

## 2021-11-21 ENCOUNTER — Other Ambulatory Visit: Payer: Self-pay | Admitting: Family Medicine

## 2021-11-25 ENCOUNTER — Other Ambulatory Visit: Payer: Self-pay | Admitting: Family Medicine

## 2021-11-26 NOTE — Telephone Encounter (Signed)
Pt needs a physical/ labs appointment for further refills

## 2021-11-27 ENCOUNTER — Encounter: Payer: Self-pay | Admitting: Family Medicine

## 2021-12-12 DIAGNOSIS — H59811 Chorioretinal scars after surgery for detachment, right eye: Secondary | ICD-10-CM | POA: Diagnosis not present

## 2021-12-12 DIAGNOSIS — H33021 Retinal detachment with multiple breaks, right eye: Secondary | ICD-10-CM | POA: Diagnosis not present

## 2021-12-14 ENCOUNTER — Other Ambulatory Visit: Payer: Self-pay | Admitting: Family Medicine

## 2021-12-26 ENCOUNTER — Other Ambulatory Visit: Payer: Self-pay | Admitting: Family Medicine

## 2022-01-09 DIAGNOSIS — H59811 Chorioretinal scars after surgery for detachment, right eye: Secondary | ICD-10-CM | POA: Diagnosis not present

## 2022-01-09 DIAGNOSIS — H33021 Retinal detachment with multiple breaks, right eye: Secondary | ICD-10-CM | POA: Diagnosis not present

## 2022-01-16 ENCOUNTER — Encounter: Payer: Self-pay | Admitting: Family Medicine

## 2022-01-16 ENCOUNTER — Ambulatory Visit (INDEPENDENT_AMBULATORY_CARE_PROVIDER_SITE_OTHER): Payer: Medicare HMO | Admitting: Family Medicine

## 2022-01-16 ENCOUNTER — Ambulatory Visit: Payer: Medicare HMO

## 2022-01-16 ENCOUNTER — Ambulatory Visit
Admission: RE | Admit: 2022-01-16 | Discharge: 2022-01-16 | Disposition: A | Discharge status: Home / Self Care | Payer: Medicare HMO | Source: Ambulatory Visit | Attending: Family Medicine | Admitting: Family Medicine

## 2022-01-16 VITALS — BP 124/80 | HR 75 | Temp 98.1°F | Wt 208.0 lb

## 2022-01-16 DIAGNOSIS — R1031 Right lower quadrant pain: Secondary | ICD-10-CM | POA: Diagnosis not present

## 2022-01-16 DIAGNOSIS — E119 Type 2 diabetes mellitus without complications: Secondary | ICD-10-CM | POA: Diagnosis not present

## 2022-01-16 LAB — POC URINALSYSI DIPSTICK (AUTOMATED)
Bilirubin, UA: NEGATIVE
Blood, UA: NEGATIVE
Glucose, UA: NEGATIVE
Ketones, UA: NEGATIVE
Leukocytes, UA: NEGATIVE
Nitrite, UA: NEGATIVE
Protein, UA: POSITIVE — AB
Spec Grav, UA: 1.025 (ref 1.010–1.025)
Urobilinogen, UA: 1 E.U./dL
pH, UA: 6 (ref 5.0–8.0)

## 2022-01-16 LAB — CBC WITH DIFFERENTIAL/PLATELET
Basophils Absolute: 0.1 10*3/uL (ref 0.0–0.1)
Basophils Relative: 0.8 % (ref 0.0–3.0)
Eosinophils Absolute: 0.2 10*3/uL (ref 0.0–0.7)
Eosinophils Relative: 2.7 % (ref 0.0–5.0)
HCT: 41.4 % (ref 39.0–52.0)
Hemoglobin: 14.5 g/dL (ref 13.0–17.0)
Lymphocytes Relative: 27.9 % (ref 12.0–46.0)
Lymphs Abs: 1.8 10*3/uL (ref 0.7–4.0)
MCHC: 35 g/dL (ref 30.0–36.0)
MCV: 90.6 fl (ref 78.0–100.0)
Monocytes Absolute: 0.7 10*3/uL (ref 0.1–1.0)
Monocytes Relative: 11.2 % (ref 3.0–12.0)
Neutro Abs: 3.7 10*3/uL (ref 1.4–7.7)
Neutrophils Relative %: 57.4 % (ref 43.0–77.0)
Platelets: 305 10*3/uL (ref 150.0–400.0)
RBC: 4.57 Mil/uL (ref 4.22–5.81)
RDW: 12.9 % (ref 11.5–15.5)
WBC: 6.4 10*3/uL (ref 4.0–10.5)

## 2022-01-16 LAB — BASIC METABOLIC PANEL
BUN: 14 mg/dL (ref 6–23)
CO2: 27 mEq/L (ref 19–32)
Calcium: 9.6 mg/dL (ref 8.4–10.5)
Chloride: 99 mEq/L (ref 96–112)
Creatinine, Ser: 0.86 mg/dL (ref 0.40–1.50)
GFR: 90.03 mL/min (ref >60.00)
Glucose, Bld: 171 mg/dL — ABNORMAL HIGH (ref 70–99)
Potassium: 4.1 mEq/L (ref 3.5–5.1)
Sodium: 134 mEq/L — ABNORMAL LOW (ref 135–145)

## 2022-01-16 MED ORDER — TAMSULOSIN HCL 0.4 MG PO CAPS: 0.4000 mg | ORAL_CAPSULE | Freq: Every day | ORAL | 3 refills | Status: DC

## 2022-01-16 NOTE — Progress Notes (Signed)
   Subjective:    Patient ID: Dean Alvarado, male    DOB: 1955/06/26, 66 y.o.   MRN: [id number]  HPI Here for intermittent sharp pains in the right lower back, right flank, and RLQ of the abdomen. These started about 8 months ago and they persisted for about a week, then they went away. He was fine until 5 days when the same type of pain came back. This time the worst pain was in the right flank and RLQ, and it sometimes radiates to the right groin area. No testicular pain. The pain is sharp in nature and it comes and goes. The worst pain was about 3 days ago, and he rates that as a 6 out of 10 pain. Today it is only mild. He denies any fever or nausea, no blood in the urine. No urinary urgency or burning. He does feel that his urine stream is very slow and it can be hard to pass the urine. BMs are normal.    Review of Systems  Constitutional: Negative.   Respiratory: Negative.    Cardiovascular: Negative.   Gastrointestinal: Negative.   Genitourinary:  Positive for difficulty urinating and flank pain. Negative for dysuria, frequency, hematuria, testicular pain and urgency.  Musculoskeletal:  Positive for back pain.       Objective:   Physical Exam Constitutional:      General: He is not in acute distress.    Appearance: Normal appearance.  Cardiovascular:     Rate and Rhythm: Normal rate and regular rhythm.     Pulses: Normal pulses.     Heart sounds: Normal heart sounds.  Pulmonary:     Effort: Pulmonary effort is normal.     Breath sounds: Normal breath sounds.  Abdominal:     General: Abdomen is flat. Bowel sounds are normal. There is no distension.     Palpations: Abdomen is soft. There is no mass.     Tenderness: There is right CVA tenderness. There is no left CVA tenderness, guarding or rebound.     Hernia: No hernia is present.     Comments: He is mildly tender in the RLQ   Neurological:     Mental Status: He is alert.           Assessment & Plan:  Right flank  pain and RLQ pain, I suspect he is trying to pass a kidney stone. He is to drink lots of water. He will start on Flomax 0.4 mg daily. We will check a CBC and BMET, and we will set up a CT renal stone scan. He will let us know if anything changes or if he gets a fever.  Alysia Penna, MD

## 2022-01-17 ENCOUNTER — Encounter: Payer: Self-pay | Admitting: Family Medicine

## 2022-01-18 LAB — URINE CULTURE
MICRO NUMBER:: 13975791
SPECIMEN QUALITY:: ADEQUATE

## 2022-01-18 NOTE — Telephone Encounter (Signed)
No need to worry about the protein. Just drink plenty of water every day

## 2022-01-18 NOTE — Telephone Encounter (Addendum)
Called spoke with patient about message concerning A1c levels.

## 2022-01-20 ENCOUNTER — Other Ambulatory Visit: Payer: Self-pay | Admitting: Family Medicine

## 2022-01-20 DIAGNOSIS — E1165 Type 2 diabetes mellitus with hyperglycemia: Secondary | ICD-10-CM

## 2022-02-23 ENCOUNTER — Other Ambulatory Visit: Payer: Self-pay | Admitting: Family Medicine

## 2022-03-19 ENCOUNTER — Other Ambulatory Visit: Payer: Self-pay | Admitting: Family Medicine

## 2022-04-11 ENCOUNTER — Ambulatory Visit (INDEPENDENT_AMBULATORY_CARE_PROVIDER_SITE_OTHER): Payer: Medicare HMO

## 2022-04-11 VITALS — Ht 68.0 in | Wt 208.0 lb

## 2022-04-11 DIAGNOSIS — Z0000 Encounter for general adult medical examination without abnormal findings: Secondary | ICD-10-CM | POA: Diagnosis not present

## 2022-04-11 NOTE — Patient Instructions (Addendum)
Dean Alvarado , Thank you for taking time to come for your Medicare Wellness Visit. I appreciate your ongoing commitment to your health goals. Please review the following plan we discussed and let me know if I can assist you in the future.   These are the goals we discussed:  Goals       Lose weight (pt-stated)        This is a list of the screening recommended for you and due dates:  Health Maintenance  Topic Date Due   Complete foot exam   Never done   Yearly kidney health urinalysis for diabetes  Never done   Hemoglobin A1C  09/04/2021   Screening for Lung Cancer  02/19/2022   COVID-19 Vaccine (5 - 2023-24 season) 04/27/2022*   Zoster (Shingles) Vaccine (1 of 2) 07/11/2022*   Flu Shot  07/21/2022*   Pneumonia Vaccine (1 - PCV) 04/12/2023*   Eye exam for diabetics  11/09/2022   Yearly kidney function blood test for diabetes  01/17/2023   Medicare Annual Wellness Visit  04/12/2023   Colon Cancer Screening  08/29/2023   DTaP/Tdap/Td vaccine (2 - Td or Tdap) 09/10/2027   Hepatitis C Screening: USPSTF Recommendation to screen - Ages 37-79 yo.  Completed   HPV Vaccine  Aged Out  *Topic was postponed. The date shown is not the original due date.    Advanced directives: Please bring a copy of your health care power of attorney and living will to the office to be added to your chart at your convenience.   Conditions/risks identified: None  Next appointment: Follow up in one year for your annual wellness visit.    Preventive Care 67 Years and Older, Male  Preventive care refers to lifestyle choices and visits with your health care provider that can promote health and wellness. What does preventive care include? A yearly physical exam. This is also called an annual well check. Dental exams once or twice a year. Routine eye exams. Ask your health care provider how often you should have your eyes checked. Personal lifestyle choices, including: Daily care of your teeth and  gums. Regular physical activity. Eating a healthy diet. Avoiding tobacco and drug use. Limiting alcohol use. Practicing safe sex. Taking low doses of aspirin every day. Taking vitamin and mineral supplements as recommended by your health care provider. What happens during an annual well check? The services and screenings done by your health care provider during your annual well check will depend on your age, overall health, lifestyle risk factors, and family history of disease. Counseling  Your health care provider may ask you questions about your: Alcohol use. Tobacco use. Drug use. Emotional well-being. Home and relationship well-being. Sexual activity. Eating habits. History of falls. Memory and ability to understand (cognition). Work and work Statistician. Screening  You may have the following tests or measurements: Height, weight, and BMI. Blood pressure. Lipid and cholesterol levels. These may be checked every 5 years, or more frequently if you are over 30 years old. Skin check. Lung cancer screening. You may have this screening every year starting at age 24 if you have a 30-pack-year history of smoking and currently smoke or have quit within the past 15 years. Fecal occult blood test (FOBT) of the stool. You may have this test every year starting at age 19. Flexible sigmoidoscopy or colonoscopy. You may have a sigmoidoscopy every 5 years or a colonoscopy every 10 years starting at age 60. Prostate cancer screening. Recommendations will vary depending  on your family history and other risks. Hepatitis C blood test. Hepatitis B blood test. Sexually transmitted disease (STD) testing. Diabetes screening. This is done by checking your blood sugar (glucose) after you have not eaten for a while (fasting). You may have this done every 1-3 years. Abdominal aortic aneurysm (AAA) screening. You may need this if you are a current or former smoker. Osteoporosis. You may be screened  starting at age 40 if you are at high risk. Talk with your health care provider about your test results, treatment options, and if necessary, the need for more tests. Vaccines  Your health care provider may recommend certain vaccines, such as: Influenza vaccine. This is recommended every year. Tetanus, diphtheria, and acellular pertussis (Tdap, Td) vaccine. You may need a Td booster every 10 years. Zoster vaccine. You may need this after age 18. Pneumococcal 13-valent conjugate (PCV13) vaccine. One dose is recommended after age 87. Pneumococcal polysaccharide (PPSV23) vaccine. One dose is recommended after age 30. Talk to your health care provider about which screenings and vaccines you need and how often you need them. This information is not intended to replace advice given to you by your health care provider. Make sure you discuss any questions you have with your health care provider. Document Released: 05/05/2015 Document Revised: 12/27/2015 Document Reviewed: 02/07/2015 Elsevier Interactive Patient Education  2017 [employer] Prevention in the Home Falls can cause injuries. They can happen to people of all ages. There are many things you can do to make your home safe and to help prevent falls. What can I do on the outside of my home? Regularly fix the edges of walkways and driveways and fix any cracks. Remove anything that might make you trip as you walk through a door, such as a raised step or threshold. Trim any bushes or trees on the path to your home. Use bright outdoor lighting. Clear any walking paths of anything that might make someone trip, such as rocks or tools. Regularly check to see if handrails are loose or broken. Make sure that both sides of any steps have handrails. Any raised decks and porches should have guardrails on the edges. Have any leaves, snow, or ice cleared regularly. Use sand or salt on walking paths during winter. Clean up any spills in your garage  right away. This includes oil or grease spills. What can I do in the bathroom? Use night lights. Install grab bars by the toilet and in the tub and shower. Do not use towel bars as grab bars. Use non-skid mats or decals in the tub or shower. If you need to sit down in the shower, use a plastic, non-slip stool. Keep the floor dry. Clean up any water that spills on the floor as soon as it happens. Remove soap buildup in the tub or shower regularly. Attach bath mats securely with double-sided non-slip rug tape. Do not have throw rugs and other things on the floor that can make you trip. What can I do in the bedroom? Use night lights. Make sure that you have a light by your bed that is easy to reach. Do not use any sheets or blankets that are too big for your bed. They should not hang down onto the floor. Have a firm chair that has side arms. You can use this for support while you get dressed. Do not have throw rugs and other things on the floor that can make you trip. What can I do in the kitchen?  Clean up any spills right away. Avoid walking on wet floors. Keep items that you use a lot in easy-to-reach places. If you need to reach something above you, use a strong step stool that has a grab bar. Keep electrical cords out of the way. Do not use floor polish or wax that makes floors slippery. If you must use wax, use non-skid floor wax. Do not have throw rugs and other things on the floor that can make you trip. What can I do with my stairs? Do not leave any items on the stairs. Make sure that there are handrails on both sides of the stairs and use them. Fix handrails that are broken or loose. Make sure that handrails are as long as the stairways. Check any carpeting to make sure that it is firmly attached to the stairs. Fix any carpet that is loose or worn. Avoid having throw rugs at the top or bottom of the stairs. If you do have throw rugs, attach them to the floor with carpet tape. Make  sure that you have a light switch at the top of the stairs and the bottom of the stairs. If you do not have them, ask someone to add them for you. What else can I do to help prevent falls? Wear shoes that: Do not have high heels. Have rubber bottoms. Are comfortable and fit you well. Are closed at the toe. Do not wear sandals. If you use a stepladder: Make sure that it is fully opened. Do not climb a closed stepladder. Make sure that both sides of the stepladder are locked into place. Ask someone to hold it for you, if possible. Clearly mark and make sure that you can see: Any grab bars or handrails. First and last steps. Where the edge of each step is. Use tools that help you move around (mobility aids) if they are needed. These include: Canes. Walkers. Scooters. Crutches. Turn on the lights when you go into a dark area. Replace any light bulbs as soon as they burn out. Set up your furniture so you have a clear path. Avoid moving your furniture around. If any of your floors are uneven, fix them. If there are any pets around you, be aware of where they are. Review your medicines with your doctor. Some medicines can make you feel dizzy. This can increase your chance of falling. Ask your doctor what other things that you can do to help prevent falls. This information is not intended to replace advice given to you by your health care provider. Make sure you discuss any questions you have with your health care provider. Document Released: 02/02/2009 Document Revised: 09/14/2015 Document Reviewed: 05/13/2014 Elsevier Interactive Patient Education  2017 [employer].
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
## 2022-04-13 ENCOUNTER — Other Ambulatory Visit: Payer: Self-pay | Admitting: Family Medicine

## 2022-05-07 DIAGNOSIS — H43392 Other vitreous opacities, left eye: Secondary | ICD-10-CM | POA: Diagnosis not present

## 2022-05-07 DIAGNOSIS — H31003 Unspecified chorioretinal scars, bilateral: Secondary | ICD-10-CM | POA: Diagnosis not present

## 2022-05-07 DIAGNOSIS — E1136 Type 2 diabetes mellitus with diabetic cataract: Secondary | ICD-10-CM | POA: Diagnosis not present

## 2022-05-07 DIAGNOSIS — H43812 Vitreous degeneration, left eye: Secondary | ICD-10-CM | POA: Diagnosis not present

## 2022-05-07 DIAGNOSIS — H259 Unspecified age-related cataract: Secondary | ICD-10-CM | POA: Diagnosis not present

## 2022-05-27 IMAGING — DX DG CHEST 1V PORT
1 series · 1 of 1 positions shown · non-contrast
Comparison: 02/22/2021 chest radiograph.

CLINICAL DATA: Dyspnea

EXAM:
PORTABLE CHEST 1 VIEW

[chest]
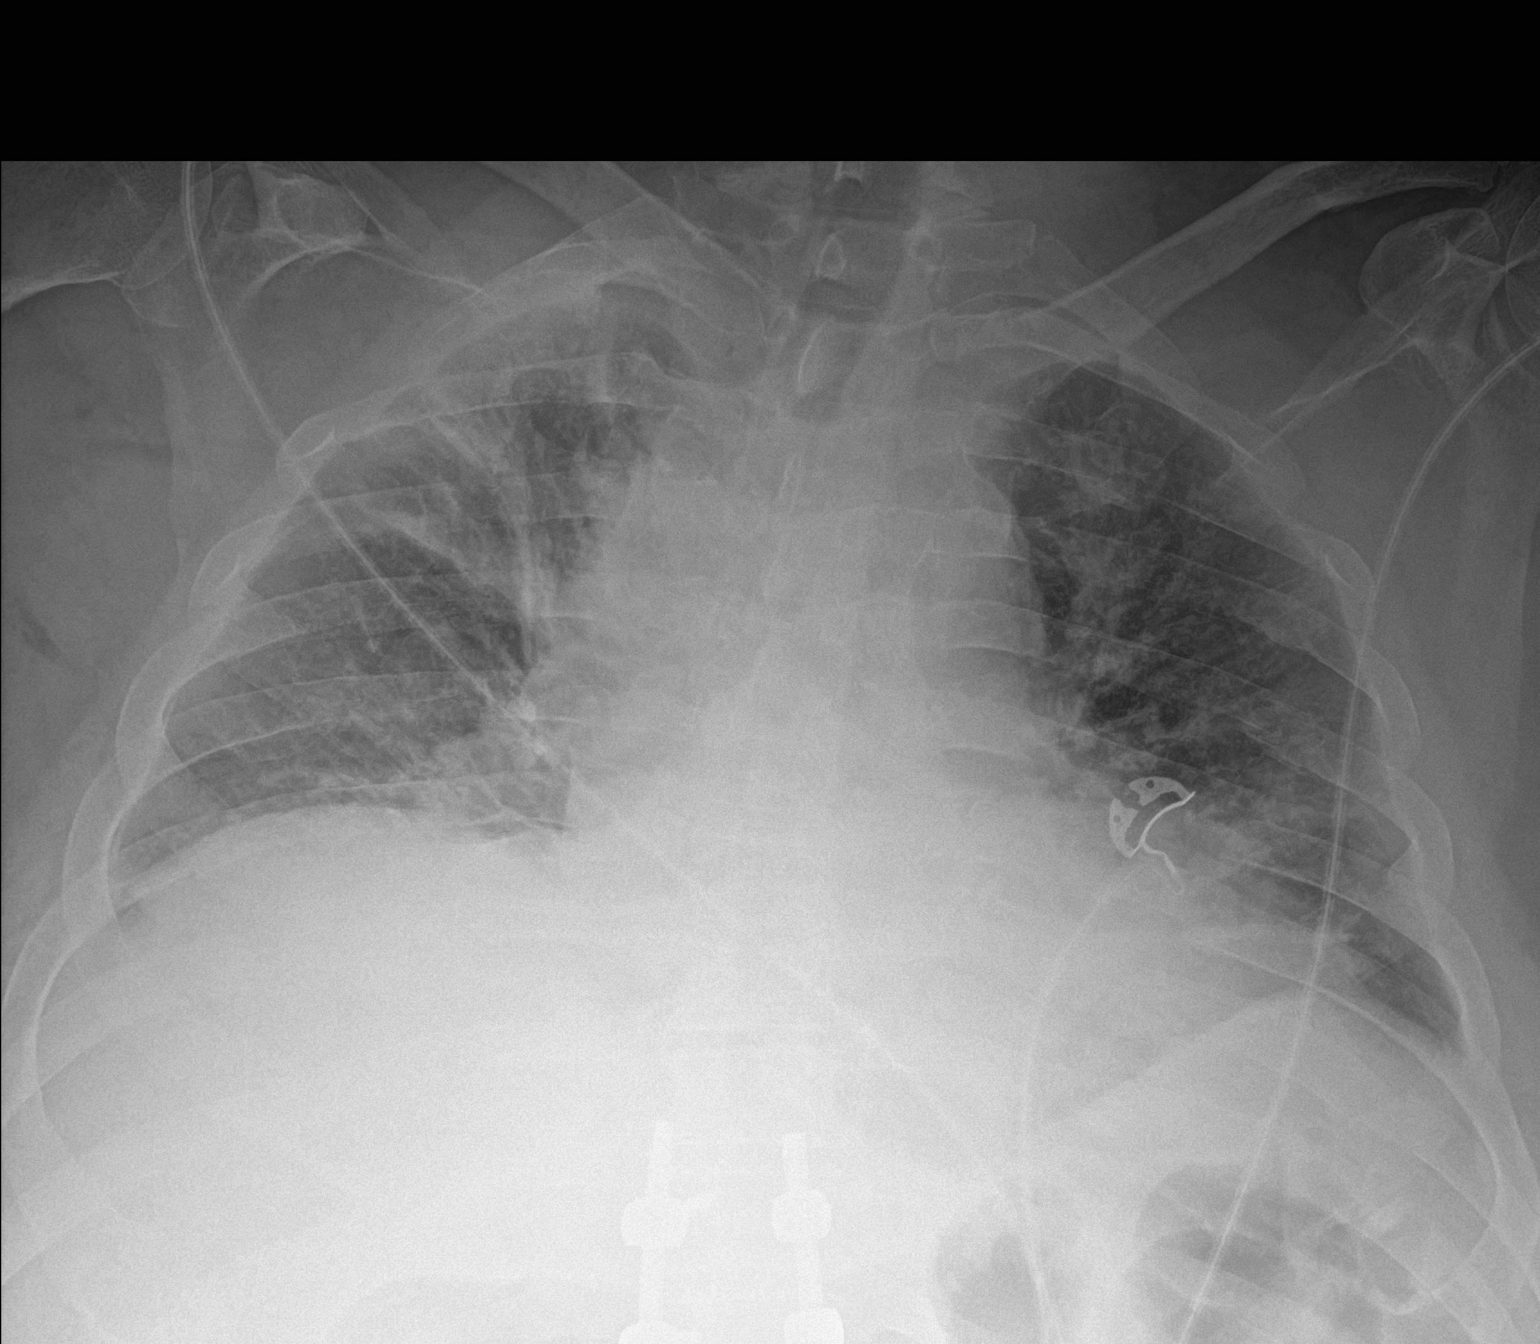

[1 of 1 positions shown; findings below may reference images not displayed]

FINDINGS: Low lung volumes. Bilateral posterior spinal fusion hardware
partially visualized in the lumbar spine. Stable cardiomediastinal
silhouette with normal heart size. No pneumothorax. No pleural
effusion. Slightly improved aeration of the right upper lobe with
persistent volume loss and hazy right upper lobe opacity. Streaky
bibasilar lung opacities, similar.
IMPRESSION: 1. Low lung volumes. Slightly improved aeration of the right upper
lobe with persistent volume loss and hazy right upper lobe opacity,
favor atelectasis.
2. Stable streaky bibasilar lung opacities, favor atelectasis.

## 2022-05-31 DIAGNOSIS — H25013 Cortical age-related cataract, bilateral: Secondary | ICD-10-CM | POA: Diagnosis not present

## 2022-05-31 DIAGNOSIS — H2513 Age-related nuclear cataract, bilateral: Secondary | ICD-10-CM | POA: Diagnosis not present

## 2022-05-31 DIAGNOSIS — H25043 Posterior subcapsular polar age-related cataract, bilateral: Secondary | ICD-10-CM | POA: Diagnosis not present

## 2022-05-31 DIAGNOSIS — H2511 Age-related nuclear cataract, right eye: Secondary | ICD-10-CM | POA: Diagnosis not present

## 2022-05-31 DIAGNOSIS — H35411 Lattice degeneration of retina, right eye: Secondary | ICD-10-CM | POA: Diagnosis not present

## 2022-06-05 IMAGING — US US ABDOMEN LIMITED
1 series · 14 of 25 positions shown · non-contrast
Comparison: None.

CLINICAL DATA: Abdominal pain

EXAM:
ULTRASOUND ABDOMEN LIMITED RIGHT UPPER QUADRANT

[Series 1: us abdomen limited ruq (liver/gb) · 14 of 50 slices shown]
[im 1/50]
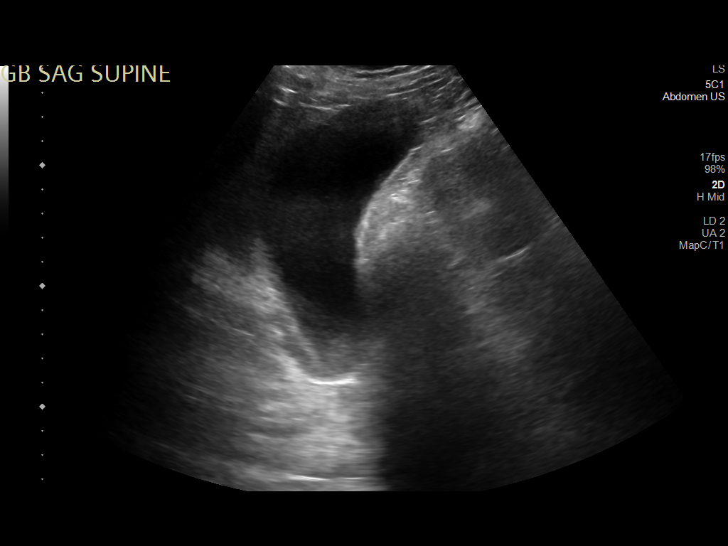
[im 5/50]
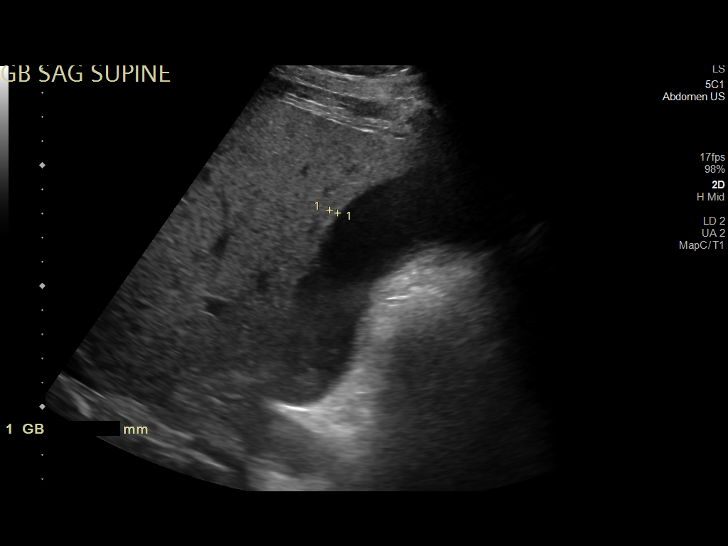
[im 9/50]
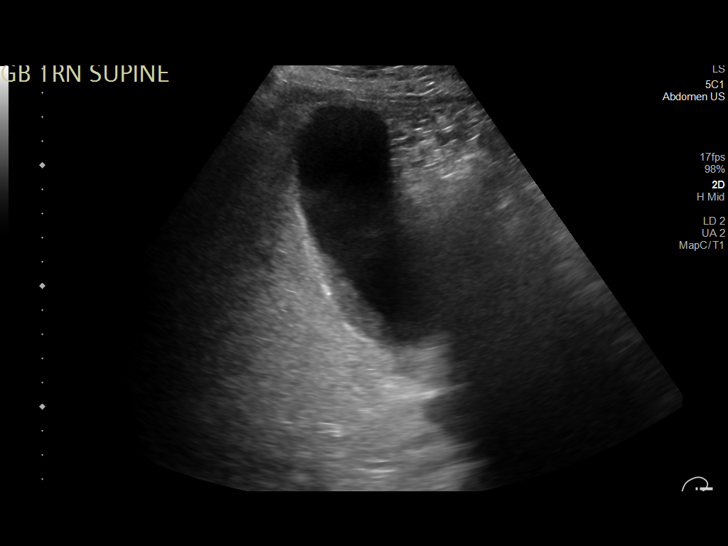
[im 13/50]
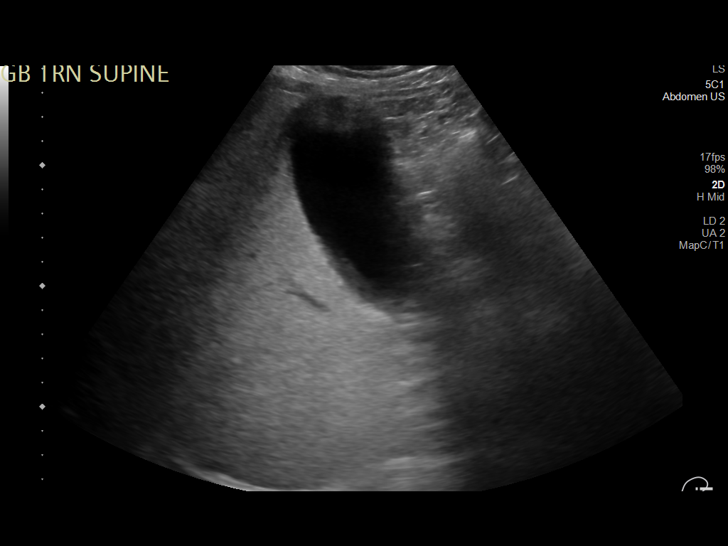
[im 17/50]
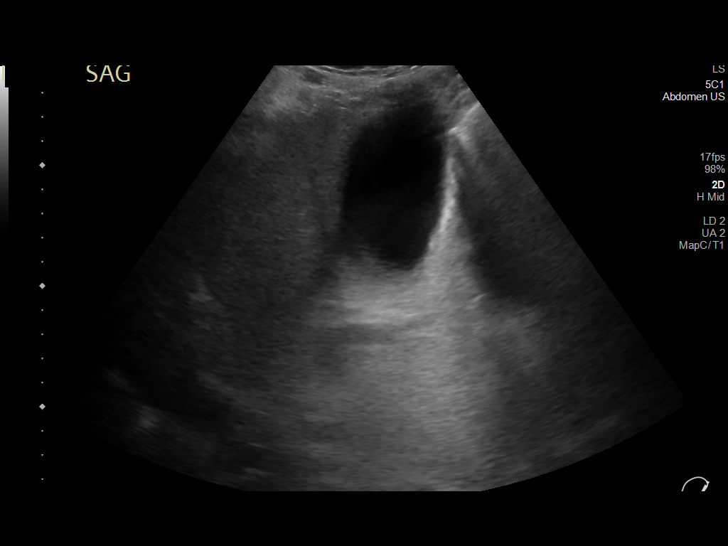
[im 19/50]
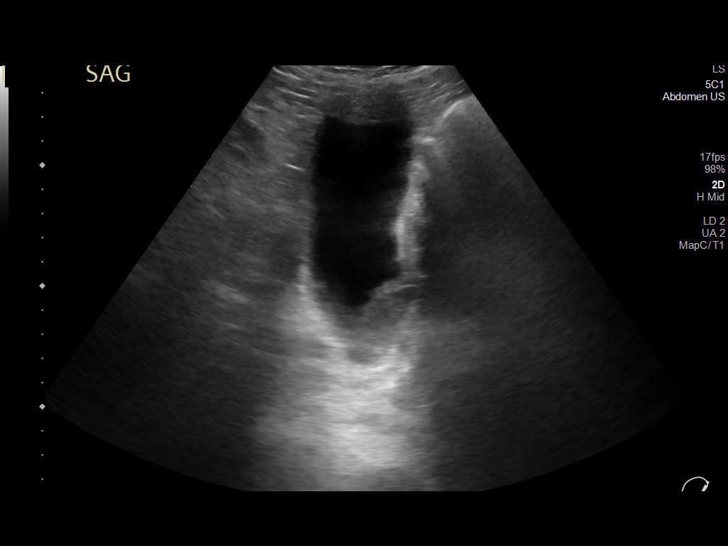
[im 23/50]
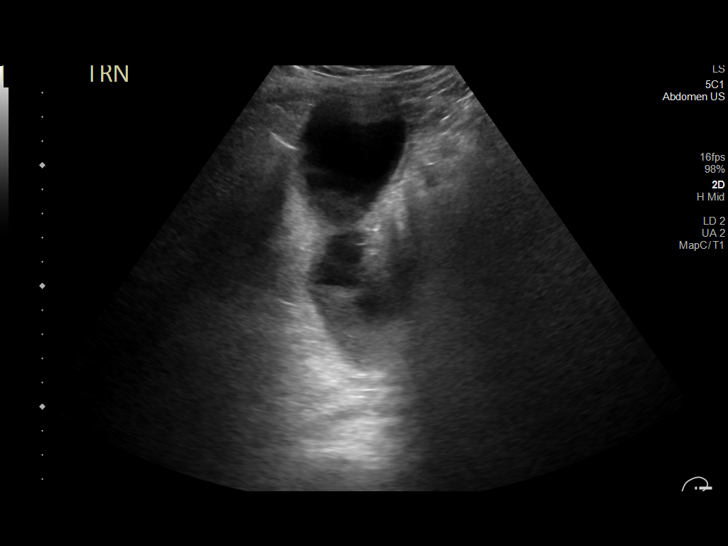
[im 27/50]
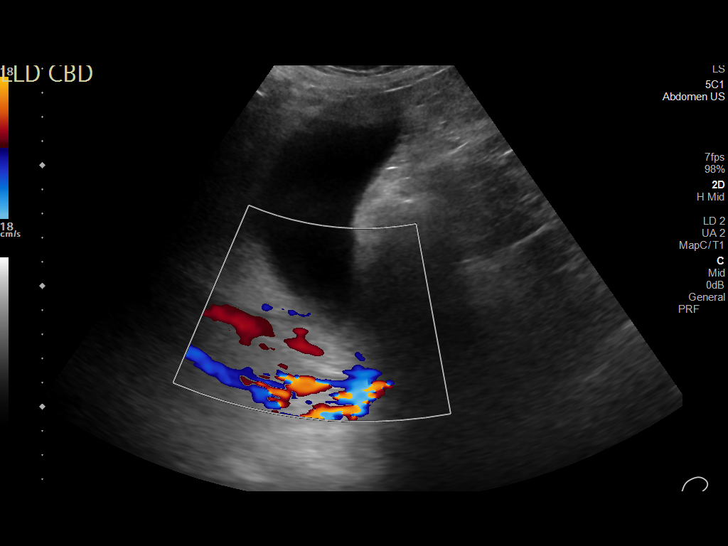
[im 31/50]
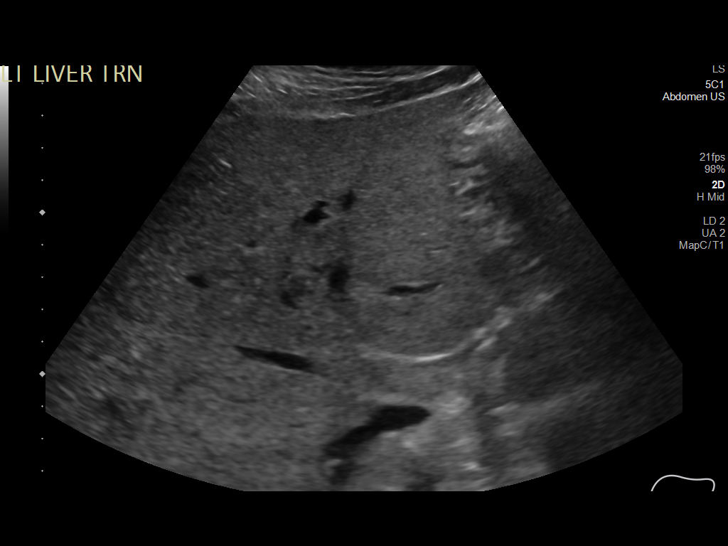
[im 33/50]
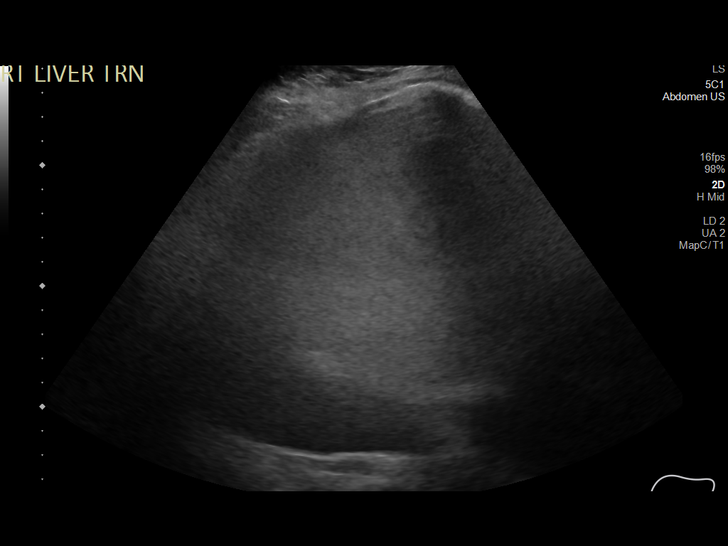
[im 37/50]
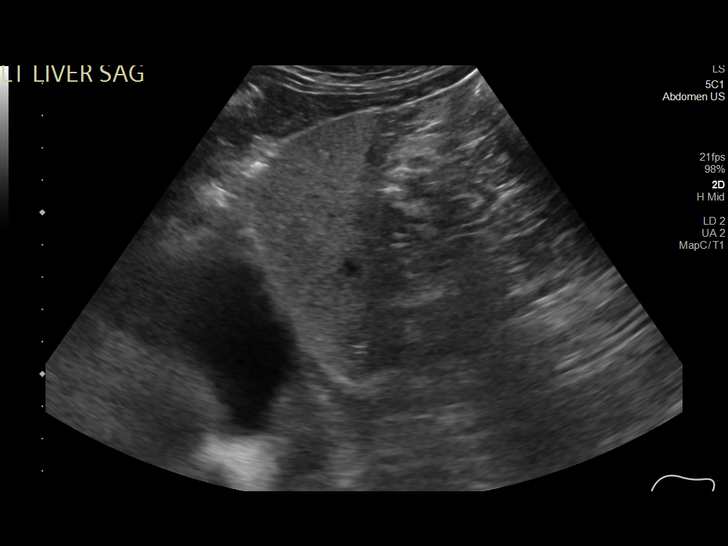
[im 41/50]
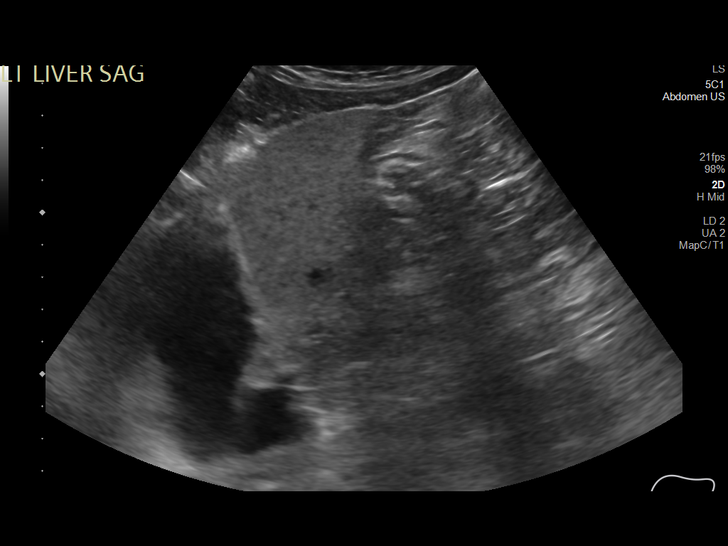
[im 45/50]
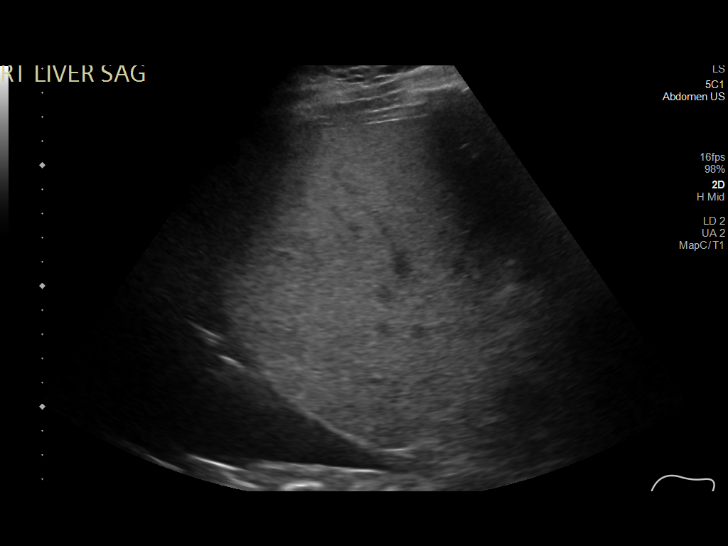
[im 50/50]
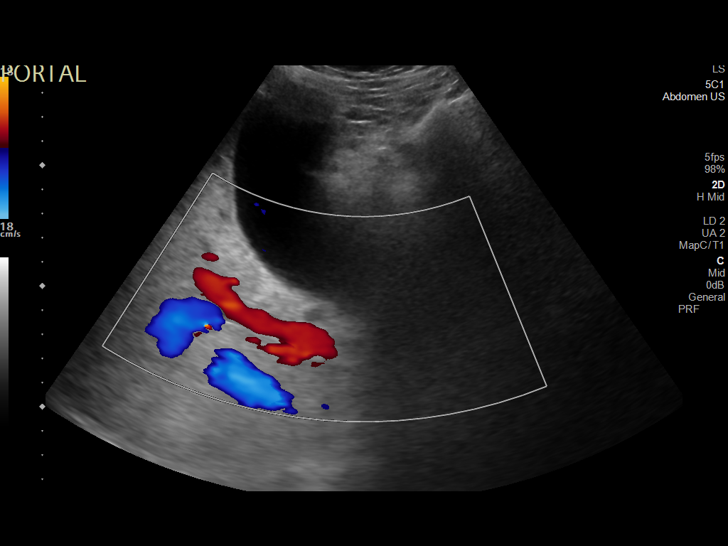

[14 of 25 positions shown; findings below may reference images not displayed]

FINDINGS: Gallbladder:

No gallstones. Gallbladder sludge identified. Negative sonographic
Murphy's sign. Mild gallbladder wall thickening measuring 3.45 mm.

Common bile duct:

Diameter: 3.24 mm

Liver:

Increased parenchymal echogenicity suggest hepatic steatosis. Portal
vein is patent on color Doppler imaging with normal direction of
blood flow towards the liver.

Other: Right pleural effusion noted.
IMPRESSION: 1. Gallbladder sludge and mild gallbladder wall thickening. No
gallstones noted. Negative sonographic Murphy's sign.
2. Hepatic steatosis.
3. Right pleural effusion.

## 2022-06-10 ENCOUNTER — Other Ambulatory Visit: Payer: Self-pay | Admitting: Family Medicine

## 2022-06-11 ENCOUNTER — Telehealth: Payer: Self-pay | Admitting: Family Medicine

## 2022-06-11 NOTE — Telephone Encounter (Signed)
Per MyChart message--  pt states he has a physical coming up in March, however he needs a refill on Lisinopril-HCTZ and Atorvastatin sent to [facility] on [address]

## 2022-06-12 ENCOUNTER — Other Ambulatory Visit: Payer: Self-pay

## 2022-06-12 MED ORDER — ATORVASTATIN CALCIUM 20 MG PO TABS: 20.0000 mg | ORAL_TABLET | Freq: Every day | ORAL | 0 refills | Status: DC

## 2022-06-12 MED ORDER — LISINOPRIL-HYDROCHLOROTHIAZIDE 20-12.5 MG PO TABS: 1.0000 | ORAL_TABLET | Freq: Every day | ORAL | 0 refills | Status: DC

## 2022-06-12 NOTE — Telephone Encounter (Signed)
Pt Rx sent

## 2022-06-26 ENCOUNTER — Ambulatory Visit (INDEPENDENT_AMBULATORY_CARE_PROVIDER_SITE_OTHER): Payer: Medicare HMO | Admitting: Family Medicine

## 2022-06-26 ENCOUNTER — Encounter: Payer: Self-pay | Admitting: Family Medicine

## 2022-06-26 VITALS — BP 126/84 | HR 60 | Temp 97.8°F | Ht 67.75 in | Wt 210.0 lb

## 2022-06-26 DIAGNOSIS — E1165 Type 2 diabetes mellitus with hyperglycemia: Secondary | ICD-10-CM

## 2022-06-26 DIAGNOSIS — N401 Enlarged prostate with lower urinary tract symptoms: Secondary | ICD-10-CM

## 2022-06-26 DIAGNOSIS — F411 Generalized anxiety disorder: Secondary | ICD-10-CM

## 2022-06-26 DIAGNOSIS — G459 Transient cerebral ischemic attack, unspecified: Secondary | ICD-10-CM | POA: Diagnosis not present

## 2022-06-26 DIAGNOSIS — I10 Essential (primary) hypertension: Secondary | ICD-10-CM | POA: Diagnosis not present

## 2022-06-26 DIAGNOSIS — E785 Hyperlipidemia, unspecified: Secondary | ICD-10-CM

## 2022-06-26 DIAGNOSIS — I6522 Occlusion and stenosis of left carotid artery: Secondary | ICD-10-CM

## 2022-06-26 DIAGNOSIS — Z23 Encounter for immunization: Secondary | ICD-10-CM

## 2022-06-26 DIAGNOSIS — N138 Other obstructive and reflux uropathy: Secondary | ICD-10-CM | POA: Diagnosis not present

## 2022-06-26 LAB — HEPATIC FUNCTION PANEL
ALT: 43 U/L (ref 0–53)
AST: 18 U/L (ref 0–37)
Albumin: 4.2 g/dL (ref 3.5–5.2)
Alkaline Phosphatase: 97 U/L (ref 39–117)
Bilirubin, Direct: 0.1 mg/dL (ref 0.0–0.3)
Total Bilirubin: 0.6 mg/dL (ref 0.2–1.2)
Total Protein: 7.1 g/dL (ref 6.0–8.3)

## 2022-06-26 LAB — BASIC METABOLIC PANEL
BUN: 17 mg/dL (ref 6–23)
CO2: 25 mEq/L (ref 19–32)
Calcium: 9.6 mg/dL (ref 8.4–10.5)
Chloride: 97 mEq/L (ref 96–112)
Creatinine, Ser: 0.8 mg/dL (ref 0.40–1.50)
GFR: 91.74 mL/min (ref >60.00)
Glucose, Bld: 188 mg/dL — ABNORMAL HIGH (ref 70–99)
Potassium: 4 mEq/L (ref 3.5–5.1)
Sodium: 134 mEq/L — ABNORMAL LOW (ref 135–145)

## 2022-06-26 LAB — CBC WITH DIFFERENTIAL/PLATELET
Basophils Absolute: 0.1 10*3/uL (ref 0.0–0.1)
Basophils Relative: 0.8 % (ref 0.0–3.0)
Eosinophils Absolute: 0.2 10*3/uL (ref 0.0–0.7)
Eosinophils Relative: 3.4 % (ref 0.0–5.0)
HCT: 42.5 % (ref 39.0–52.0)
Hemoglobin: 14.8 g/dL (ref 13.0–17.0)
Lymphocytes Relative: 32.4 % (ref 12.0–46.0)
Lymphs Abs: 2.3 10*3/uL (ref 0.7–4.0)
MCHC: 34.9 g/dL (ref 30.0–36.0)
MCV: 88.7 fl (ref 78.0–100.0)
Monocytes Absolute: 0.8 10*3/uL (ref 0.1–1.0)
Monocytes Relative: 10.7 % (ref 3.0–12.0)
Neutro Abs: 3.7 10*3/uL (ref 1.4–7.7)
Neutrophils Relative %: 52.7 % (ref 43.0–77.0)
Platelets: 319 10*3/uL (ref 150.0–400.0)
RBC: 4.79 Mil/uL (ref 4.22–5.81)
RDW: 12.6 % (ref 11.5–15.5)
WBC: 7 10*3/uL (ref 4.0–10.5)

## 2022-06-26 LAB — TSH: TSH: 3.54 u[IU]/mL (ref 0.35–5.50)

## 2022-06-26 LAB — PSA: PSA: 1.23 ng/mL (ref 0.10–4.00)

## 2022-06-26 LAB — LIPID PANEL
Cholesterol: 121 mg/dL (ref 0–200)
HDL: 40.2 mg/dL (ref >39.00)
LDL Cholesterol: 55 mg/dL (ref 0–99)
NonHDL: 81.23
Total CHOL/HDL Ratio: 3
Triglycerides: 132 mg/dL (ref 0.0–149.0)
VLDL: 26.4 mg/dL (ref 0.0–40.0)

## 2022-06-26 LAB — HEMOGLOBIN A1C: Hgb A1c MFr Bld: 9.1 % — ABNORMAL HIGH (ref 4.6–6.5)

## 2022-06-26 MED ORDER — FLUOXETINE HCL 10 MG PO CAPS: 30.0000 mg | ORAL_CAPSULE | Freq: Every day | ORAL | 3 refills | Status: DC

## 2022-06-26 NOTE — Progress Notes (Signed)
Subjective:    Patient ID: Dean Alvarado, male    DOB: 1956/04/08, 67 y.o.   MRN: [id number]  HPI Here to follow up on issues. He has no complaints today. He admits to getting little exercise and he has put on some weight. His BP has been stable but he does not check his glucoses. His anxiety is well controlled.    Review of Systems  Constitutional: Negative.   HENT: Negative.    Eyes: Negative.   Respiratory: Negative.    Cardiovascular: Negative.   Gastrointestinal: Negative.   Genitourinary: Negative.   Musculoskeletal: Negative.   Skin: Negative.   Neurological: Negative.   Psychiatric/Behavioral: Negative.         Objective:   Physical Exam Constitutional:      General: He is not in acute distress.    Appearance: Normal appearance. He is well-developed. He is not diaphoretic.  HENT:     Head: Normocephalic and atraumatic.     Right Ear: External ear normal.     Left Ear: External ear normal.     Nose: Nose normal.     Mouth/Throat:     Pharynx: No oropharyngeal exudate.  Eyes:     General: No scleral icterus.       Right eye: No discharge.        Left eye: No discharge.     Conjunctiva/sclera: Conjunctivae normal.     Pupils: Pupils are equal, round, and reactive to light.  Neck:     Thyroid: No thyromegaly.     Vascular: No JVD.     Trachea: No tracheal deviation.  Cardiovascular:     Rate and Rhythm: Normal rate and regular rhythm.     Heart sounds: Normal heart sounds. No murmur heard.    No friction rub. No gallop.  Pulmonary:     Effort: Pulmonary effort is normal. No respiratory distress.     Breath sounds: Normal breath sounds. No wheezing or rales.  Chest:     Chest wall: No tenderness.  Abdominal:     General: Bowel sounds are normal. There is no distension.     Palpations: Abdomen is soft. There is no mass.     Tenderness: There is no abdominal tenderness. There is no guarding or rebound.  Genitourinary:    Penis: Normal. No tenderness.       Testes: Normal.     Prostate: Normal.     Rectum: Normal. Guaiac result negative.  Musculoskeletal:        General: No tenderness. Normal range of motion.     Cervical back: Neck supple.  Lymphadenopathy:     Cervical: No cervical adenopathy.  Skin:    General: Skin is warm and dry.     Coloration: Skin is not pale.     Findings: No erythema or rash.  Neurological:     Mental Status: He is alert and oriented to person, place, and time.     Cranial Nerves: No cranial nerve deficit.     Motor: No abnormal muscle tone.     Coordination: Coordination normal.     Deep Tendon Reflexes: Reflexes are normal and symmetric. Reflexes normal.  Psychiatric:        Behavior: Behavior normal.        Thought Content: Thought content normal.        Judgment: Judgment normal.           Assessment & Plan:  His HTN and anxiety are stable. I  advised him to exercise and lose a little weight. We will get fasting labs to check lipids, an A1c, etc. We spent a total of ( 32  ) minutes reviewing records and discussing these issues.  Alysia Penna, MD

## 2022-06-26 NOTE — Addendum Note (Signed)
Addended by: Agnes Lawrence on: 06/26/2022 03:53 PM   Modules accepted: Orders

## 2022-07-18 DIAGNOSIS — H2511 Age-related nuclear cataract, right eye: Secondary | ICD-10-CM | POA: Diagnosis not present

## 2022-07-19 DIAGNOSIS — Z961 Presence of intraocular lens: Secondary | ICD-10-CM | POA: Diagnosis not present

## 2022-08-01 DIAGNOSIS — H2512 Age-related nuclear cataract, left eye: Secondary | ICD-10-CM | POA: Diagnosis not present

## 2022-08-01 DIAGNOSIS — H25042 Posterior subcapsular polar age-related cataract, left eye: Secondary | ICD-10-CM | POA: Diagnosis not present

## 2022-08-01 DIAGNOSIS — H25012 Cortical age-related cataract, left eye: Secondary | ICD-10-CM | POA: Diagnosis not present

## 2022-09-08 ENCOUNTER — Other Ambulatory Visit: Payer: Self-pay | Admitting: Family Medicine

## 2022-09-09 ENCOUNTER — Other Ambulatory Visit: Payer: Self-pay | Admitting: Family Medicine

## 2022-10-08 ENCOUNTER — Ambulatory Visit (INDEPENDENT_AMBULATORY_CARE_PROVIDER_SITE_OTHER): Payer: Medicare HMO | Admitting: Family Medicine

## 2022-10-08 ENCOUNTER — Encounter: Payer: Self-pay | Admitting: Family Medicine

## 2022-10-08 VITALS — BP 124/80 | HR 78 | Temp 98.2°F | Wt 201.0 lb

## 2022-10-08 DIAGNOSIS — E1165 Type 2 diabetes mellitus with hyperglycemia: Secondary | ICD-10-CM

## 2022-10-08 LAB — POCT GLYCOSYLATED HEMOGLOBIN (HGB A1C): Hemoglobin A1C: 6.6 % — AB (ref 4.0–5.6)

## 2022-10-08 MED ORDER — METFORMIN HCL 500 MG PO TABS: ORAL_TABLET | ORAL | 3 refills | Status: DC

## 2022-10-08 NOTE — Progress Notes (Signed)
   Subjective:    Patient ID: Dean Alvarado, male    DOB: [birth year], 67 y.o.   MRN: [id number]  HPI Here to follow up on diabetes. At his well exam in March, his A1c was up to 9.1%. we encouraged him to change his diet and exercise, and he has done just that. He has lost 9 lbs, and he feels better. His A1c today is down to 6.65.    Review of Systems  Constitutional: Negative.   Respiratory: Negative.    Cardiovascular: Negative.        Objective:   Physical Exam Constitutional:      Appearance: Normal appearance.  Cardiovascular:     Rate and Rhythm: Normal rate and regular rhythm.     Pulses: Normal pulses.     Heart sounds: Normal heart sounds.  Pulmonary:     Effort: Pulmonary effort is normal.     Breath sounds: Normal breath sounds.  Neurological:     Mental Status: He is alert.           Assessment & Plan:  Type 2 diabetes, now well controlled. We will continue on the current regimen. He will have another well exam next spring.  Gershon Crane, MD

## 2022-10-10 LAB — HM DIABETES EYE EXAM

## 2022-11-04 DIAGNOSIS — H35411 Lattice degeneration of retina, right eye: Secondary | ICD-10-CM | POA: Diagnosis not present

## 2022-11-04 DIAGNOSIS — H26491 Other secondary cataract, right eye: Secondary | ICD-10-CM | POA: Diagnosis not present

## 2022-11-26 ENCOUNTER — Encounter: Payer: Self-pay | Admitting: Family Medicine

## 2022-12-21 ENCOUNTER — Other Ambulatory Visit: Payer: Self-pay | Admitting: Family Medicine

## 2022-12-25 MED ORDER — ATORVASTATIN CALCIUM 20 MG PO TABS: 20.0000 mg | ORAL_TABLET | Freq: Every day | ORAL | 0 refills | Status: DC

## 2023-03-12 ENCOUNTER — Other Ambulatory Visit: Payer: Self-pay | Admitting: Family Medicine

## 2023-03-13 ENCOUNTER — Encounter: Payer: Self-pay | Admitting: Family Medicine

## 2023-03-13 ENCOUNTER — Ambulatory Visit: Payer: Medicare HMO | Admitting: Family Medicine

## 2023-03-13 VITALS — BP 124/90 | HR 80 | Temp 98.3°F | Wt 206.0 lb

## 2023-03-13 DIAGNOSIS — G4762 Sleep related leg cramps: Secondary | ICD-10-CM

## 2023-03-13 MED ORDER — LORAZEPAM 1 MG PO TABS: 1.0000 mg | ORAL_TABLET | Freq: Two times a day (BID) | ORAL | 2 refills | Status: DC | PRN

## 2023-03-13 NOTE — Progress Notes (Signed)
   Subjective:    Patient ID: BABAJIDE RUFENACHT, male    DOB: June 03, 1955, 67 y.o.   MRN: [id number]  HPI Here for 4 weeks of intermittent cramps in the left calf in bed at night. This happens about 3-4 times a week. There is no swelling. He walks 4-5 days a week but he does not run. He drinks plenty of water daily. His potassium in March was normal.    Review of Systems  Constitutional: Negative.   Respiratory: Negative.    Cardiovascular: Negative.   Musculoskeletal:  Positive for myalgias.       Objective:   Physical Exam Constitutional:      Appearance: Normal appearance.  Cardiovascular:     Rate and Rhythm: Normal rate and regular rhythm.     Pulses: Normal pulses.     Heart sounds: Normal heart sounds.  Musculoskeletal:     Right lower leg: No edema.     Left lower leg: No edema.     Comments: The proximal lateral left calf is mildly tender. No cords are felt. Denna Haggard is negative.   Neurological:     Mental Status: He is alert.           Assessment & Plan:  Nocturnal leg cramps. He will take 400-800 mg of OTC magnesium at bedtime.  Gershon Crane, MD

## 2023-03-18 ENCOUNTER — Other Ambulatory Visit: Payer: Self-pay | Admitting: Family Medicine

## 2023-03-19 MED ORDER — ATORVASTATIN CALCIUM 20 MG PO TABS: 20.0000 mg | ORAL_TABLET | Freq: Every day | ORAL | 0 refills | Status: DC

## 2023-04-17 ENCOUNTER — Encounter: Payer: Medicare HMO | Admitting: Family Medicine

## 2023-05-07 ENCOUNTER — Ambulatory Visit: Payer: Self-pay | Admitting: Family Medicine

## 2023-05-07 NOTE — Telephone Encounter (Signed)
Make an OV so we can discuss this  

## 2023-05-07 NOTE — Telephone Encounter (Signed)
  Chief Complaint: High blood sugar Symptoms: Mild leg cramps x 2 days Frequency: Today Pertinent Negatives: Patient denies dizziness, N/V, abd pain, SOB Disposition: [] ED /[] Urgent Care (no appt availability in office) / [] Appointment(In office/virtual)/ []  [facility]/ [x] Home Care/ [] Refused Recommended Disposition /[] [facility]/ [x]  Follow-up with PCP Additional Notes: Pt calls this morning to report he checked his BG this AM and it is ranging between 300-310. Pt reports he does have DM but he is not consistent in checking his BG levels consistently, he notes he takes Metformin  daily and has been consistent with that. Pt denies N/V, abd pain, dizziness, SOB, HA. This RN educated pt on home care to help reduce BG levels, also advised pt to begin monitoring BG levels at least once daily but ideally fasting and HS daily. Pt advised to keep a log for 5-7 days to take with him to next visit so adjustments can be made by the provider if needed, he agrees. This RN educated pt on new-worsening symptoms, when to call back/seek emergent care. Pt verbalized understanding and agrees to plan. Forwarding to provider for review.   Copied from CRM [id number]. Topic: Clinical - Red Word Triage >> May 07, 2023 11:58 AM Elita Guitar wrote: Red Word that prompted transfer to Nurse Triage: pt called and stated that he is diabetic and his blood sugar read 300. He stated he has been having leg cramps. Reason for Disposition  [1] Blood glucose > 300 mg/dL (04.5 mmol/L) AND [4] two or more times in a row  Answer Assessment - Initial Assessment Questions 1. BLOOD GLUCOSE: "What is your blood glucose level?"      300-310 2. ONSET: "When did you check the blood glucose?"     1155 3. USUAL RANGE: "What is your glucose level usually?" (e.g., usual fasting morning value, usual evening value)     Doesn't check often AM 175 roughly 5. TYPE 1 or 2:  "Do you know what type of diabetes you have?"  (e.g., Type  1, Type 2, Gestational; doesn't know)      Type 2 6. INSULIN : "Do you take insulin ?" "What type of insulin (s) do you use? What is the mode of delivery? (syringe, pen; injection or pump)?"      None 7. DIABETES PILLS: "Do you take any pills for your diabetes?" If Yes, ask: "Have you missed taking any pills recently?"     Metformin , no missed doses 8. OTHER SYMPTOMS: "Do you have any symptoms?" (e.g., fever, frequent urination, difficulty breathing, dizziness, weakness, vomiting)     Leg cramps x2 days  Protocols used: Diabetes - High Blood Sugar-A-AH

## 2023-05-07 NOTE — Telephone Encounter (Signed)
 Pt has been scheduled for OV tomorrow 05/08/23 per Dr Alyne Babinski

## 2023-05-08 ENCOUNTER — Ambulatory Visit: Payer: Medicare HMO | Admitting: Family Medicine

## 2023-05-08 ENCOUNTER — Encounter: Payer: Self-pay | Admitting: Family Medicine

## 2023-05-08 VITALS — BP 110/78 | HR 80 | Wt 207.0 lb

## 2023-05-08 DIAGNOSIS — E1165 Type 2 diabetes mellitus with hyperglycemia: Secondary | ICD-10-CM | POA: Diagnosis not present

## 2023-05-08 DIAGNOSIS — Z7984 Long term (current) use of oral hypoglycemic drugs: Secondary | ICD-10-CM

## 2023-05-08 LAB — POCT GLYCOSYLATED HEMOGLOBIN (HGB A1C): Hemoglobin A1C: 7.3 % — AB (ref 4.0–5.6)

## 2023-05-08 MED ORDER — METFORMIN HCL 500 MG PO TABS: 1000.0000 mg | ORAL_TABLET | Freq: Two times a day (BID) | ORAL | Status: DC

## 2023-05-08 NOTE — Progress Notes (Signed)
   Subjective:    Patient ID: Dean Alvarado, male    DOB: 10/07/1955, 68 y.o.   MRN: [id number]  HPI Here with concerns about rising blood glucoses. His last A1c here last June was 6.6%. he had not been checking his glucoses at home very often, but he started checking them again 2 weeks ago. His am fasting glucoses have been running 280-300. He feels fine. He admits to eating more carbs recently than he used to, and he has gained about 6 lbs in the last few months. His A1c today is 7.3%.    Review of Systems  Constitutional: Negative.   Respiratory: Negative.    Cardiovascular: Negative.        Objective:   Physical Exam Constitutional:      Appearance: Normal appearance.  Cardiovascular:     Rate and Rhythm: Normal rate and regular rhythm.     Pulses: Normal pulses.     Heart sounds: Normal heart sounds.  Pulmonary:     Effort: Pulmonary effort is normal.     Breath sounds: Normal breath sounds.  Neurological:     Mental Status: He is alert.           Assessment & Plan:  His type 2 diabetes is a little out of control. He will watch his diet more closely, and we will increase the Metformin to 1000 mg BID. He will monitor the glucoses at home for 2-3 weeks and then will report back to [place].  Gershon Crane, MD

## 2023-06-03 ENCOUNTER — Encounter (INDEPENDENT_AMBULATORY_CARE_PROVIDER_SITE_OTHER): Payer: Medicare HMO | Admitting: Family Medicine

## 2023-06-03 NOTE — Progress Notes (Signed)
error 

## 2023-06-23 ENCOUNTER — Other Ambulatory Visit: Payer: Self-pay | Admitting: Family Medicine

## 2023-06-25 ENCOUNTER — Other Ambulatory Visit: Payer: Self-pay | Admitting: Family Medicine

## 2023-06-25 ENCOUNTER — Encounter: Payer: Self-pay | Admitting: Family Medicine

## 2023-06-27 ENCOUNTER — Ambulatory Visit: Payer: Medicare HMO | Admitting: Family Medicine

## 2023-06-27 ENCOUNTER — Telehealth: Payer: Self-pay

## 2023-06-27 ENCOUNTER — Other Ambulatory Visit: Payer: Self-pay

## 2023-06-27 ENCOUNTER — Encounter: Payer: Self-pay | Admitting: Family Medicine

## 2023-06-27 VITALS — BP 120/78 | HR 73 | Temp 97.7°F | Ht 68.0 in | Wt 208.0 lb

## 2023-06-27 DIAGNOSIS — N401 Enlarged prostate with lower urinary tract symptoms: Secondary | ICD-10-CM | POA: Diagnosis not present

## 2023-06-27 DIAGNOSIS — I6522 Occlusion and stenosis of left carotid artery: Secondary | ICD-10-CM | POA: Diagnosis not present

## 2023-06-27 DIAGNOSIS — G459 Transient cerebral ischemic attack, unspecified: Secondary | ICD-10-CM | POA: Diagnosis not present

## 2023-06-27 DIAGNOSIS — N138 Other obstructive and reflux uropathy: Secondary | ICD-10-CM

## 2023-06-27 DIAGNOSIS — E1165 Type 2 diabetes mellitus with hyperglycemia: Secondary | ICD-10-CM

## 2023-06-27 DIAGNOSIS — I10 Essential (primary) hypertension: Secondary | ICD-10-CM | POA: Diagnosis not present

## 2023-06-27 DIAGNOSIS — E785 Hyperlipidemia, unspecified: Secondary | ICD-10-CM | POA: Diagnosis not present

## 2023-06-27 DIAGNOSIS — Z7984 Long term (current) use of oral hypoglycemic drugs: Secondary | ICD-10-CM

## 2023-06-27 DIAGNOSIS — F411 Generalized anxiety disorder: Secondary | ICD-10-CM

## 2023-06-27 LAB — CBC WITH DIFFERENTIAL/PLATELET
Basophils Absolute: 0.1 10*3/uL (ref 0.0–0.1)
Basophils Relative: 0.9 % (ref 0.0–3.0)
Eosinophils Absolute: 0.2 10*3/uL (ref 0.0–0.7)
Eosinophils Relative: 3.2 % (ref 0.0–5.0)
HCT: 40.4 % (ref 39.0–52.0)
Hemoglobin: 13.8 g/dL (ref 13.0–17.0)
Lymphocytes Relative: 23.6 % (ref 12.0–46.0)
Lymphs Abs: 1.6 10*3/uL (ref 0.7–4.0)
MCHC: 34.1 g/dL (ref 30.0–36.0)
MCV: 91.3 fl (ref 78.0–100.0)
Monocytes Absolute: 0.9 10*3/uL (ref 0.1–1.0)
Monocytes Relative: 13.3 % — ABNORMAL HIGH (ref 3.0–12.0)
Neutro Abs: 4.1 10*3/uL (ref 1.4–7.7)
Neutrophils Relative %: 59 % (ref 43.0–77.0)
Platelets: 307 10*3/uL (ref 150.0–400.0)
RBC: 4.43 Mil/uL (ref 4.22–5.81)
RDW: 13.6 % (ref 11.5–15.5)
WBC: 7 10*3/uL (ref 4.0–10.5)

## 2023-06-27 LAB — BASIC METABOLIC PANEL
BUN: 11 mg/dL (ref 6–23)
CO2: 24 meq/L (ref 19–32)
Calcium: 9.4 mg/dL (ref 8.4–10.5)
Chloride: 97 meq/L (ref 96–112)
Creatinine, Ser: 0.75 mg/dL (ref 0.40–1.50)
GFR: 92.89 mL/min (ref >60.00)
Glucose, Bld: 132 mg/dL — ABNORMAL HIGH (ref 70–99)
Potassium: 4.2 meq/L (ref 3.5–5.1)
Sodium: 133 meq/L — ABNORMAL LOW (ref 135–145)

## 2023-06-27 LAB — TSH: TSH: 2.17 u[IU]/mL (ref 0.35–5.50)

## 2023-06-27 LAB — HEMOGLOBIN A1C: Hgb A1c MFr Bld: 7.4 % — ABNORMAL HIGH (ref 4.6–6.5)

## 2023-06-27 LAB — LIPID PANEL
Cholesterol: 112 mg/dL (ref 0–200)
HDL: 47.5 mg/dL (ref >39.00)
LDL Cholesterol: 46 mg/dL (ref 0–99)
NonHDL: 64.86
Total CHOL/HDL Ratio: 2
Triglycerides: 95 mg/dL (ref 0.0–149.0)
VLDL: 19 mg/dL (ref 0.0–40.0)

## 2023-06-27 LAB — HEPATIC FUNCTION PANEL
ALT: 39 U/L (ref 0–53)
AST: 22 U/L (ref 0–37)
Albumin: 4.5 g/dL (ref 3.5–5.2)
Alkaline Phosphatase: 74 U/L (ref 39–117)
Bilirubin, Direct: 0.2 mg/dL (ref 0.0–0.3)
Total Bilirubin: 0.6 mg/dL (ref 0.2–1.2)
Total Protein: 7 g/dL (ref 6.0–8.3)

## 2023-06-27 LAB — PSA: PSA: 1.37 ng/mL (ref 0.10–4.00)

## 2023-06-27 MED ORDER — METFORMIN HCL 500 MG PO TABS
1000.0000 mg | ORAL_TABLET | Freq: Two times a day (BID) | ORAL | 3 refills | Status: DC
Start: 2023-06-27 — End: 2023-06-27

## 2023-06-27 MED ORDER — METFORMIN HCL 500 MG PO TABS: 1000.0000 mg | ORAL_TABLET | Freq: Two times a day (BID) | ORAL | 3 refills | Status: DC

## 2023-06-27 MED ORDER — GLIPIZIDE 5 MG PO TABS: 5.0000 mg | ORAL_TABLET | Freq: Every day | ORAL | 3 refills | Status: DC

## 2023-06-27 NOTE — Telephone Encounter (Signed)
 Pt Request corrected, pt pharmacy notified

## 2023-06-27 NOTE — Progress Notes (Signed)
 Subjective:    Patient ID: Dean Alvarado, male    DOB: 11-04-1955, 68 y.o.   MRN: [id number]  HPI Here to follow up on issues. He feels well. We met last month to check his diabetes, and his A1c was 7.3%. we increased the Metformin to 1000 mg BID. Since then his evening glucoses have averaged 150, but his am fasting glucoses are still in the range of 150-200. His BP is stable. His anxiety is stable.    Review of Systems  Constitutional: Negative.   HENT: Negative.    Eyes: Negative.   Respiratory: Negative.    Cardiovascular: Negative.   Gastrointestinal: Negative.   Genitourinary: Negative.   Musculoskeletal: Negative.   Skin: Negative.   Neurological: Negative.   Psychiatric/Behavioral: Negative.         Objective:   Physical Exam Constitutional:      General: He is not in acute distress.    Appearance: Normal appearance. He is well-developed. He is not diaphoretic.  HENT:     Head: Normocephalic and atraumatic.     Right Ear: External ear normal.     Left Ear: External ear normal.     Nose: Nose normal.     Mouth/Throat:     Pharynx: No oropharyngeal exudate.  Eyes:     General: No scleral icterus.       Right eye: No discharge.        Left eye: No discharge.     Conjunctiva/sclera: Conjunctivae normal.     Pupils: Pupils are equal, round, and reactive to light.  Neck:     Thyroid: No thyromegaly.     Vascular: No JVD.     Trachea: No tracheal deviation.  Cardiovascular:     Rate and Rhythm: Normal rate and regular rhythm.     Pulses: Normal pulses.     Heart sounds: Normal heart sounds. No murmur heard.    No friction rub. No gallop.  Pulmonary:     Effort: Pulmonary effort is normal. No respiratory distress.     Breath sounds: Normal breath sounds. No wheezing or rales.  Chest:     Chest wall: No tenderness.  Abdominal:     General: Bowel sounds are normal. There is no distension.     Palpations: Abdomen is soft. There is no mass.     Tenderness:  There is no abdominal tenderness. There is no guarding or rebound.  Genitourinary:    Penis: Normal. No tenderness.      Testes: Normal.     Prostate: Normal.     Rectum: Normal. Guaiac result negative.  Musculoskeletal:        General: No tenderness. Normal range of motion.     Cervical back: Neck supple.  Lymphadenopathy:     Cervical: No cervical adenopathy.  Skin:    General: Skin is warm and dry.     Coloration: Skin is not pale.     Findings: No erythema or rash.  Neurological:     General: No focal deficit present.     Mental Status: He is alert and oriented to person, place, and time.     Cranial Nerves: No cranial nerve deficit.     Motor: No abnormal muscle tone.     Coordination: Coordination normal.     Deep Tendon Reflexes: Reflexes are normal and symmetric. Reflexes normal.  Psychiatric:        Mood and Affect: Mood normal.        Behavior:  Behavior normal.        Thought Content: Thought content normal.        Judgment: Judgment normal.           Assessment & Plan:  He is doing well as far as HTN and anxiety. His diabetes is well controlled in the evenings, but his glucoses go up overnight. We will add Glipizide 5 mg every evening before supper. Get fasting labs to check lipids, etc. His will be due for another colonscopy in May. His last carotid US was in 2019, so we wil set up another one soon. We spent a total of (33   ) minutes reviewing records and discussing these issues.  Gershon Crane, MD

## 2023-06-27 NOTE — Telephone Encounter (Signed)
 Copied from CRM [id number]. Topic: Clinical - Prescription Issue >> Jun 27, 2023 11:42 AM Elizebeth Brooking wrote: Reason for CRM: Patient called stated that [facility] stated the medication metFORMIN (GLUCOPHAGE) 500 MG tablet directions needs to be updated in order to give the medication to the patient is requesting to send them the updated directions

## 2023-06-30 ENCOUNTER — Telehealth (HOSPITAL_COMMUNITY): Payer: Self-pay

## 2023-06-30 NOTE — Telephone Encounter (Signed)
 Attempted to contact the patient to schedule [facility] [place]. No answer. Left message. First Attempt Provided direct contact number for scheduling: [phone].

## 2023-07-02 NOTE — Addendum Note (Signed)
 Addended by: Johnella Moloney on: 07/02/2023 02:44 PM   Modules accepted: Orders

## 2023-07-21 ENCOUNTER — Ambulatory Visit (HOSPITAL_COMMUNITY)
Admission: RE | Admit: 2023-07-21 | Discharge: 2023-07-21 | Disposition: A | Discharge status: Home / Self Care | Source: Ambulatory Visit | Attending: Family Medicine | Admitting: Family Medicine

## 2023-07-21 DIAGNOSIS — I6522 Occlusion and stenosis of left carotid artery: Secondary | ICD-10-CM | POA: Diagnosis not present

## 2023-07-31 ENCOUNTER — Encounter: Payer: Self-pay | Admitting: Family Medicine

## 2023-08-01 MED ORDER — LORAZEPAM 1 MG PO TABS: 1.0000 mg | ORAL_TABLET | Freq: Two times a day (BID) | ORAL | 2 refills | Status: DC | PRN

## 2023-08-01 NOTE — Telephone Encounter (Signed)
 Done

## 2023-09-18 ENCOUNTER — Other Ambulatory Visit: Payer: Self-pay | Admitting: Family Medicine

## 2023-09-30 ENCOUNTER — Ambulatory Visit: Payer: Self-pay

## 2023-09-30 NOTE — Telephone Encounter (Signed)
 Summary: Fatigue   Copied From CRM [id number]. Reason for Triage: Fatigue     Reason for Disposition  [1] MILD weakness (i.e., does not interfere with ability to work, go to school, normal activities) AND [2] persists > 1 week    Offered pt appt: pt refused and requested home care advice. Home care advice provided: also informed pt is s/s continue or become worse to call us  back,  Answer Assessment - Initial Assessment Questions 1. DESCRIPTION: "Describe how you are feeling." Feeling fatigued daily past 2 weeks: however pt still exercising 3 weeks & good well, not drinking enough water. 2. SEVERITY: "How bad is it?"  "Can you stand and walk?"   - MILD (0-3): Feels weak or tired, but does not interfere with work, school or normal activities.   - MODERATE (4-7): Able to stand and walk; weakness interferes with work, school, or normal activities.   - SEVERE (8-10): Unable to stand or walk; unable to do usual activities.     moderate 3. ONSET: "When did these symptoms begin?" (e.g., hours, days, weeks, months)     X 2 weeks 4. CAUSE: "What do you think is causing the weakness or fatigue?" (e.g., not drinking enough fluids, medical problem, trouble sleeping)     Drinking a lot of Gatorade not a lot  5. NEW MEDICINES:  "Have you started on any new medicines recently?" (e.g., opioid pain medicines, benzodiazepines, muscle relaxants, antidepressants, antihistamines, neuroleptics, beta blockers)     Started 3 mg melatonin to help with sleep 6. OTHER SYMPTOMS: "Do you have any other symptoms?" (e.g., chest pain, fever, cough, SOB, vomiting, diarrhea, bleeding, other areas of pain)     no 7. PREGNANCY: "Is there any chance you are pregnant?" "When was your last menstrual period?"     N/a  Protocols used: Weakness (Generalized) and Fatigue-A-AH

## 2023-10-01 NOTE — Telephone Encounter (Signed)
 Left a message for the patient to return my call.

## 2023-10-13 DIAGNOSIS — H43813 Vitreous degeneration, bilateral: Secondary | ICD-10-CM | POA: Diagnosis not present

## 2023-10-13 DIAGNOSIS — Z961 Presence of intraocular lens: Secondary | ICD-10-CM | POA: Diagnosis not present

## 2023-10-13 DIAGNOSIS — E119 Type 2 diabetes mellitus without complications: Secondary | ICD-10-CM | POA: Diagnosis not present

## 2023-11-13 ENCOUNTER — Ambulatory Visit: Admitting: Family Medicine

## 2023-11-13 DIAGNOSIS — Z122 Encounter for screening for malignant neoplasm of respiratory organs: Secondary | ICD-10-CM

## 2023-11-13 DIAGNOSIS — Z7289 Other problems related to lifestyle: Secondary | ICD-10-CM | POA: Diagnosis not present

## 2023-11-13 DIAGNOSIS — Z0000 Encounter for general adult medical examination without abnormal findings: Secondary | ICD-10-CM | POA: Diagnosis not present

## 2023-11-13 DIAGNOSIS — Z87891 Personal history of nicotine dependence: Secondary | ICD-10-CM | POA: Diagnosis not present
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
## 2023-11-13 NOTE — Patient Instructions (Addendum)
 I really enjoyed getting to talk with you today! I am available on Tuesdays and Thursdays for virtual visits if you have any questions or concerns, or if I can be of any further assistance.   CHECKLIST FROM ANNUAL WELLNESS VISIT:  -Follow up (please call to schedule if not scheduled after visit):   -yearly for annual wellness visit with primary care office  Here is a list of your preventive care/health maintenance measures and the plan for each if any are due:  PLAN For any measures below that may be due:    1. Please call to schedule your colonoscopy: [phone]   2. I placed a referral for lung cancer screening. If you are not contacted about scheduling in the next 1-2 weeks please call our office: [phone]   3. Can request diabetic foot exam and diabetic kidney exam when you are in the office for your appointment.   4. Can get the flu and covid vaccines at the pharmacy in the fall.   Health Maintenance  Topic Date Due   FOOT EXAM  Never done   Diabetic kidney evaluation - Urine ACR  Never done   Lung Cancer Screening  02/19/2022   COVID-19 Vaccine (6 - 2024-25 season) 12/22/2022   Colonoscopy  08/29/2023   INFLUENZA VACCINE  11/21/2023   HEMOGLOBIN A1C  12/28/2023   Diabetic kidney evaluation - eGFR measurement  06/26/2024   OPHTHALMOLOGY EXAM  10/12/2024   Medicare Annual Wellness (AWV)  11/12/2024   DTaP/Tdap/Td (2 - Td or Tdap) 09/10/2027   Pneumococcal Vaccine: 50+ Years  Completed   Hepatitis C Screening  Completed   Zoster Vaccines- Shingrix  Completed   Hepatitis B Vaccines  Aged Out   HPV VACCINES  Aged Out   Meningococcal B Vaccine  Aged Out    -See a dentist at least yearly  -Get your eyes checked and then per your eye specialist's recommendations  -Other issues addressed today:   1. Please cut back on alcohol  use to no more than 2 drink in any given day. If any difficulty cutting back please schedule an appointment so that we can help.   2. Please  consider giving up vaping.   -I have included below further information regarding a healthy whole foods based diet, physical activity guidelines for adults, stress management and opportunities for social connections. I hope you find this information useful.   -----------------------------------------------------------------------------------------------------------------------------------------------------------------------------------------------------------------------------------------------------------    NUTRITION: -eat real food: lots of colorful vegetables (half the plate) and fruits -5-7 servings of vegetables and fruits per day (fresh or steamed is best), exp. 2 servings of vegetables with lunch and dinner and 2 servings of fruit per day. Berries and greens such as kale and collards are great choices.  -consume on a regular basis:  fresh fruits, fresh veggies, fish, nuts, seeds, healthy oils (such as olive oil, avocado oil), whole grains (make sure for bread/pasta/crackers/etc., that the first ingredient on label contains the word whole), legumes. -can eat small amounts of dairy and lean meat (no larger than the palm of your hand), but avoid processed meats such as ham, bacon, lunch meat, etc. -drink water -try to avoid fast food and pre-packaged foods, processed meat, ultra processed foods/beverages (donuts, candy, etc.) -most experts advise limiting sodium to < 2300mg  per day, should limit further is any chronic conditions such as high blood pressure, heart disease, diabetes, etc. The American Heart Association advised that < 1500mg  is is ideal -try to avoid foods/beverages that contain any ingredients with  names you do not recognize  -try to avoid foods/beverages  with added sugar or sweeteners/sweets  -try to avoid sweet drinks (including diet drinks): soda, juice, Gatorade, sweet tea, power drinks, diet drinks -try to avoid white rice, white bread, pasta (unless whole  grain)  EXERCISE GUIDELINES FOR ADULTS: -if you wish to increase your physical activity, do so gradually and with the approval of your doctor -STOP and seek medical care immediately if you have any chest pain, chest discomfort or trouble breathing when starting or increasing exercise  -move and stretch your body, legs, feet and arms when sitting for long periods -Physical activity guidelines for optimal health in adults: -get at least 150 minutes per week of moderate exercise (can talk, but not sing); this is about 20-30 minutes of sustained activity 5-7 days per week or two 10-15 minute episodes of sustained activity 5-7 days per week -do some muscle building/resistance training/strength training at least 2 days per week  -balance exercises 3+ days per week:   Stand somewhere where you have something sturdy to hold onto if you lose balance    1) lift up on toes, then back down, start with 5x per day and work up to 20x   2) stand and lift one leg straight out to the side so that foot is a few inches of the floor, start with 5x each side and work up to 20x each side   3) stand on one foot, start with 5 seconds each side and work up to 20 seconds on each side  If you need ideas or help with getting more active:  -Silver sneakers https://tools.silversneakers.com  -Walk with a Doc: http://www.duncan-williams.com/  -try to include resistance (weight lifting/strength building) and balance exercises twice per week: or the following link for ideas: http://castillo-powell.com/  BuyDucts.dk  STRESS MANAGEMENT: -can try meditating, or just sitting quietly with deep breathing while intentionally relaxing all parts of your body for 5 minutes daily -if you need further help with stress, anxiety or depression please follow up with your primary doctor or contact the wonderful folks at [facility]:  [phone]  SOCIAL CONNECTIONS: -options in [place] if you wish to engage in more social and exercise related activities:  -Silver sneakers https://tools.silversneakers.com  -Walk with a Doc: http://www.duncan-williams.com/  -Check out the [facility] Active Adults 50+ section on the [place] [employer] (hiking clubs, book clubs, cards and games, chess, exercise classes, aquatic classes and much more) - see the website for details: https://www.[place].gov/departments/parks-recreation/active-adults50  -YouTube has lots of exercise videos for different ages and abilities as well  -[facility] (a variety of indoor and outdoor inperson activities for adults). [phone]. [address]  -Virtual Online Classes (a variety of topics): see seniorplanet.org or call [phone]  -consider volunteering at a school, hospice center, church, senior center or elsewhere

## 2023-11-13 NOTE — Progress Notes (Signed)
 Patient unable to obtain vital signs due to telehealth visit

## 2023-11-20 ENCOUNTER — Telehealth: Payer: Self-pay

## 2023-11-20 DIAGNOSIS — Z122 Encounter for screening for malignant neoplasm of respiratory organs: Secondary | ICD-10-CM

## 2023-11-20 DIAGNOSIS — Z87891 Personal history of nicotine dependence: Secondary | ICD-10-CM

## 2023-11-20 NOTE — Telephone Encounter (Signed)
 Lung Cancer Screening Narrative/Criteria Questionnaire (Cigarette Smokers Only- No Cigars/Pipes/vapes)   Dean Alvarado   SDMV:11/25/2023 at 1:30 pm with [name] [name], RN   09-Sep-1955   LDCT: 11/26/2023 at 1:30 pm at [facility]     68 y.o.   Phone: [phone]  Lung Screening Narrative (confirm age 60-77 yrs Medicare / 50-80 yrs Private pay insurance)   Insurance information:[employer] Medicare   Referring Provider: Chiquita Cramp   This screening involves an initial phone call with a team member from our program. It is called a shared decision making visit. The initial meeting is required by  insurance and Medicare to make sure you understand the program. This appointment takes about 15-20 minutes to complete. You will complete the screening scan at your scheduled date/time.  This scan takes about 5-10 minutes to complete. You can eat and drink normally before and after the scan.  Criteria questions for Lung Cancer Screening:   Are you a current or former smoker? Former Age began smoking: 16   If you are a former smoker, what year did you quit smoking?Quit 2015 (within 15 yrs)   To calculate your smoking history, I need an accurate estimate of how many packs of cigarettes you smoked per day and for how many years. (Not just the number of PPD you are now smoking)   Years smoking 41 x Packs per day 1 = Pack years 41   (at least 20 pack yrs)   (Make sure they understand that we need to know how much they have smoked in the past, not just the number of PPD they are smoking now)  Do you have a personal history of cancer?  No    Do you have a family history of cancer? No  Are you coughing up blood?  No  Have you had unexplained weight loss of 15 lbs or more in the last 6 months? No  It looks like you meet all criteria.  When would be a good time for us  to schedule you for this screening?   Additional information: N/A

## 2023-11-25 ENCOUNTER — Encounter: Payer: Self-pay | Admitting: *Deleted

## 2023-11-25 ENCOUNTER — Ambulatory Visit: Admitting: *Deleted

## 2023-11-25 DIAGNOSIS — Z87891 Personal history of nicotine dependence: Secondary | ICD-10-CM

## 2023-11-25 NOTE — Patient Instructions (Signed)

## 2023-11-25 NOTE — Progress Notes (Signed)
 Virtual Visit via Telephone Note  I connected with Dean Alvarado on 11/25/23 at  1:30 PM EDT by telephone and verified that I am speaking with the correct person using two identifiers.  Location: Patient: in home Provider: [address], [place], [place], [address]    Shared Decision Making Visit Lung Cancer Screening Program [phone])   Eligibility: Age 68 y.o. Pack Years Smoking History Calculation 41 (# packs/per year x # years smoked) Recent History of coughing up blood  no Unexplained weight loss? no ( >Than 15 pounds within the last 6 months ) Prior History Lung / other cancer no (Diagnosis within the last 5 years already requiring surveillance chest CT Scans). Smoking Status Former Smoker Former Smokers: Years since quit: 1010 years  Quit Date: 09-20-13  Visit Components: Discussion included one or more decision making aids. yes Discussion included risk/benefits of screening. yes Discussion included potential follow up diagnostic testing for abnormal scans. yes Discussion included meaning and risk of over diagnosis. yes Discussion included meaning and risk of False Positives. yes Discussion included meaning of total radiation exposure. yes  Counseling Included: Importance of adherence to annual lung cancer LDCT screening. yes Impact of comorbidities on ability to participate in the program. yes Ability and willingness to under diagnostic treatment. yes  Smoking Cessation Counseling: Current Smokers:  Discussed importance of smoking cessation. yes Information about tobacco cessation classes and interventions provided to patient. yes Patient provided with ticket for LDCT Scan. yes Symptomatic Patient. no  Counseling NA Diagnosis Code: Tobacco Use Z72.0 Asymptomatic Patient yes  Counseling (Intermediate counseling: > three minutes counseling) H9563 Former Smokers:  Discussed the importance of maintaining cigarette abstinence. yes Diagnosis Code: Personal  History of Nicotine  Dependence. S12.108 Information about tobacco cessation classes and interventions provided to patient. Yes Patient provided with ticket for LDCT Scan. yes Written Order for Lung Cancer Screening with LDCT placed in Epic. Yes (CT Chest Lung Cancer Screening Low Dose W/O CM) [id number] Z12.2-Screening of respiratory organs Z87.891-Personal history of nicotine  dependence   Josette Ranger, RN 11/25/23

## 2023-11-26 ENCOUNTER — Ambulatory Visit (HOSPITAL_BASED_OUTPATIENT_CLINIC_OR_DEPARTMENT_OTHER)
Admission: RE | Admit: 2023-11-26 | Discharge: 2023-11-26 | Disposition: A | Discharge status: Home / Self Care | Source: Ambulatory Visit | Attending: Family Medicine | Admitting: Family Medicine

## 2023-11-26 DIAGNOSIS — Z87891 Personal history of nicotine dependence: Secondary | ICD-10-CM | POA: Diagnosis not present

## 2023-11-26 DIAGNOSIS — Z122 Encounter for screening for malignant neoplasm of respiratory organs: Secondary | ICD-10-CM | POA: Insufficient documentation

## 2023-12-05 ENCOUNTER — Other Ambulatory Visit: Payer: Self-pay | Admitting: Family Medicine

## 2023-12-08 ENCOUNTER — Telehealth: Payer: Self-pay

## 2023-12-08 NOTE — Telephone Encounter (Signed)
 Copied from CRM #[id number]. Topic: Clinical - Lab/Test Results >> Dec 08, 2023 10:42 AM Leila C wrote: Reason for CRM: Patient [phone] is looking for imaging results. Informed patient, across [facility] due to shortage of radiologists results can take 3-4 weeks. Patient verbalized understanding, will wait and denies any other concerns. Patient would like results via Mychart when finalize.  Patient is ok to wait until result's are finalized.Nothing else further needed.

## 2023-12-10 ENCOUNTER — Other Ambulatory Visit: Payer: Self-pay | Admitting: Acute Care

## 2023-12-10 DIAGNOSIS — Z87891 Personal history of nicotine dependence: Secondary | ICD-10-CM

## 2023-12-10 DIAGNOSIS — Z122 Encounter for screening for malignant neoplasm of respiratory organs: Secondary | ICD-10-CM

## 2023-12-16 ENCOUNTER — Encounter: Payer: Self-pay | Admitting: Family Medicine

## 2023-12-18 ENCOUNTER — Other Ambulatory Visit: Payer: Self-pay | Admitting: Family Medicine

## 2023-12-25 ENCOUNTER — Other Ambulatory Visit: Payer: Self-pay | Admitting: Family Medicine

## 2024-01-14 ENCOUNTER — Encounter: Payer: Self-pay | Admitting: Family Medicine

## 2024-01-14 ENCOUNTER — Ambulatory Visit: Admitting: Family Medicine

## 2024-01-14 VITALS — BP 136/80 | HR 95 | Temp 98.0°F | Wt 212.0 lb

## 2024-01-14 DIAGNOSIS — E785 Hyperlipidemia, unspecified: Secondary | ICD-10-CM

## 2024-01-14 DIAGNOSIS — N138 Other obstructive and reflux uropathy: Secondary | ICD-10-CM

## 2024-01-14 DIAGNOSIS — Z23 Encounter for immunization: Secondary | ICD-10-CM | POA: Diagnosis not present

## 2024-01-14 DIAGNOSIS — I10 Essential (primary) hypertension: Secondary | ICD-10-CM

## 2024-01-14 DIAGNOSIS — F411 Generalized anxiety disorder: Secondary | ICD-10-CM | POA: Diagnosis not present

## 2024-01-14 DIAGNOSIS — E1165 Type 2 diabetes mellitus with hyperglycemia: Secondary | ICD-10-CM | POA: Diagnosis not present

## 2024-01-14 DIAGNOSIS — N401 Enlarged prostate with lower urinary tract symptoms: Secondary | ICD-10-CM

## 2024-01-14 LAB — POCT GLYCOSYLATED HEMOGLOBIN (HGB A1C): Hemoglobin A1C: 6.1 % — AB (ref 4.0–5.6)

## 2024-01-14 LAB — MICROALBUMIN / CREATININE URINE RATIO
Creatinine,U: 83 mg/dL
Microalb Creat Ratio: 10.1 mg/g (ref 0.0–30.0)
Microalb, Ur: 0.8 mg/dL (ref 0.0–1.9)

## 2024-01-14 NOTE — Addendum Note (Signed)
 Addended by: LADONNA INOCENTE SAILOR on: 01/14/2024 11:48 AM   Modules accepted: Orders

## 2024-01-14 NOTE — Progress Notes (Signed)
 Subjective:    Patient ID: Dean Alvarado, male    DOB: 1955/07/07, 68 y.o.   MRN: [id number]  HPI Here to follow up on issues. He has no complaints today. His A1c today is 6.1%. His BP has been stable. He had a normal eye exam in February. His anxiety is well controlled.    Review of Systems  Constitutional: Negative.   HENT: Negative.    Eyes: Negative.   Respiratory: Negative.    Cardiovascular: Negative.   Gastrointestinal: Negative.   Genitourinary: Negative.   Musculoskeletal: Negative.   Skin: Negative.   Neurological: Negative.   Psychiatric/Behavioral: Negative.         Objective:   Physical Exam Constitutional:      General: He is not in acute distress.    Appearance: Normal appearance. He is well-developed. He is not diaphoretic.  HENT:     Head: Normocephalic and atraumatic.     Right Ear: External ear normal.     Left Ear: External ear normal.     Nose: Nose normal.     Mouth/Throat:     Pharynx: No oropharyngeal exudate.  Eyes:     General: No scleral icterus.       Right eye: No discharge.        Left eye: No discharge.     Conjunctiva/sclera: Conjunctivae normal.     Pupils: Pupils are equal, round, and reactive to light.  Neck:     Thyroid : No thyromegaly.     Vascular: No JVD.     Trachea: No tracheal deviation.  Cardiovascular:     Rate and Rhythm: Normal rate and regular rhythm.     Pulses: Normal pulses.     Heart sounds: Normal heart sounds. No murmur heard.    No friction rub. No gallop.  Pulmonary:     Effort: Pulmonary effort is normal. No respiratory distress.     Breath sounds: Normal breath sounds. No wheezing or rales.  Chest:     Chest wall: No tenderness.  Abdominal:     General: Bowel sounds are normal. There is no distension.     Palpations: Abdomen is soft. There is no mass.     Tenderness: There is no abdominal tenderness. There is no guarding or rebound.  Genitourinary:    Penis: Normal. No tenderness.      Testes:  Normal.     Prostate: Normal.     Rectum: Normal. Guaiac result negative.  Musculoskeletal:        General: No tenderness. Normal range of motion.     Cervical back: Neck supple.  Lymphadenopathy:     Cervical: No cervical adenopathy.  Skin:    General: Skin is warm and dry.     Coloration: Skin is not pale.     Findings: No erythema or rash.  Neurological:     General: No focal deficit present.     Mental Status: He is alert and oriented to person, place, and time.     Cranial Nerves: No cranial nerve deficit.     Motor: No abnormal muscle tone.     Coordination: Coordination normal.     Deep Tendon Reflexes: Reflexes are normal and symmetric. Reflexes normal.  Psychiatric:        Mood and Affect: Mood normal.        Behavior: Behavior normal.        Thought Content: Thought content normal.        Judgment: Judgment normal.  Assessment & Plan:  His type 2 diabetes with hyperglycemia and HTN are stable. His anxiety is stable. We will get labs to check a urine microalbumin, lipids, etc. He plans to have a colonoscopy in November. I personally spent a total of 34 minutes in the care of the patient today including performing a medically appropriate exam/evaluation and counseling and educating.  Garnette Olmsted, MD

## 2024-01-22 ENCOUNTER — Encounter: Payer: Self-pay | Admitting: Family Medicine

## 2024-02-05 NOTE — Progress Notes (Signed)
 Dean Alvarado                                          MRN: [id number]   02/05/2024   The VBCI Quality Team Specialist reviewed this patient medical record for the purposes of chart review for care gap closure. The following were reviewed: abstraction for care gap closure-kidney health evaluation for diabetes:eGFR  and uACR.    VBCI Quality Team

## 2024-03-13 ENCOUNTER — Other Ambulatory Visit: Payer: Self-pay | Admitting: Family Medicine

## 2024-04-07 ENCOUNTER — Ambulatory Visit: Payer: Self-pay | Admitting: Family Medicine

## 2024-04-07 ENCOUNTER — Encounter: Payer: Self-pay | Admitting: Family Medicine

## 2024-04-07 ENCOUNTER — Ambulatory Visit: Admitting: Family Medicine

## 2024-04-07 VITALS — BP 126/80 | HR 83 | Temp 99.0°F | Wt 214.0 lb

## 2024-04-07 DIAGNOSIS — N138 Other obstructive and reflux uropathy: Secondary | ICD-10-CM

## 2024-04-07 DIAGNOSIS — N401 Enlarged prostate with lower urinary tract symptoms: Secondary | ICD-10-CM

## 2024-04-07 DIAGNOSIS — E1165 Type 2 diabetes mellitus with hyperglycemia: Secondary | ICD-10-CM

## 2024-04-07 DIAGNOSIS — I10 Essential (primary) hypertension: Secondary | ICD-10-CM | POA: Diagnosis not present

## 2024-04-07 DIAGNOSIS — E119 Type 2 diabetes mellitus without complications: Secondary | ICD-10-CM

## 2024-04-07 DIAGNOSIS — N481 Balanitis: Secondary | ICD-10-CM

## 2024-04-07 LAB — BASIC METABOLIC PANEL WITH GFR
BUN: 14 mg/dL (ref 6–23)
CO2: 26 meq/L (ref 19–32)
Calcium: 9.3 mg/dL (ref 8.4–10.5)
Chloride: 97 meq/L (ref 96–112)
Creatinine, Ser: 0.76 mg/dL (ref 0.40–1.50)
GFR: 92.01 mL/min (ref >60.00)
Glucose, Bld: 155 mg/dL — ABNORMAL HIGH (ref 70–99)
Potassium: 4.3 meq/L (ref 3.5–5.1)
Sodium: 132 meq/L — ABNORMAL LOW (ref 135–145)

## 2024-04-07 LAB — LIPID PANEL
Cholesterol: 129 mg/dL (ref 28–200)
HDL: 52.6 mg/dL (ref >39.00)
LDL Cholesterol: 57 mg/dL (ref 10–99)
NonHDL: 76.8
Total CHOL/HDL Ratio: 2
Triglycerides: 100 mg/dL (ref 10.0–149.0)
VLDL: 20 mg/dL (ref 0.0–40.0)

## 2024-04-07 LAB — CBC WITH DIFFERENTIAL/PLATELET
Basophils Absolute: 0.1 K/uL (ref 0.0–0.1)
Basophils Relative: 1.1 % (ref 0.0–3.0)
Eosinophils Absolute: 0.4 K/uL (ref 0.0–0.7)
Eosinophils Relative: 6 % — ABNORMAL HIGH (ref 0.0–5.0)
HCT: 39.8 % (ref 39.0–52.0)
Hemoglobin: 13.8 g/dL (ref 13.0–17.0)
Lymphocytes Relative: 19.9 % (ref 12.0–46.0)
Lymphs Abs: 1.4 K/uL (ref 0.7–4.0)
MCHC: 34.8 g/dL (ref 30.0–36.0)
MCV: 88.2 fl (ref 78.0–100.0)
Monocytes Absolute: 0.8 K/uL (ref 0.1–1.0)
Monocytes Relative: 11.7 % (ref 3.0–12.0)
Neutro Abs: 4.3 K/uL (ref 1.4–7.7)
Neutrophils Relative %: 61.3 % (ref 43.0–77.0)
Platelets: 338 K/uL (ref 150.0–400.0)
RBC: 4.51 Mil/uL (ref 4.22–5.81)
RDW: 13 % (ref 11.5–15.5)
WBC: 7 K/uL (ref 4.0–10.5)

## 2024-04-07 LAB — POCT GLYCOSYLATED HEMOGLOBIN (HGB A1C): Hemoglobin A1C: 6.7 % — AB (ref 4.0–5.6)

## 2024-04-07 LAB — TSH: TSH: 1.79 u[IU]/mL (ref 0.35–5.50)

## 2024-04-07 LAB — HEPATIC FUNCTION PANEL
ALT: 37 U/L (ref 3–53)
AST: 22 U/L (ref 5–37)
Albumin: 4.6 g/dL (ref 3.5–5.2)
Alkaline Phosphatase: 71 U/L (ref 39–117)
Bilirubin, Direct: 0.1 mg/dL (ref 0.1–0.3)
Total Bilirubin: 0.6 mg/dL (ref 0.2–1.2)
Total Protein: 7.4 g/dL (ref 6.0–8.3)

## 2024-04-07 LAB — PSA: PSA: 1.13 ng/mL (ref 0.10–4.00)

## 2024-04-07 MED ORDER — KETOCONAZOLE 2 % EX CREA: 1.0000 | TOPICAL_CREAM | Freq: Every day | CUTANEOUS | 1 refills | Status: AC

## 2024-04-07 NOTE — Progress Notes (Signed)
° °  Subjective:    Patient ID: Dean Alvarado, male    DOB: 06-Sep-1955, 68 y.o.   MRN: [id number]  HPI Here to follow up on type 2 diabetes and HTN. He has one concern today. For the pasy 2 months he has had some red areas on the penis that won't go away. He has been applying Neosporin with no relief. These spots cause no symptoms. His A1c today is 6.7%.    Review of Systems  Constitutional: Negative.   Respiratory: Negative.    Cardiovascular: Negative.   Skin:  Positive for rash.       Objective:   Physical Exam Constitutional:      Appearance: Normal appearance.  Cardiovascular:     Rate and Rhythm: Normal rate and regular rhythm.     Pulses: Normal pulses.     Heart sounds: Normal heart sounds.  Pulmonary:     Effort: Pulmonary effort is normal.     Breath sounds: Normal breath sounds.  Genitourinary:    Comments: The glans of the penis has 2 areas of macular erythema, and one is somewhat scaly. No ulcerating lesions.  Neurological:     Mental Status: He is alert.           Assessment & Plan:  His type 2 diabetes and HTN are stable. The penile rash is consistent with Candidiasis, so we will treat with Ketoconazole  cream BID. Another possible diagnosis is psoriasis, so he will return if this does not respond in 2 weeks.  Garnette Olmsted, MD

## 2024-04-19 ENCOUNTER — Encounter: Payer: Self-pay | Admitting: Family Medicine

## 2024-04-19 DIAGNOSIS — R21 Rash and other nonspecific skin eruption: Secondary | ICD-10-CM

## 2024-04-21 NOTE — Telephone Encounter (Signed)
 Ok to place Dermatology referral, please advise

## 2024-04-21 NOTE — Telephone Encounter (Signed)
 I did the referral

## 2024-04-28 ENCOUNTER — Ambulatory Visit: Admitting: Physician Assistant

## 2024-04-28 ENCOUNTER — Encounter: Payer: Self-pay | Admitting: Physician Assistant

## 2024-04-28 VITALS — BP 144/84

## 2024-04-28 DIAGNOSIS — L309 Dermatitis, unspecified: Secondary | ICD-10-CM

## 2024-04-28 MED ORDER — TRIAMCINOLONE ACETONIDE 0.1 % EX OINT: 1.0000 | TOPICAL_OINTMENT | Freq: Two times a day (BID) | CUTANEOUS | 0 refills | Status: AC

## 2024-04-28 MED ORDER — TRIAMCINOLONE ACETONIDE 0.1 % EX OINT: 1.0000 | TOPICAL_OINTMENT | Freq: Two times a day (BID) | CUTANEOUS | 0 refills | Status: DC

## 2024-04-28 NOTE — Progress Notes (Signed)
" ° °  New Patient Visit   Subjective  Dean Alvarado is a 69 y.o. male NEW PATIENT who presents for the following: Red blotches of penis for more than 2 months. His PCP thought it may be fungal and prescribed ketoconazole  2% cream but it has not helped. They are not symptomatic - no itching, burning or stinging. Patient IS diabetic.       The following portions of the chart were reviewed this encounter and updated as appropriate: medications, allergies, medical history  Review of Systems:  No other skin or systemic complaints except as noted in HPI or Assessment and Plan.  Objective  Well appearing patient in no apparent distress; mood and affect are within normal limits.   A focused examination was performed of the following areas: Penis   Relevant exam findings are noted in the Assessment and Plan.    Assessment & Plan   DERMATITIS UNSPECIFIED - Allergic Contact Derm vs Lichen Planus vs candidiasis   Exam: 2 well demarcated erythematous patches on glans of penis (circumscribed patient)   Treatment Plan: TMC 0.1% oint - Apply to affected area twice daily x 2 weeks. If not resolved after 2 weeks, send a MyChart message.  DERMATITIS, UNSPECIFIED    Return if symptoms worsen or fail to improve.  I, Roseline Hutchinson, CMA, am acting as scribe for Laquanna Veazey K, PA-C .   Documentation: I have reviewed the above documentation for accuracy and completeness, and I agree with the above.  Allanah Mcfarland K, PA-C    "

## 2024-04-28 NOTE — Patient Instructions (Signed)

## 2024-05-11 ENCOUNTER — Other Ambulatory Visit: Payer: Self-pay | Admitting: Family Medicine

## 2024-05-12 ENCOUNTER — Encounter: Payer: Self-pay | Admitting: Physician Assistant

## 2024-05-12 MED ORDER — FLUOXETINE HCL 10 MG PO CAPS: 10.0000 mg | ORAL_CAPSULE | Freq: Every day | ORAL | 3 refills | Status: AC

## 2024-05-12 MED ORDER — FLUOXETINE HCL 20 MG PO CAPS: 20.0000 mg | ORAL_CAPSULE | Freq: Every day | ORAL | 3 refills | Status: AC

## 2024-05-12 NOTE — Telephone Encounter (Signed)
 Done

## 2024-05-13 ENCOUNTER — Other Ambulatory Visit: Payer: Self-pay | Admitting: Family Medicine

## 2024-05-13 DIAGNOSIS — E1165 Type 2 diabetes mellitus with hyperglycemia: Secondary | ICD-10-CM

## 2024-05-26 ENCOUNTER — Other Ambulatory Visit: Payer: Self-pay | Admitting: Family Medicine
